# Patient Record
Sex: Female | Born: 1940 | Race: White | Hispanic: No | Smoking: Never smoker
Health system: Southern US, Community
[De-identification: ages and names within clinical notes are randomized; demographics above are authoritative.]

## PROBLEM LIST (undated history)

## (undated) DIAGNOSIS — I639 Cerebral infarction, unspecified: Secondary | ICD-10-CM

## (undated) DIAGNOSIS — I10 Essential (primary) hypertension: Secondary | ICD-10-CM

## (undated) DIAGNOSIS — F32A Depression, unspecified: Secondary | ICD-10-CM

## (undated) DIAGNOSIS — F329 Major depressive disorder, single episode, unspecified: Secondary | ICD-10-CM

## (undated) DIAGNOSIS — R011 Cardiac murmur, unspecified: Secondary | ICD-10-CM

## (undated) DIAGNOSIS — T7840XA Allergy, unspecified, initial encounter: Secondary | ICD-10-CM

## (undated) DIAGNOSIS — M1990 Unspecified osteoarthritis, unspecified site: Secondary | ICD-10-CM

## (undated) DIAGNOSIS — R51 Headache: Secondary | ICD-10-CM

## (undated) DIAGNOSIS — K589 Irritable bowel syndrome without diarrhea: Secondary | ICD-10-CM

## (undated) DIAGNOSIS — R55 Syncope and collapse: Secondary | ICD-10-CM

## (undated) DIAGNOSIS — Z923 Personal history of irradiation: Secondary | ICD-10-CM

## (undated) DIAGNOSIS — E119 Type 2 diabetes mellitus without complications: Secondary | ICD-10-CM

## (undated) DIAGNOSIS — Z86010 Personal history of colonic polyps: Secondary | ICD-10-CM

## (undated) DIAGNOSIS — K219 Gastro-esophageal reflux disease without esophagitis: Secondary | ICD-10-CM

## (undated) DIAGNOSIS — N390 Urinary tract infection, site not specified: Secondary | ICD-10-CM

## (undated) DIAGNOSIS — G4730 Sleep apnea, unspecified: Secondary | ICD-10-CM

## (undated) DIAGNOSIS — H269 Unspecified cataract: Secondary | ICD-10-CM

## (undated) DIAGNOSIS — C44320 Squamous cell carcinoma of skin of unspecified parts of face: Secondary | ICD-10-CM

## (undated) DIAGNOSIS — E669 Obesity, unspecified: Secondary | ICD-10-CM

## (undated) DIAGNOSIS — H33319 Horseshoe tear of retina without detachment, unspecified eye: Secondary | ICD-10-CM

## (undated) DIAGNOSIS — R42 Dizziness and giddiness: Secondary | ICD-10-CM

## (undated) DIAGNOSIS — F419 Anxiety disorder, unspecified: Secondary | ICD-10-CM

## (undated) DIAGNOSIS — T8859XA Other complications of anesthesia, initial encounter: Secondary | ICD-10-CM

## (undated) DIAGNOSIS — I499 Cardiac arrhythmia, unspecified: Secondary | ICD-10-CM

## (undated) DIAGNOSIS — L309 Dermatitis, unspecified: Secondary | ICD-10-CM

## (undated) DIAGNOSIS — Z8619 Personal history of other infectious and parasitic diseases: Secondary | ICD-10-CM

## (undated) DIAGNOSIS — E785 Hyperlipidemia, unspecified: Secondary | ICD-10-CM

## (undated) DIAGNOSIS — D649 Anemia, unspecified: Secondary | ICD-10-CM

## (undated) DIAGNOSIS — J45909 Unspecified asthma, uncomplicated: Secondary | ICD-10-CM

## (undated) DIAGNOSIS — K8020 Calculus of gallbladder without cholecystitis without obstruction: Secondary | ICD-10-CM

## (undated) DIAGNOSIS — H3530 Unspecified macular degeneration: Secondary | ICD-10-CM

## (undated) DIAGNOSIS — T4145XA Adverse effect of unspecified anesthetic, initial encounter: Secondary | ICD-10-CM

## (undated) HISTORY — PX: APPENDECTOMY: SHX54

## (undated) HISTORY — DX: Irritable bowel syndrome, unspecified: K58.9

## (undated) HISTORY — DX: Syncope and collapse: R55

## (undated) HISTORY — DX: Cardiac murmur, unspecified: R01.1

## (undated) HISTORY — DX: Allergy, unspecified, initial encounter: T78.40XA

## (undated) HISTORY — DX: Obesity, unspecified: E66.9

## (undated) HISTORY — DX: Unspecified macular degeneration: H35.30

## (undated) HISTORY — DX: Horseshoe tear of retina without detachment, unspecified eye: H33.319

## (undated) HISTORY — DX: Gastro-esophageal reflux disease without esophagitis: K21.9

## (undated) HISTORY — PX: MRI: SHX5353

## (undated) HISTORY — DX: Anxiety disorder, unspecified: F41.9

## (undated) HISTORY — DX: Hyperlipidemia, unspecified: E78.5

## (undated) HISTORY — DX: Type 2 diabetes mellitus without complications: E11.9

## (undated) HISTORY — DX: Unspecified osteoarthritis, unspecified site: M19.90

## (undated) HISTORY — DX: Major depressive disorder, single episode, unspecified: F32.9

## (undated) HISTORY — PX: CARPAL TUNNEL RELEASE: SHX101

## (undated) HISTORY — DX: Calculus of gallbladder without cholecystitis without obstruction: K80.20

## (undated) HISTORY — PX: CHOLECYSTECTOMY: SHX55

## (undated) HISTORY — DX: Cerebral infarction, unspecified: I63.9

## (undated) HISTORY — DX: Anemia, unspecified: D64.9

## (undated) HISTORY — DX: Depression, unspecified: F32.A

## (undated) HISTORY — DX: Squamous cell carcinoma of skin of unspecified parts of face: C44.320

## (undated) HISTORY — PX: LUMBAR DISC SURGERY: SHX700

## (undated) HISTORY — DX: Personal history of other infectious and parasitic diseases: Z86.19

## (undated) HISTORY — DX: Unspecified cataract: H26.9

## (undated) HISTORY — DX: Cardiac arrhythmia, unspecified: I49.9

## (undated) HISTORY — DX: Urinary tract infection, site not specified: N39.0

## (undated) HISTORY — PX: COLONOSCOPY: SHX174

## (undated) HISTORY — PX: UPPER GASTROINTESTINAL ENDOSCOPY: SHX188

## (undated) HISTORY — DX: Sleep apnea, unspecified: G47.30

## (undated) HISTORY — DX: Personal history of colonic polyps: Z86.010

## (undated) HISTORY — DX: Headache: R51

## (undated) HISTORY — PX: BREAST CYST ASPIRATION: SHX578

## (undated) HISTORY — DX: Unspecified asthma, uncomplicated: J45.909

## (undated) HISTORY — DX: Dermatitis, unspecified: L30.9

## (undated) HISTORY — PX: OTHER SURGICAL HISTORY: SHX169

## (undated) HISTORY — DX: Dizziness and giddiness: R42

## (undated) HISTORY — DX: Essential (primary) hypertension: I10

---

## 1976-05-18 HISTORY — PX: OTHER SURGICAL HISTORY: SHX169

## 1998-11-18 ENCOUNTER — Other Ambulatory Visit: Admission: RE | Admit: 1998-11-18 | Discharge: 1998-11-18 | Payer: Self-pay | Admitting: Family Medicine

## 1999-12-05 ENCOUNTER — Encounter: Payer: Self-pay | Admitting: Family Medicine

## 1999-12-05 ENCOUNTER — Encounter: Admission: RE | Admit: 1999-12-05 | Discharge: 1999-12-05 | Payer: Self-pay | Admitting: Family Medicine

## 1999-12-10 ENCOUNTER — Encounter: Admission: RE | Admit: 1999-12-10 | Discharge: 1999-12-10 | Payer: Self-pay | Admitting: Family Medicine

## 1999-12-10 ENCOUNTER — Encounter: Payer: Self-pay | Admitting: Family Medicine

## 1999-12-15 ENCOUNTER — Other Ambulatory Visit: Admission: RE | Admit: 1999-12-15 | Discharge: 1999-12-15 | Payer: Self-pay | Admitting: Family Medicine

## 2000-11-22 ENCOUNTER — Other Ambulatory Visit: Admission: RE | Admit: 2000-11-22 | Discharge: 2000-11-22 | Payer: Self-pay | Admitting: Family Medicine

## 2000-12-13 ENCOUNTER — Encounter: Payer: Self-pay | Admitting: Family Medicine

## 2000-12-13 ENCOUNTER — Encounter: Admission: RE | Admit: 2000-12-13 | Discharge: 2000-12-13 | Payer: Self-pay | Admitting: *Deleted

## 2001-03-09 HISTORY — PX: OTHER SURGICAL HISTORY: SHX169

## 2001-07-11 HISTORY — PX: OTHER SURGICAL HISTORY: SHX169

## 2001-11-30 ENCOUNTER — Other Ambulatory Visit: Admission: RE | Admit: 2001-11-30 | Discharge: 2001-11-30 | Payer: Self-pay | Admitting: Family Medicine

## 2001-12-14 ENCOUNTER — Encounter: Admission: RE | Admit: 2001-12-14 | Discharge: 2001-12-14 | Payer: Self-pay | Admitting: Family Medicine

## 2001-12-14 ENCOUNTER — Encounter: Payer: Self-pay | Admitting: Family Medicine

## 2001-12-19 ENCOUNTER — Encounter: Admission: RE | Admit: 2001-12-19 | Discharge: 2001-12-19 | Payer: Self-pay | Admitting: Family Medicine

## 2001-12-19 ENCOUNTER — Encounter: Payer: Self-pay | Admitting: Family Medicine

## 2002-07-25 ENCOUNTER — Encounter: Payer: Self-pay | Admitting: Neurosurgery

## 2002-07-28 ENCOUNTER — Encounter: Payer: Self-pay | Admitting: Neurosurgery

## 2002-07-28 ENCOUNTER — Inpatient Hospital Stay (HOSPITAL_COMMUNITY): Admission: RE | Admit: 2002-07-28 | Discharge: 2002-07-30 | Payer: Self-pay | Admitting: Neurosurgery

## 2002-09-05 ENCOUNTER — Encounter: Payer: Self-pay | Admitting: Neurosurgery

## 2002-09-05 ENCOUNTER — Encounter: Admission: RE | Admit: 2002-09-05 | Discharge: 2002-09-05 | Payer: Self-pay | Admitting: Neurosurgery

## 2002-11-07 ENCOUNTER — Encounter: Payer: Self-pay | Admitting: Neurosurgery

## 2002-11-07 ENCOUNTER — Encounter: Admission: RE | Admit: 2002-11-07 | Discharge: 2002-11-07 | Payer: Self-pay | Admitting: Neurosurgery

## 2003-01-17 ENCOUNTER — Encounter: Payer: Self-pay | Admitting: Family Medicine

## 2003-01-17 ENCOUNTER — Encounter: Admission: RE | Admit: 2003-01-17 | Discharge: 2003-01-17 | Payer: Self-pay | Admitting: Family Medicine

## 2003-01-29 ENCOUNTER — Other Ambulatory Visit: Admission: RE | Admit: 2003-01-29 | Discharge: 2003-01-29 | Payer: Self-pay | Admitting: Family Medicine

## 2003-02-21 ENCOUNTER — Encounter: Payer: Self-pay | Admitting: Internal Medicine

## 2003-03-19 HISTORY — PX: ESOPHAGOGASTRODUODENOSCOPY: SHX1529

## 2003-03-22 ENCOUNTER — Encounter: Payer: Self-pay | Admitting: Internal Medicine

## 2004-03-17 ENCOUNTER — Ambulatory Visit: Payer: Self-pay | Admitting: Family Medicine

## 2004-04-22 ENCOUNTER — Ambulatory Visit: Payer: Self-pay | Admitting: Family Medicine

## 2004-07-17 ENCOUNTER — Ambulatory Visit: Payer: Self-pay | Admitting: Internal Medicine

## 2004-08-01 ENCOUNTER — Ambulatory Visit: Payer: Self-pay | Admitting: Family Medicine

## 2004-11-03 ENCOUNTER — Ambulatory Visit: Payer: Self-pay | Admitting: Family Medicine

## 2004-11-19 ENCOUNTER — Ambulatory Visit: Payer: Self-pay | Admitting: Family Medicine

## 2005-01-29 ENCOUNTER — Ambulatory Visit: Payer: Self-pay | Admitting: Family Medicine

## 2005-03-05 ENCOUNTER — Ambulatory Visit: Payer: Self-pay | Admitting: Family Medicine

## 2005-03-24 ENCOUNTER — Ambulatory Visit: Payer: Self-pay | Admitting: Family Medicine

## 2005-05-18 DIAGNOSIS — G8929 Other chronic pain: Secondary | ICD-10-CM

## 2005-05-18 DIAGNOSIS — R519 Headache, unspecified: Secondary | ICD-10-CM

## 2005-05-18 DIAGNOSIS — R42 Dizziness and giddiness: Secondary | ICD-10-CM

## 2005-05-18 HISTORY — DX: Headache, unspecified: R51.9

## 2005-05-18 HISTORY — DX: Other chronic pain: G89.29

## 2005-05-18 HISTORY — DX: Dizziness and giddiness: R42

## 2005-05-26 ENCOUNTER — Ambulatory Visit: Payer: Self-pay | Admitting: Family Medicine

## 2005-09-15 HISTORY — PX: OTHER SURGICAL HISTORY: SHX169

## 2005-09-22 ENCOUNTER — Encounter: Payer: Self-pay | Admitting: Family Medicine

## 2005-09-22 ENCOUNTER — Ambulatory Visit: Payer: Self-pay | Admitting: Family Medicine

## 2005-09-22 ENCOUNTER — Other Ambulatory Visit: Admission: RE | Admit: 2005-09-22 | Discharge: 2005-09-22 | Payer: Self-pay | Admitting: Family Medicine

## 2005-09-22 LAB — CONVERTED CEMR LAB: Hgb A1c MFr Bld: 5.8 %

## 2005-10-02 ENCOUNTER — Encounter: Admission: RE | Admit: 2005-10-02 | Discharge: 2005-10-02 | Payer: Self-pay | Admitting: Family Medicine

## 2005-10-15 ENCOUNTER — Ambulatory Visit: Payer: Self-pay | Admitting: Family Medicine

## 2005-10-19 ENCOUNTER — Encounter: Admission: RE | Admit: 2005-10-19 | Discharge: 2005-10-19 | Payer: Self-pay | Admitting: Neurology

## 2006-02-17 ENCOUNTER — Ambulatory Visit (HOSPITAL_BASED_OUTPATIENT_CLINIC_OR_DEPARTMENT_OTHER): Admission: RE | Admit: 2006-02-17 | Discharge: 2006-02-17 | Payer: Self-pay | Admitting: *Deleted

## 2006-03-23 ENCOUNTER — Ambulatory Visit: Payer: Self-pay | Admitting: Family Medicine

## 2006-03-24 ENCOUNTER — Ambulatory Visit: Payer: Self-pay | Admitting: Family Medicine

## 2006-04-19 ENCOUNTER — Ambulatory Visit: Payer: Self-pay | Admitting: Family Medicine

## 2006-10-01 ENCOUNTER — Encounter: Payer: Self-pay | Admitting: Family Medicine

## 2006-10-01 DIAGNOSIS — M549 Dorsalgia, unspecified: Secondary | ICD-10-CM | POA: Insufficient documentation

## 2006-10-01 DIAGNOSIS — Z87898 Personal history of other specified conditions: Secondary | ICD-10-CM

## 2006-10-01 DIAGNOSIS — E785 Hyperlipidemia, unspecified: Secondary | ICD-10-CM | POA: Insufficient documentation

## 2006-10-01 DIAGNOSIS — M4800 Spinal stenosis, site unspecified: Secondary | ICD-10-CM | POA: Insufficient documentation

## 2006-10-01 DIAGNOSIS — I10 Essential (primary) hypertension: Secondary | ICD-10-CM | POA: Insufficient documentation

## 2006-10-01 DIAGNOSIS — K589 Irritable bowel syndrome without diarrhea: Secondary | ICD-10-CM

## 2006-10-01 DIAGNOSIS — M1990 Unspecified osteoarthritis, unspecified site: Secondary | ICD-10-CM | POA: Insufficient documentation

## 2006-10-01 DIAGNOSIS — D179 Benign lipomatous neoplasm, unspecified: Secondary | ICD-10-CM | POA: Insufficient documentation

## 2006-10-01 DIAGNOSIS — G5600 Carpal tunnel syndrome, unspecified upper limb: Secondary | ICD-10-CM | POA: Insufficient documentation

## 2006-10-01 DIAGNOSIS — N6019 Diffuse cystic mastopathy of unspecified breast: Secondary | ICD-10-CM

## 2006-10-14 ENCOUNTER — Ambulatory Visit: Payer: Self-pay | Admitting: Family Medicine

## 2006-10-15 LAB — CONVERTED CEMR LAB
ALT: 16 units/L (ref 0–40)
AST: 23 units/L (ref 0–37)
Basophils Absolute: 0 10*3/uL (ref 0.0–0.1)
Chloride: 104 meq/L (ref 96–112)
Cholesterol: 168 mg/dL (ref 0–200)
Creatinine, Ser: 0.8 mg/dL (ref 0.4–1.2)
Direct LDL: 84.3 mg/dL
Eosinophils Relative: 3.3 % (ref 0.0–5.0)
HCT: 36.2 % (ref 36.0–46.0)
HDL: 41.4 mg/dL (ref >39.0)
Hemoglobin: 12.8 g/dL (ref 12.0–15.0)
Lymphocytes Relative: 19.8 % (ref 12.0–46.0)
MCHC: 35.3 g/dL (ref 30.0–36.0)
MCV: 84.3 fL (ref 78.0–100.0)
Monocytes Relative: 7 % (ref 3.0–11.0)
Neutro Abs: 7.5 10*3/uL (ref 1.4–7.7)
RDW: 12.9 % (ref 11.5–14.6)
TSH: 1.92 microintl units/mL (ref 0.35–5.50)
Total Bilirubin: 0.6 mg/dL (ref 0.3–1.2)
Total CHOL/HDL Ratio: 4.1
Total Protein: 7.2 g/dL (ref 6.0–8.3)
VLDL: 50 mg/dL — ABNORMAL HIGH (ref 0–40)
WBC: 10.9 10*3/uL — ABNORMAL HIGH (ref 4.5–10.5)

## 2006-10-18 ENCOUNTER — Encounter: Admission: RE | Admit: 2006-10-18 | Discharge: 2006-10-18 | Payer: Self-pay | Admitting: Family Medicine

## 2006-10-27 ENCOUNTER — Encounter (INDEPENDENT_AMBULATORY_CARE_PROVIDER_SITE_OTHER): Payer: Self-pay | Admitting: *Deleted

## 2007-01-13 ENCOUNTER — Ambulatory Visit: Payer: Self-pay | Admitting: Family Medicine

## 2007-01-14 LAB — CONVERTED CEMR LAB
ALT: 18 units/L (ref 0–35)
Albumin: 3.7 g/dL (ref 3.5–5.2)
BUN: 16 mg/dL (ref 6–23)
Basophils Absolute: 0.1 10*3/uL (ref 0.0–0.1)
Basophils Relative: 0.7 % (ref 0.0–1.0)
CO2: 30 meq/L (ref 19–32)
Calcium: 9.6 mg/dL (ref 8.4–10.5)
Chloride: 102 meq/L (ref 96–112)
Creatinine, Ser: 1 mg/dL (ref 0.4–1.2)
Eosinophils Relative: 4.7 % (ref 0.0–5.0)
HCT: 34.9 % — ABNORMAL LOW (ref 36.0–46.0)
Hemoglobin: 12.1 g/dL (ref 12.0–15.0)
Hgb A1c MFr Bld: 6.3 % — ABNORMAL HIGH (ref 4.6–6.0)
MCHC: 34.8 g/dL (ref 30.0–36.0)
Monocytes Absolute: 0.6 10*3/uL (ref 0.2–0.7)
Neutro Abs: 5.6 10*3/uL (ref 1.4–7.7)
Platelets: 295 10*3/uL (ref 150–400)
Potassium: 4.1 meq/L (ref 3.5–5.1)
RDW: 12.9 % (ref 11.5–14.6)
Sodium: 140 meq/L (ref 135–145)
TSH: 1.98 microintl units/mL (ref 0.35–5.50)
WBC: 8.4 10*3/uL (ref 4.5–10.5)

## 2007-02-15 ENCOUNTER — Telehealth (INDEPENDENT_AMBULATORY_CARE_PROVIDER_SITE_OTHER): Payer: Self-pay | Admitting: *Deleted

## 2007-05-23 ENCOUNTER — Ambulatory Visit: Payer: Self-pay | Admitting: Family Medicine

## 2007-05-26 ENCOUNTER — Telehealth: Payer: Self-pay | Admitting: Family Medicine

## 2007-06-08 ENCOUNTER — Telehealth: Payer: Self-pay | Admitting: Family Medicine

## 2007-09-02 ENCOUNTER — Telehealth: Payer: Self-pay | Admitting: Family Medicine

## 2007-09-08 ENCOUNTER — Ambulatory Visit: Payer: Self-pay | Admitting: Family Medicine

## 2007-09-09 LAB — CONVERTED CEMR LAB
ALT: 20 units/L (ref 0–35)
Albumin: 4 g/dL (ref 3.5–5.2)
BUN: 18 mg/dL (ref 6–23)
Bilirubin, Direct: 0.1 mg/dL (ref 0.0–0.3)
Calcium: 9.8 mg/dL (ref 8.4–10.5)
Creatinine, Ser: 1 mg/dL (ref 0.4–1.2)
GFR calc Af Amer: 72 mL/min
GFR calc non Af Amer: 59 mL/min
Glucose, Bld: 100 mg/dL — ABNORMAL HIGH (ref 70–99)
Hgb A1c MFr Bld: 6.2 % — ABNORMAL HIGH (ref 4.6–6.0)
Phosphorus: 4 mg/dL (ref 2.3–4.6)
Potassium: 4 meq/L (ref 3.5–5.1)
Sodium: 141 meq/L (ref 135–145)
Total Protein: 7 g/dL (ref 6.0–8.3)

## 2007-10-26 ENCOUNTER — Ambulatory Visit: Payer: Self-pay | Admitting: Family Medicine

## 2007-10-26 DIAGNOSIS — R7303 Prediabetes: Secondary | ICD-10-CM

## 2007-11-14 ENCOUNTER — Encounter: Admission: RE | Admit: 2007-11-14 | Discharge: 2007-11-14 | Payer: Self-pay | Admitting: Family Medicine

## 2007-11-22 ENCOUNTER — Encounter: Admission: RE | Admit: 2007-11-22 | Discharge: 2007-11-22 | Payer: Self-pay | Admitting: Family Medicine

## 2007-11-23 ENCOUNTER — Encounter (INDEPENDENT_AMBULATORY_CARE_PROVIDER_SITE_OTHER): Payer: Self-pay | Admitting: *Deleted

## 2008-04-17 ENCOUNTER — Ambulatory Visit: Payer: Self-pay | Admitting: Family Medicine

## 2008-04-20 LAB — CONVERTED CEMR LAB
Albumin: 3.6 g/dL (ref 3.5–5.2)
BUN: 23 mg/dL (ref 6–23)
Calcium: 9.2 mg/dL (ref 8.4–10.5)
Creatinine, Ser: 1.1 mg/dL (ref 0.4–1.2)
Creatinine,U: 385.6 mg/dL
Eosinophils Absolute: 0.5 10*3/uL (ref 0.0–0.7)
Eosinophils Relative: 5.3 % — ABNORMAL HIGH (ref 0.0–5.0)
GFR calc Af Amer: 64 mL/min
GFR calc non Af Amer: 53 mL/min
Glucose, Bld: 104 mg/dL — ABNORMAL HIGH (ref 70–99)
HCT: 35.4 % — ABNORMAL LOW (ref 36.0–46.0)
HDL: 36.7 mg/dL — ABNORMAL LOW (ref >39.0)
Hgb A1c MFr Bld: 6.3 % — ABNORMAL HIGH (ref 4.6–6.0)
MCHC: 34.3 g/dL (ref 30.0–36.0)
Microalb Creat Ratio: 24.9 mg/g (ref 0.0–30.0)
Microalb, Ur: 9.6 mg/dL — ABNORMAL HIGH (ref 0.0–1.9)
Potassium: 3.7 meq/L (ref 3.5–5.1)
RBC: 4.19 M/uL (ref 3.87–5.11)
Total Bilirubin: 0.6 mg/dL (ref 0.3–1.2)
Total Protein: 6.9 g/dL (ref 6.0–8.3)
WBC: 10.2 10*3/uL (ref 4.5–10.5)

## 2008-04-23 ENCOUNTER — Ambulatory Visit: Payer: Self-pay | Admitting: Family Medicine

## 2008-04-25 ENCOUNTER — Encounter: Payer: Self-pay | Admitting: Family Medicine

## 2008-04-26 ENCOUNTER — Encounter: Payer: Self-pay | Admitting: Family Medicine

## 2008-08-15 ENCOUNTER — Telehealth: Payer: Self-pay | Admitting: Family Medicine

## 2008-10-24 ENCOUNTER — Ambulatory Visit: Payer: Self-pay | Admitting: Family Medicine

## 2008-10-26 ENCOUNTER — Telehealth (INDEPENDENT_AMBULATORY_CARE_PROVIDER_SITE_OTHER): Payer: Self-pay | Admitting: *Deleted

## 2008-10-26 LAB — CONVERTED CEMR LAB
AST: 21 units/L (ref 0–37)
Albumin: 3.5 g/dL (ref 3.5–5.2)
BUN: 20 mg/dL (ref 6–23)
Calcium: 9 mg/dL (ref 8.4–10.5)
Creatinine, Ser: 1 mg/dL (ref 0.4–1.2)
Glucose, Bld: 104 mg/dL — ABNORMAL HIGH (ref 70–99)
Hgb A1c MFr Bld: 6.4 % (ref 4.6–6.5)
Phosphorus: 3.6 mg/dL (ref 2.3–4.6)
Sodium: 141 meq/L (ref 135–145)

## 2008-11-30 ENCOUNTER — Telehealth: Payer: Self-pay | Admitting: Family Medicine

## 2009-04-24 ENCOUNTER — Ambulatory Visit: Payer: Self-pay | Admitting: Family Medicine

## 2009-04-25 LAB — CONVERTED CEMR LAB
ALT: 17 units/L (ref 0–35)
AST: 22 units/L (ref 0–37)
Albumin: 3.7 g/dL (ref 3.5–5.2)
BUN: 20 mg/dL (ref 6–23)
Basophils Absolute: 0.1 10*3/uL (ref 0.0–0.1)
Basophils Relative: 0.8 % (ref 0.0–3.0)
Glucose, Bld: 102 mg/dL — ABNORMAL HIGH (ref 70–99)
Hemoglobin: 11.5 g/dL — ABNORMAL LOW (ref 12.0–15.0)
MCHC: 33.7 g/dL (ref 30.0–36.0)
MCV: 82.4 fL (ref 78.0–100.0)
Microalb, Ur: 1.6 mg/dL (ref 0.0–1.9)
Neutro Abs: 7 10*3/uL (ref 1.4–7.7)
Phosphorus: 4.2 mg/dL (ref 2.3–4.6)
Potassium: 4 meq/L (ref 3.5–5.1)
RDW: 13.9 % (ref 11.5–14.6)
Sodium: 144 meq/L (ref 135–145)
TSH: 2.14 microintl units/mL (ref 0.35–5.50)
Triglycerides: 245 mg/dL — ABNORMAL HIGH (ref 0.0–149.0)

## 2009-04-30 ENCOUNTER — Ambulatory Visit: Payer: Self-pay | Admitting: Family Medicine

## 2009-04-30 ENCOUNTER — Other Ambulatory Visit: Admission: RE | Admit: 2009-04-30 | Discharge: 2009-04-30 | Payer: Self-pay | Admitting: Family Medicine

## 2009-04-30 DIAGNOSIS — R413 Other amnesia: Secondary | ICD-10-CM

## 2009-04-30 LAB — HM PAP SMEAR

## 2009-05-07 ENCOUNTER — Ambulatory Visit: Payer: Self-pay | Admitting: Family Medicine

## 2009-05-08 ENCOUNTER — Encounter (INDEPENDENT_AMBULATORY_CARE_PROVIDER_SITE_OTHER): Payer: Self-pay | Admitting: *Deleted

## 2009-05-08 ENCOUNTER — Telehealth: Payer: Self-pay | Admitting: Family Medicine

## 2009-05-14 ENCOUNTER — Encounter: Admission: RE | Admit: 2009-05-14 | Discharge: 2009-05-14 | Payer: Self-pay | Admitting: Family Medicine

## 2009-05-15 ENCOUNTER — Ambulatory Visit: Payer: Self-pay | Admitting: Family Medicine

## 2009-05-15 DIAGNOSIS — F419 Anxiety disorder, unspecified: Secondary | ICD-10-CM | POA: Insufficient documentation

## 2009-05-15 DIAGNOSIS — F329 Major depressive disorder, single episode, unspecified: Secondary | ICD-10-CM | POA: Insufficient documentation

## 2009-05-16 ENCOUNTER — Encounter: Payer: Self-pay | Admitting: Family Medicine

## 2009-05-20 ENCOUNTER — Encounter (INDEPENDENT_AMBULATORY_CARE_PROVIDER_SITE_OTHER): Payer: Self-pay | Admitting: *Deleted

## 2009-05-20 LAB — CONVERTED CEMR LAB
Folate: 12.5 ng/mL
Sed Rate: 51 mm/h — ABNORMAL HIGH
Vit D, 25-Hydroxy: 21 ng/mL — ABNORMAL LOW
Vitamin B-12: 193 pg/mL — ABNORMAL LOW

## 2009-05-20 LAB — HM MAMMOGRAPHY: HM Mammogram: NORMAL

## 2009-05-23 ENCOUNTER — Encounter: Payer: Self-pay | Admitting: Family Medicine

## 2009-05-24 ENCOUNTER — Ambulatory Visit: Payer: Self-pay | Admitting: Family Medicine

## 2009-05-24 DIAGNOSIS — D518 Other vitamin B12 deficiency anemias: Secondary | ICD-10-CM | POA: Insufficient documentation

## 2009-05-30 ENCOUNTER — Ambulatory Visit: Payer: Self-pay | Admitting: Family Medicine

## 2009-05-31 ENCOUNTER — Ambulatory Visit: Payer: Self-pay | Admitting: Pulmonary Disease

## 2009-05-31 DIAGNOSIS — G4733 Obstructive sleep apnea (adult) (pediatric): Secondary | ICD-10-CM | POA: Insufficient documentation

## 2009-06-05 ENCOUNTER — Ambulatory Visit: Payer: Self-pay | Admitting: Internal Medicine

## 2009-06-05 ENCOUNTER — Encounter (INDEPENDENT_AMBULATORY_CARE_PROVIDER_SITE_OTHER): Payer: Self-pay | Admitting: *Deleted

## 2009-06-05 LAB — CONVERTED CEMR LAB
Ferritin: 8.4 ng/mL — ABNORMAL LOW (ref 10.0–291.0)
Saturation Ratios: 6.2 % — ABNORMAL LOW (ref 20.0–50.0)

## 2009-06-06 ENCOUNTER — Ambulatory Visit: Payer: Self-pay | Admitting: Family Medicine

## 2009-06-07 ENCOUNTER — Ambulatory Visit: Payer: Self-pay | Admitting: Internal Medicine

## 2009-06-07 DIAGNOSIS — Z860101 Personal history of adenomatous and serrated colon polyps: Secondary | ICD-10-CM | POA: Insufficient documentation

## 2009-06-07 DIAGNOSIS — Z86010 Personal history of colonic polyps: Secondary | ICD-10-CM

## 2009-06-07 HISTORY — DX: Personal history of adenomatous and serrated colon polyps: Z86.0101

## 2009-06-07 HISTORY — DX: Personal history of colonic polyps: Z86.010

## 2009-06-10 ENCOUNTER — Ambulatory Visit (HOSPITAL_BASED_OUTPATIENT_CLINIC_OR_DEPARTMENT_OTHER): Admission: RE | Admit: 2009-06-10 | Discharge: 2009-06-10 | Payer: Self-pay | Admitting: Pulmonary Disease

## 2009-06-10 ENCOUNTER — Encounter: Payer: Self-pay | Admitting: Pulmonary Disease

## 2009-06-11 ENCOUNTER — Encounter (INDEPENDENT_AMBULATORY_CARE_PROVIDER_SITE_OTHER): Payer: Self-pay | Admitting: *Deleted

## 2009-06-12 ENCOUNTER — Telehealth: Payer: Self-pay | Admitting: Family Medicine

## 2009-06-12 ENCOUNTER — Ambulatory Visit: Payer: Self-pay | Admitting: Internal Medicine

## 2009-06-13 ENCOUNTER — Ambulatory Visit: Payer: Self-pay | Admitting: Family Medicine

## 2009-06-24 ENCOUNTER — Ambulatory Visit: Payer: Self-pay | Admitting: Internal Medicine

## 2009-06-25 ENCOUNTER — Encounter: Payer: Self-pay | Admitting: Internal Medicine

## 2009-06-26 ENCOUNTER — Ambulatory Visit: Payer: Self-pay | Admitting: Pulmonary Disease

## 2009-06-26 ENCOUNTER — Telehealth (INDEPENDENT_AMBULATORY_CARE_PROVIDER_SITE_OTHER): Payer: Self-pay | Admitting: *Deleted

## 2009-06-27 ENCOUNTER — Ambulatory Visit: Payer: Self-pay | Admitting: Family Medicine

## 2009-06-27 LAB — CONVERTED CEMR LAB: Vitamin B-12: 402 pg/mL (ref 211–911)

## 2009-07-03 ENCOUNTER — Telehealth: Payer: Self-pay | Admitting: Family Medicine

## 2009-07-03 ENCOUNTER — Ambulatory Visit: Payer: Self-pay | Admitting: Pulmonary Disease

## 2009-07-03 DIAGNOSIS — G2589 Other specified extrapyramidal and movement disorders: Secondary | ICD-10-CM | POA: Insufficient documentation

## 2009-07-09 ENCOUNTER — Ambulatory Visit: Payer: Self-pay | Admitting: Family Medicine

## 2009-07-09 DIAGNOSIS — D509 Iron deficiency anemia, unspecified: Secondary | ICD-10-CM

## 2009-07-16 HISTORY — PX: RETINAL TEAR REPAIR CRYOTHERAPY: SHX5304

## 2009-07-30 LAB — HM DIABETES EYE EXAM: HM Diabetic Eye Exam: NORMAL

## 2009-08-06 ENCOUNTER — Ambulatory Visit: Payer: Self-pay | Admitting: Family Medicine

## 2009-08-06 ENCOUNTER — Telehealth: Payer: Self-pay | Admitting: Family Medicine

## 2009-08-06 ENCOUNTER — Telehealth: Payer: Self-pay | Admitting: Internal Medicine

## 2009-08-07 ENCOUNTER — Ambulatory Visit: Payer: Self-pay | Admitting: Family Medicine

## 2009-08-11 LAB — CONVERTED CEMR LAB
Basophils Absolute: 0 10*3/uL (ref 0.0–0.1)
Basophils Relative: 0 % (ref 0.0–3.0)
Eosinophils Relative: 3.9 % (ref 0.0–5.0)
Ferritin: 14 ng/mL (ref 10.0–291.0)
Hemoglobin: 11.5 g/dL — ABNORMAL LOW (ref 12.0–15.0)
Lymphocytes Relative: 19.1 % (ref 12.0–46.0)
MCHC: 33 g/dL (ref 30.0–36.0)
Neutro Abs: 7.6 10*3/uL (ref 1.4–7.7)
Neutrophils Relative %: 72.7 % (ref 43.0–77.0)
Platelets: 306 10*3/uL (ref 150.0–400.0)
RBC: 4.12 M/uL (ref 3.87–5.11)
RDW: 16.4 % — ABNORMAL HIGH (ref 11.5–14.6)
Sed Rate: 43 mm/hr — ABNORMAL HIGH (ref 0–22)
WBC: 10.5 10*3/uL (ref 4.5–10.5)

## 2009-08-21 ENCOUNTER — Ambulatory Visit: Payer: Self-pay | Admitting: Family Medicine

## 2009-08-21 ENCOUNTER — Telehealth: Payer: Self-pay | Admitting: Family Medicine

## 2009-08-21 DIAGNOSIS — J309 Allergic rhinitis, unspecified: Secondary | ICD-10-CM

## 2009-08-26 LAB — CONVERTED CEMR LAB: Vitamin B-12: 439 pg/mL (ref 211–911)

## 2009-08-30 ENCOUNTER — Inpatient Hospital Stay (HOSPITAL_COMMUNITY): Admission: RE | Admit: 2009-08-30 | Discharge: 2009-08-31 | Payer: Self-pay | Admitting: Neurosurgery

## 2009-10-10 ENCOUNTER — Telehealth: Payer: Self-pay | Admitting: Family Medicine

## 2009-10-24 ENCOUNTER — Encounter (INDEPENDENT_AMBULATORY_CARE_PROVIDER_SITE_OTHER): Payer: Self-pay | Admitting: *Deleted

## 2009-10-30 ENCOUNTER — Encounter: Payer: Self-pay | Admitting: Family Medicine

## 2009-11-14 ENCOUNTER — Ambulatory Visit: Payer: Self-pay | Admitting: Family Medicine

## 2009-11-20 ENCOUNTER — Telehealth (INDEPENDENT_AMBULATORY_CARE_PROVIDER_SITE_OTHER): Payer: Self-pay

## 2009-11-27 ENCOUNTER — Encounter: Payer: Self-pay | Admitting: Family Medicine

## 2009-12-24 ENCOUNTER — Ambulatory Visit: Payer: Self-pay | Admitting: Family Medicine

## 2010-01-09 ENCOUNTER — Telehealth: Payer: Self-pay | Admitting: Family Medicine

## 2010-01-28 ENCOUNTER — Ambulatory Visit: Payer: Self-pay | Admitting: Family Medicine

## 2010-01-30 ENCOUNTER — Encounter: Payer: Self-pay | Admitting: Family Medicine

## 2010-03-11 ENCOUNTER — Ambulatory Visit: Payer: Self-pay | Admitting: Family Medicine

## 2010-03-13 ENCOUNTER — Telehealth: Payer: Self-pay | Admitting: Internal Medicine

## 2010-03-25 ENCOUNTER — Encounter (INDEPENDENT_AMBULATORY_CARE_PROVIDER_SITE_OTHER): Payer: Self-pay | Admitting: *Deleted

## 2010-03-28 ENCOUNTER — Encounter (INDEPENDENT_AMBULATORY_CARE_PROVIDER_SITE_OTHER): Payer: Self-pay | Admitting: *Deleted

## 2010-04-02 ENCOUNTER — Ambulatory Visit: Payer: Self-pay | Admitting: Internal Medicine

## 2010-04-03 ENCOUNTER — Telehealth: Payer: Self-pay | Admitting: Family Medicine

## 2010-04-15 ENCOUNTER — Ambulatory Visit: Payer: Self-pay | Admitting: Internal Medicine

## 2010-04-15 ENCOUNTER — Ambulatory Visit (HOSPITAL_COMMUNITY)
Admission: RE | Admit: 2010-04-15 | Discharge: 2010-04-15 | Payer: Self-pay | Source: Home / Self Care | Admitting: Internal Medicine

## 2010-04-15 LAB — HM COLONOSCOPY

## 2010-04-20 ENCOUNTER — Telehealth: Payer: Self-pay | Admitting: Internal Medicine

## 2010-05-23 ENCOUNTER — Ambulatory Visit
Admission: RE | Admit: 2010-05-23 | Discharge: 2010-05-23 | Payer: Self-pay | Source: Home / Self Care | Attending: Family Medicine | Admitting: Family Medicine

## 2010-06-17 ENCOUNTER — Ambulatory Visit: Admit: 2010-06-17 | Payer: Self-pay | Admitting: Family Medicine

## 2010-06-17 NOTE — Progress Notes (Signed)
Summary: ? about Vit B12 level  Phone Note Call from Patient Call back at [phone]   Caller: Patient Call For: Dalayla Part MD Summary of Call: Pt requested result on Vit B12 level and how to f/u. Vit B 12 level was 402 on 06/27/09.Please advise.  Initial call taken by: Lewanda Rife LPN,  July 03, 2009 9:56 AM  Follow-up for Phone Call        B12 level is improved I want her to take a generic B complex vit otc daily also to continue B12 shots once monthly - please schedule  Follow-up by: Pretty Part MD,  July 03, 2009 1:47 PM  Additional Follow-up for Phone Call Additional follow up Details #1::        Patient Advised. Appointment scheduled  07/09/2009 to follow up with MAT at patient's request.  Will get B-12 shot at that time. Additional Follow-up by: Delilah Shan CMA (AAMA),  July 03, 2009 4:47 PM    New/Updated Medications: * VITAMIN B12 INJECTION take as directed once monthly * GENERIC OTC B COMPLEX VITAMIN 1 by mouth once daily
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
## 2010-06-17 NOTE — Assessment & Plan Note (Signed)
Summary: B-12 INJ/TOWER/CLE   Nurse Visit   Allergies: 1)  ! * Omega 3 Oils 2)  ! * Calcium 3)  ! * Vit D 4)  ! Neurontin 5)  ! Requip (Ropinirole Hcl) 6)  ! * Nasonex 7)  Ampicillin 8)  Codeine 9)  * Zyrtec 10)  * Phenylpropcinalone  Medication Administration  Injection # 1:    Medication: Vit B12 1000 mcg    Diagnosis: ANEMIA, B12 DEFICIENCY (ICD-281.1)    Route: IM    Site: L deltoid    Exp Date: 10/17/2011    Lot #: [id number]    Mfr: American Regent    Patient tolerated injection without complications    Given by: Sydell Axon LPN (March 11, 2010 11:51 AM)  Orders Added: 1)  Vit B12 1000 mcg [J3420] 2)  Admin of Therapeutic Inj  intramuscular or subcutaneous [[id number]]   Medication Administration  Injection # 1:    Medication: Vit B12 1000 mcg    Diagnosis: ANEMIA, B12 DEFICIENCY (ICD-281.1)    Route: IM    Site: L deltoid    Exp Date: 10/17/2011    Lot #: [id number]    Mfr: American Regent    Patient tolerated injection without complications    Given by: Sydell Axon LPN (March 11, 2010 11:51 AM)  Orders Added: 1)  Vit B12 1000 mcg [J3420] 2)  Admin of Therapeutic Inj  intramuscular or subcutaneous [[id number]]

## 2010-06-17 NOTE — Procedures (Signed)
Summary: Preparation for Colonoscopy / [facility]  Preparation for Colonoscopy / [facility]   Imported By: Lennie Odor 04/08/2010 09:09:51  _____________________________________________________________________  External Attachment:    Type:   Image     Comment:   External Document

## 2010-06-17 NOTE — Assessment & Plan Note (Signed)
Summary: B-12 INJ/TOWER/CLE   Nurse Visit   Allergies: 1)  ! * Omega 3 Oils 2)  ! * Calcium 3)  ! * Vit D 4)  ! Neurontin 5)  ! Requip (Ropinirole Hcl) 6)  ! * Nasonex 7)  Ampicillin 8)  Codeine 9)  * Zyrtec 10)  * Phenylpropcinalone  Medication Administration  Injection # 1:    Medication: Vit B12 1000 mcg    Diagnosis: ANEMIA, B12 DEFICIENCY (ICD-281.1)    Route: IM    Site: L deltoid    Exp Date: 04/18/2011    Lot #: [id number]    Mfr: American Regent    Patient tolerated injection without complications    Given by: Benny Lennert CMA (AAMA) (December 24, 2009 10:52 AM)  Orders Added: 1)  Admin of Therapeutic Inj  intramuscular or subcutaneous [[id number]]

## 2010-06-17 NOTE — Progress Notes (Signed)
Summary: refill request for lotrisone  Phone Note Refill Request Message from:  Fax from Pharmacy  Refills Requested: Medication #1:  LOTRISONE 1-0.05 % CREA apply to aff area once daily as needed Faxed request from [place].  Initial call taken by: Lowella Petties CMA,  January 09, 2010 10:11 AM  Follow-up for Phone Call        px written on EMR for call in  Follow-up by: Al Part MD,  January 09, 2010 10:46 AM  Additional Follow-up for Phone Call Additional follow up Details #1::        Rx sent to pharmacy electronically. Additional Follow-up by: Linde Gillis CMA Duncan Dull),  January 09, 2010 12:50 PM    Prescriptions: LOTRISONE 1-0.05 % CREA (CLOTRIMAZOLE-BETAMETHASONE) apply to aff area once daily as needed  #1 medium x 0   Entered by:   Linde Gillis CMA (AAMA)   Authorized by:   Shellye Part MD   Signed by:   Linde Gillis CMA (AAMA) on 01/09/2010   Method used:   Electronically to        [facility]* (retail)       [address]       [place], [place]  [address]       Ph: [phone]       Fax: [phone]   RxID:   [id number]
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
## 2010-06-17 NOTE — Progress Notes (Signed)
Summary: refill request for flexeril  Phone Note Refill Request Message from:  Fax from Pharmacy  Refills Requested: Medication #1:  FLEXERIL 10 MG TABS take one half to one by mouth three times a day prn   Last Refilled: 03/14/2009 Faxed request from [facility].  Initial call taken by: Lowella Petties CMA,  June 12, 2009 4:47 PM  Follow-up for Phone Call        px written on EMR for call in  Follow-up by: Tenae Part MD,  June 12, 2009 7:59 PM    Prescriptions: FLEXERIL 10 MG TABS (CYCLOBENZAPRINE HCL) take one half to one by mouth three times a day prn  #30 x 2   Entered by:   Lowella Petties CMA   Authorized by:   Taja Part MD   Signed by:   Lowella Petties CMA on 06/13/2009   Method used:   Electronically to        [facility]* (retail)       [address]       [place], [place]  [address]       Ph: [phone]       Fax: [phone]   RxID:   [id number] FLEXERIL 10 MG TABS (CYCLOBENZAPRINE HCL) take one half to one by mouth three times a day prn  #30 x 2   Entered and Authorized by:   Deonne Part MD   Signed by:   Marlisha Part MD on 06/12/2009   Method used:   Telephoned to ...       [facility]* (retail)       [address]       [place], [place]  [address]       Ph: [phone]       Fax: [phone]   RxID:   [id number]

## 2010-06-17 NOTE — Assessment & Plan Note (Signed)
Summary: B-12 SHOT   Nurse Visit   Allergies: 1)  ! * Omega 3 Oils 2)  ! * Calcium 3)  ! * Vit D 4)  Ampicillin 5)  Codeine 6)  * Zyrtec 7)  * Phenylpropcinalone  Medication Administration  Injection # 1:    Medication: Vit B12 1000 mcg    Diagnosis: ANEMIA, B12 DEFICIENCY (ICD-281.1)    Route: IM    Site: R deltoid    Exp Date: 09/2010    Lot #: [id number]    Mfr: American Regent    Patient tolerated injection without complications    Given by: Lowella Petties CMA (May 30, 2009 12:11 PM)  Orders Added: 1)  Vit B12 1000 mcg [J3420] 2)  Admin of Therapeutic Inj  intramuscular or subcutaneous [[id number]]   Medication Administration  Injection # 1:    Medication: Vit B12 1000 mcg    Diagnosis: ANEMIA, B12 DEFICIENCY (ICD-281.1)    Route: IM    Site: R deltoid    Exp Date: 09/2010    Lot #: [id number]    Mfr: American Regent    Patient tolerated injection without complications    Given by: Lowella Petties CMA (May 30, 2009 12:11 PM)  Orders Added: 1)  Vit B12 1000 mcg [J3420] 2)  Admin of Therapeutic Inj  intramuscular or subcutaneous [[id number]]

## 2010-06-17 NOTE — Miscellaneous (Signed)
Summary: one touch test strips  Medications Added ONETOUCH ULTRA TEST  STRP (GLUCOSE BLOOD) check blood sugar once daily as directed       Clinical Lists Changes  Medications: Added new medication of ONETOUCH ULTRA TEST  STRP (GLUCOSE BLOOD) check blood sugar once daily as directed - Signed Rx of ONETOUCH ULTRA TEST  STRP (GLUCOSE BLOOD) check blood sugar once daily as directed;  #100 x prn;  Signed;  Entered by: Lowella Petties CMA;  Authorized by: Nyelah Part MD;  Method used: Electronically to [facility]*, [address], [place], [place]  [address], Ph: [phone], Fax: [phone]    Prescriptions: [place] TEST  STRP (GLUCOSE BLOOD) check blood sugar once daily as directed  #100 x prn   Entered by:   Lowella Petties CMA   Authorized by:   Teeghan Part MD   Signed by:   Lowella Petties CMA on 05/23/2009   Method used:   Electronically to        [facility]* (retail)       [address]       [place], [place]  [address]       Ph: [phone]       Fax: [phone]   RxID:   [id number]   Prior Medications: MAXZIDE-25 37.5-25 MG TABS (TRIAMTERENE-HCTZ) Take 2 by mouth once daily in am ATENOLOL 25 MG TABS (ATENOLOL) Take 1/2 by mouth daily LOTREL 10-20 MG CAPS (AMLODIPINE BESY-BENAZEPRIL HCL) Take one by mouth daily CRESTOR 10 MG TABS (ROSUVASTATIN CALCIUM) Take 1/2 by mouth once daily RHINOCORT AQUA  SUSP (BUDESONIDE (NASAL) SUSP) 1 spray in each nostril once daily as needed CO Q10 100 MG TABS (COENZYME Q10) Take one by mouth tid ASPIRIN 81 MG TBEC (ASPIRIN) Take one by mouth daily MECLIZINE HCL 25 MG TABS (MECLIZINE HCL) take one by mouth three times a day prn FLEXERIL 10 MG TABS (CYCLOBENZAPRINE HCL) take one half to one by mouth three times a day prn ALBUTEROL  AERS (ALBUTEROL AERS) use as directed as needed 2 puffs q 4 hours IBUPROFEN  CAPS (IBUPROFEN CAPS) take 2 daily prn LOTRISONE 1-0.05 % CREA (CLOTRIMAZOLE-BETAMETHASONE) apply to aff area once  daily prn ALPRAZOLAM 0.5 MG  TABS (ALPRAZOLAM) 1/2 to 1 by mouth two times a day as needed severe anxiety VICODIN 5-500 MG  TABS (HYDROCODONE-ACETAMINOPHEN) 1 q6h as needed pain HYOSCYAMINE SULFATE CR 0.375 MG  CP12 (HYOSCYAMINE SULFATE) 1 by mouth two times a day as needed GLUCOSE TEST STRIPS AND LANCETS () to check glucose once daily and as needed for diabetes 250.0 CELEXA 20 MG TABS (CITALOPRAM HYDROBROMIDE) 1 by mouth at bedtime ONETOUCH ULTRA TEST  STRP (GLUCOSE BLOOD) check blood sugar once daily as directed Current Allergies: ! * OMEGA 3 OILS ! * CALCIUM ! * VIT D AMPICILLIN CODEINE * ZYRTEC * PHENYLPROPCINALONE

## 2010-06-17 NOTE — Letter (Signed)
Summary: [facility]   Imported By: Lanelle Bal 12/09/2009 09:50:49  _____________________________________________________________________  External Attachment:    Type:   Image     Comment:   External Document

## 2010-06-17 NOTE — Letter (Signed)
Summary: [facility] Instructions  [facility]  [address] [place], [place] [address]   Phone: [phone]  Fax: [phone]       Ann Cummings    March 07, 1941    MRN: [id number]        Procedure Day /Date: Friday January 21st, 2011     Arrival Time: 10:00am     Procedure Time: 11:00am     Location of Procedure:                    _x _  [facility] (4th Floor)                        PREPARATION FOR COLONOSCOPY WITH MOVIPREP   Starting 5 days prior to your procedure today do not eat nuts, seeds, popcorn, corn, beans, peas,  salads, or any raw vegetables.  Do not take any fiber supplements (e.g. Metamucil, Citrucel, and Benefiber).  THE DAY BEFORE YOUR PROCEDURE         DATE:  68/20/11   DAY:  Thursday   1.  Drink clear liquids the entire day-NO SOLID FOOD  2.  Do not drink anything colored red or purple.  Avoid juices with pulp.  No orange juice.  3.  Drink at least 64 oz. (8 glasses) of fluid/clear liquids during the day to prevent dehydration and help the prep work efficiently.  CLEAR LIQUIDS INCLUDE: Water Jello Ice Popsicles Tea (sugar ok, no milk/cream) Powdered fruit flavored drinks Coffee (sugar ok, no milk/cream) Gatorade Juice: apple, white grape, white cranberry  Lemonade Clear bullion, consomm, broth Carbonated beverages (any kind) Strained chicken noodle soup Hard Candy                             4.  In the morning, mix first dose of MoviPrep solution:    Empty 1 Pouch A and 1 Pouch B into the disposable container    Add lukewarm drinking water to the top line of the container. Mix to dissolve    Refrigerate (mixed solution should be used within 24 hrs)  5.  Begin drinking the prep at 5:00 p.m. The MoviPrep container is divided by 4 marks.   Every 15 minutes drink the solution down to the next mark (approximately 8 oz) until the full liter is complete.   6.  Follow completed prep with 16 oz of clear liquid of your  choice (Nothing red or purple).  Continue to drink clear liquids until bedtime.  7.  Before going to bed, mix second dose of MoviPrep solution:    Empty 1 Pouch A and 1 Pouch B into the disposable container    Add lukewarm drinking water to the top line of the container. Mix to dissolve    Refrigerate  THE DAY OF YOUR PROCEDURE      DATE: 06/07/09  DAY:  Friday  Beginning at  6:00 a.m. (5 hours before procedure):         1. Every 15 minutes, drink the solution down to the next mark (approx 8 oz) until the full liter is complete.  2. Follow completed prep with 16 oz. of clear liquid of your choice.    3. You may drink clear liquids until  9:00am  (2 HOURS BEFORE PROCEDURE).   MEDICATION INSTRUCTIONS  Unless otherwise instructed, you should take regular prescription medications with a small sip of water  as early as possible the morning of your procedure.          OTHER INSTRUCTIONS  You will need a responsible adult at least 68 years of age to accompany you and drive you home.   This person must remain in the waiting room during your procedure.  Wear loose fitting clothing that is easily removed.  Leave jewelry and other valuables at home.  However, you may wish to bring a book to read or  an iPod/MP3 player to listen to music as you wait for your procedure to start.  Remove all body piercing jewelry and leave at home.  Total time from sign-in until discharge is approximately 2-3 hours.  You should go home directly after your procedure and rest.  You can resume normal activities the  day after your procedure.  The day of your procedure you should not:   Drive   Make legal decisions   Operate machinery   Drink alcohol   Return to work  You will receive specific instructions about eating, activities and medications before you leave.    The above instructions have been reviewed and explained to me by   Marchelle Folks.    I fully understand and can verbalize  these instructions _____________________________ Date _________

## 2010-06-17 NOTE — Miscellaneous (Signed)
Summary: [facility] Previsit/prep  Clinical Lists Changes  Medications: Added new medication of MOVIPREP 100 GM  SOLR (PEG-KCL-NACL-NASULF-NA ASC-C) As per prep instructions. - Signed Rx of MOVIPREP 100 GM  SOLR (PEG-KCL-NACL-NASULF-NA ASC-C) As per prep instructions.;  #1 x 0;  Signed;  Entered by: Wyona Almas RN;  Authorized by: Iva Boop MD, [facility];  Method used: Electronically to [facility], [address], [place], [place]  [address], Ph: [phone], Fax: [phone] Observations: Added new observation of ALLERGY REV: Done (04/02/2010 13:51)    Prescriptions: MOVIPREP 100 GM  SOLR (PEG-KCL-NACL-NASULF-NA ASC-C) As per prep instructions.  #1 x 0   Entered by:   Wyona Almas RN   Authorized by:   Iva Boop MD, [facility]   Signed by:   Wyona Almas RN on 04/02/2010   Method used:   Electronically to        [facility]* (retail)       [address]       [place], [place]  [address]       Ph: [phone]       Fax: [phone]   RxID:   [id number]

## 2010-06-17 NOTE — Assessment & Plan Note (Signed)
Summary: B-12 INJ/Mily Malecki/CLE   Nurse Visit   Allergies: 1)  ! * Omega 3 Oils 2)  ! * Calcium 3)  ! * Vit D 4)  ! Neurontin 5)  ! Requip (Ropinirole Hcl) 6)  ! * Nasonex 7)  Ampicillin 8)  Codeine 9)  * Zyrtec 10)  * Phenylpropcinalone  Medication Administration  Injection # 1:    Medication: Vit B12 1000 mcg    Diagnosis: ANEMIA, B12 DEFICIENCY (ICD-281.1)    Route: IM    Site: R deltoid    Exp Date: 05/18/2011    Lot #: [id number]    Mfr: American Regent    Patient tolerated injection without complications    Given by: Delilah Shan CMA (AAMA) (November 14, 2009 11:05 AM)  Orders Added: 1)  Admin of Therapeutic Inj  intramuscular or subcutaneous [96372] 2)  Vit B12 1000 mcg [J3420]

## 2010-06-17 NOTE — Progress Notes (Signed)
Summary: Cyclobenzaprine 10mg   Phone Note Refill Request Call back at [phone] Message from:  [facility] Order on April 03, 2010 11:02 AM  Refills Requested: Medication #1:  FLEXERIL 10 MG TABS take one half to one by mouth three times a day prn [facility] Mail order electronically request refill on Cyclobenzaprine 10mg  tab. Last refill date not sent. Dr Milinda Antis is out of office today sick. Please advise.    Method Requested: Electronic Initial call taken by: Lewanda Rife LPN,  April 03, 2010 11:03 AM  Follow-up for Phone Call        may refill. Follow-up by: Eustaquio Boyden  MD,  April 03, 2010 12:19 PM    Prescriptions: FLEXERIL 10 MG TABS (CYCLOBENZAPRINE HCL) take one half to one by mouth three times a day prn  #45 x 1   Entered and Authorized by:   Eustaquio Boyden  MD   Signed by:   Eustaquio Boyden  MD on 04/03/2010   Method used:   Electronically to        [facility]* (retail)             ,          Ph: [phone]       Fax: [phone]   RxID:   [id number]

## 2010-06-17 NOTE — Procedures (Signed)
Summary: Upper Endoscopy w/DIL  Patient: [name] Note: All result statuses are Final unless otherwise noted.  Tests: (1) Upper Endoscopy w/DIL (UED)  UED Upper Endoscopy w/DIL                             DONE     [facility]     [address]     [place], [place]  [address]           ENDOSCOPY PROCEDURE REPORT           PATIENT:  Ann Cummings, Ann Cummings  MR#:  [id number]     BIRTHDATE:  November 29, 1940, 69 yrs. old  GENDER:  female           ENDOSCOPIST:  Iva Boop, MD, [facility]           PROCEDURE DATE:  06/24/2009     PROCEDURE:  EGD with biopsy, Elease Hashimoto Dilation of the Esophagus     ASA CLASS:  Class III     INDICATIONS:  1) iron deficiency anemia  2) dysphagia Dysphagia is     a new complaint (pills and solid food - beef) discussed in     pre-procedure H           MEDICATIONS:   Fentanyl 50 mcg IV, Versed 12.5 mg IV, Benadryl     12.5 mg IV     TOPICAL ANESTHETIC:  Exactacain Spray           DESCRIPTION OF PROCEDURE:   After the risks benefits and     alternatives of the procedure were thoroughly explained, informed     consent was obtained.  The LB [id number] [id number] endoscope was     introduced through the mouth and advanced to the second portion of     the duodenum, without limitations.  The instrument was slowly     withdrawn as the mucosa was carefully examined.     <<PROCEDUREIMAGES>>           The esophagus and gastroesophageal junction mucosae were     completely normal in appearance. Z-line at 36 cm.  Moderate     gastritis was found in the total stomach. Atrophic mucosa and     folds with erythema and slight friability. Multiple biopsies were     obtained and sent to pathology.  A sessile polyp was found in the     body of the stomach. It was 3 - 4 mm in size. Possibly removed     with biopsies. Multiple biopsies were obtained and sent to     pathology.  The duodenal bulb was normal in appearance, as was the     postbulbar duodenum. Multiple biopsies were  obtained and sent to     pathology. To look for occult celiac disease.    Dilation was then     performed at the total esophagus           1) Dilator:  Elease Hashimoto  Size(s):  54 french     Resistance:  minimal  Heme:  none           COMPLICATIONS:  None           ENDOSCOPIC IMPRESSION:     1) Normal esophagus - dilated 54 Fr due to dysphagia     2) Moderate gastritis in the total  stomach, looks atrophic-     biopsied     3) 3 -  4 mm sessile polyp in the body of the stomach - biopsied           4) Normal duodenum - biopsiesd ? celiac sprue     RECOMMENDATIONS:     1) await pathology results     Clear liquids until 1PM today then soft diet. resume normal diet     tomorrow.           REPEAT EXAM:  await biopises to determine need           Iva Boop, MD, [facility]           CC:  Marne A. Milinda Antis, M.D.     The Patient           n.     eSIGNED:   Iva Boop at 06/24/2009 11:41 AM           [name], [id number]  Note: An exclamation mark (!) indicates a result that was not dispersed into the flowsheet. Document Creation Date: 06/24/2009 11:42 AM _______________________________________________________________________  (1) Order result status: Final Collection or observation date-time: 06/24/2009 11:29 Requested date-time:  Receipt date-time:  Reported date-time:  Referring Physician:   Ordering Physician: Stan Head [phone]) Specimen Source:  Source: Launa Grill Order Number: [id number] Lab site:

## 2010-06-17 NOTE — Procedures (Signed)
Summary: Colonoscopy  Patient: Ann Cummings Note: All result statuses are Final unless otherwise noted.  Tests: (1) Colonoscopy (COL)   COL Colonoscopy           DONE     [facility]     [address] [place], [place]  [address]           COLONOSCOPY PROCEDURE REPORT           PATIENT:  Ann Cummings, Ann Cummings  MR#:  [id number]     BIRTHDATE:  07/28/1940, 69 yrs. old  GENDER:  female     ENDOSCOPIST:  Iva Boop, MD, [facility]     REF. BY:     PROCEDURE DATE:  04/15/2010     PROCEDURE:  Colonoscopy with biopsy     ASA CLASS:  Class II     INDICATIONS:  Prior polyp (2 cm TV adenoma) removed 6 months ago,     here for follow-up to ensure complete removal.     MEDICATIONS:   Fentanyl 100 mcg, Versed 10 mg           DESCRIPTION OF PROCEDURE:   After the risks benefits and     alternatives of the procedure were thoroughly explained, informed     consent was obtained.  Digital rectal exam was performed and     revealed no abnormalities.   The Pentax Colonoscope [id number]     endoscope was introduced through the anus and advanced to the     cecum, which was identified by both the appendix and ileocecal     valve, without limitations.  The quality of the prep was good,     using MoviPrep.  The instrument was then slowly withdrawn as the     colon was fully examined. Insertion: 3 minutes Withdrawal: 7     minutes     <<PROCEDUREIMAGES>>           FINDINGS:  A normal appearing cecum, ileocecal valve, and     appendiceal orifice were identified. The ascending, hepatic     flexure, transverse, splenic flexure, descending, sigmoid colon,     and rectum appeared unremarkable. I could see the tattoos at prior     polypectomy site in the ascending colon.  Multiple biopsies were     obtained from site and sent to pathology.   Retroflexed views in     the rectum revealed no abnormalities.    The scope was then     withdrawn from the patient and the procedure completed.        COMPLICATIONS:  None     ENDOSCOPIC IMPRESSION:     1) Normal colonoscopy - prior polypectomy site biopsied     RECOMMENDATIONS:     1) Await biopsy results     CBC and ferritin today to follow-up on iron-deficiency anemia.     REPEAT EXAM:  In for Colonoscopy, pending biopsy results.           Iva Boop, MD, [facility]           CC:  Judy Pimple, MD     The Patient           n.     eSIGNED:   Iva Boop at 04/15/2010 10:17 AM           [name], [id number]  Note: An exclamation mark (!) indicates a result that was not dispersed into the flowsheet. Document Creation  Date: 04/15/2010 10:17 AM _______________________________________________________________________  (1) Order result status: Final Collection or observation date-time: 04/15/2010 10:09 Requested date-time:  Receipt date-time:  Reported date-time:  Referring Physician:   Ordering Physician: Stan Head [phone]) Specimen Source:  Source: Launa Grill Order Number: [id number] Lab site:   Appended Document: Colonoscopy   Colonoscopy  Procedure date:  04/15/2010  Findings:      ENDOSCOPIC IMPRESSION:     1) Normal colonoscopy - prior polypectomy site biopsied  FINAL DIAGNOSIS   1. Colon, biopsy, ascending :  - BENIGN COLONIC MUCOSA, NO EVIDENCE OF DYSPLASIA, ADENOMATOUS CHANGES OR MALIGNANCY IDENTIFIED.  Procedures Next Due Date:    Colonoscopy: 04/2012

## 2010-06-17 NOTE — Miscellaneous (Signed)
Summary: mammogram results   Clinical Lists Changes  Observations: Added new observation of MAMMO DUE: 05/2010 (05/20/2009 8:36) Added new observation of MAMMOGRAM: normal (05/20/2009 8:36)      Preventive Care Screening  Mammogram:    Date:  05/20/2009    Next Due:  05/2010    Results:  normal

## 2010-06-17 NOTE — Assessment & Plan Note (Signed)
Summary: B-12 SHOT/Cheila Wickstrom/CLE   Nurse Visit   Allergies: 1)  ! * Omega 3 Oils 2)  ! * Calcium 3)  ! * Vit D 4)  ! Neurontin 5)  Ampicillin 6)  Codeine 7)  * Zyrtec 8)  * Phenylpropcinalone  Medication Administration  Injection # 1:    Medication: Vit B12 1000 mcg    Diagnosis: ANEMIA, B12 DEFICIENCY (ICD-281.1)    Route: IM    Site: L deltoid    Exp Date: 03/19/2011    Lot #: [id number]    Mfr: American Regent    Patient tolerated injection without complications    Given by: Lewanda Rife LPN (August 06, 2009 10:37 AM)  Orders Added: 1)  Vit B12 1000 mcg [J3420] 2)  Admin of Therapeutic Inj  intramuscular or subcutaneous [60454]

## 2010-06-17 NOTE — Letter (Signed)
Summary: Moviprep Instructions  [facility]  [address]   [place], [place] [address]   Phone: [phone]  Fax: [phone]       Ann Cummings    May 31, 1940    MRN: [id number]        Procedure Day /Date: Tuesday, 04-15-10     Arrival Time: 8:00 a.m.      Procedure Time: 9:00 a.m.     Location of Procedure:                    x   [facility] ( Outpatient Registration)   PREPARATION FOR COLONOSCOPY WITH MOVIPREP   Starting 5 days prior to your procedure 04-10-10 do not eat nuts, seeds, popcorn, corn, beans, peas,  salads, or any raw vegetables.  Do not take any fiber supplements (e.g. Metamucil, Citrucel, and Benefiber).  THE DAY BEFORE YOUR PROCEDURE         DATE:  04-14-10  DAY: Monday  1.  Drink clear liquids the entire day-NO SOLID FOOD  2.  Do not drink anything colored red or purple.  Avoid juices with pulp.  No orange juice.  3.  Drink at least 64 oz. (8 glasses) of fluid/clear liquids during the day to prevent dehydration and help the prep work efficiently.  CLEAR LIQUIDS INCLUDE: Water Jello Ice Popsicles Tea (sugar ok, no milk/cream) Powdered fruit flavored drinks Coffee (sugar ok, no milk/cream) Gatorade Juice: apple, white grape, white cranberry  Lemonade Clear bullion, consomm, broth Carbonated beverages (any kind) Strained chicken noodle soup Hard Candy                             4.  In the morning, mix first dose of MoviPrep solution:    Empty 1 Pouch A and 1 Pouch B into the disposable container    Add lukewarm drinking water to the top line of the container. Mix to dissolve    Refrigerate (mixed solution should be used within 24 hrs)  5.  Begin drinking the prep at 5:00 p.m. The MoviPrep container is divided by 4 marks.   Every 15 minutes drink the solution down to the next mark (approximately 8 oz) until the full liter is complete.   6.  Follow completed prep with 16 oz of clear liquid of your choice (Nothing red  or purple).  Continue to drink clear liquids until bedtime.  7.  Before going to bed, mix second dose of MoviPrep solution:    Empty 1 Pouch A and 1 Pouch B into the disposable container    Add lukewarm drinking water to the top line of the container. Mix to dissolve    Refrigerate  THE DAY OF YOUR PROCEDURE      DATE: 04-15-10  DAY: Tuesday  Beginning at 4:00 a.m. (5 hours before procedure):         1. Every 15 minutes, drink the solution down to the next mark (approx 8 oz) until the full liter is complete.  2. Follow completed prep with 16 oz. of clear liquid of your choice.    3. You may drink clear liquids until 5:00 a.m. (4 HOURS BEFORE PROCEDURE).   MEDICATION INSTRUCTIONS  Unless otherwise instructed, you should take regular prescription medications with a small sip of water   as early as possible the morning of your procedure.   Additional medication instructions: Hold Triamterene/HCTZ the morning of procedure.  OTHER INSTRUCTIONS  You will need a responsible adult at least 68 years of age to accompany you and drive you home.   This person must remain in the waiting room during your procedure.  Wear loose fitting clothing that is easily removed.  Leave jewelry and other valuables at home.  However, you may wish to bring a book to read or  an iPod/MP3 player to listen to music as you wait for your procedure to start.  Remove all body piercing jewelry and leave at home.  Total time from sign-in until discharge is approximately 2-3 hours.  You should go home directly after your procedure and rest.  You can resume normal activities the  day after your procedure.  The day of your procedure you should not:   Drive   Make legal decisions   Operate machinery   Drink alcohol   Return to work  You will receive specific instructions about eating, activities and medications before you leave.    The above instructions have been reviewed and  explained to me by   Wyona Almas RN  April 02, 2010 2:52 PM     I fully understand and can verbalize these instructions _____________________________ Date _________

## 2010-06-17 NOTE — Miscellaneous (Signed)
Summary: med list update- lancets  Medications Added ONETOUCH FINEPOINT LANCETS  MISC (LANCETS) use as directed       Clinical Lists Changes  Medications: Added new medication of ONETOUCH FINEPOINT LANCETS  MISC (LANCETS) use as directed     Prior Medications: MAXZIDE-25 37.5-25 MG TABS (TRIAMTERENE-HCTZ) Take 2 by mouth once daily in am ATENOLOL 25 MG TABS (ATENOLOL) Take 1/2 by mouth daily LOTREL 10-20 MG CAPS (AMLODIPINE BESY-BENAZEPRIL HCL) Take one by mouth daily CRESTOR 10 MG TABS (ROSUVASTATIN CALCIUM) Take 1/2 by mouth once daily CO Q10 100 MG TABS (COENZYME Q10) Take 1 tablet by mouth two times a day ASPIRIN 81 MG TBEC (ASPIRIN) Take one by mouth daily as needed MECLIZINE HCL 25 MG TABS (MECLIZINE HCL) take one by mouth three times a day as needed FLEXERIL 10 MG TABS (CYCLOBENZAPRINE HCL) take one half to one by mouth three times a day prn ALBUTEROL  AERS (ALBUTEROL AERS) use as directed as needed 2 puffs q 4 hours IBUPROFEN  CAPS (IBUPROFEN CAPS) take 2 daily prn LOTRISONE 1-0.05 % CREA (CLOTRIMAZOLE-BETAMETHASONE) apply to aff area once daily as needed ALPRAZOLAM 0.5 MG  TABS (ALPRAZOLAM) 1/2 to 1 by mouth two times a day as needed severe anxiety DICYCLOMINE HCL 20 MG TABS (DICYCLOMINE HCL) take 1 tablet by mouth two times a day before breakfast and dinner CELEXA 20 MG TABS (CITALOPRAM HYDROBROMIDE) 1 by mouth at bedtime ONETOUCH ULTRA TEST  STRP (GLUCOSE BLOOD) check blood sugar once daily as directed VITAMIN B12 INJECTION () take as directed once monthly OMEPRAZOLE 20 MG CPDR (OMEPRAZOLE) one tablet by mouth once daily FERROUS SULFATE 325 (65 FE) MG  TABS (FERROUS SULFATE) 1 by mouth twice a day GENERIC OTC B COMPLEX VITAMIN () 1/2  by mouth once daily BENADRYL 25 MG CAPS (DIPHENHYDRAMINE HCL) 1 by mouth at bedtime RHINOCORT AQUA 32 MCG/ACT SUSP (BUDESONIDE) 2 sprays in each nostril once daily in allergy season FLONASE 50 MCG/ACT SUSP (FLUTICASONE PROPIONATE) Two  sprays each nostril once daily. ONETOUCH FINEPOINT LANCETS  MISC (LANCETS) use as directed Current Allergies: ! * OMEGA 3 OILS ! * CALCIUM ! * VIT D ! NEURONTIN ! REQUIP (ROPINIROLE HCL) ! * NASONEX AMPICILLIN CODEINE * ZYRTEC * PHENYLPROPCINALONE

## 2010-06-17 NOTE — Letter (Signed)
Summary: Pre Visit Letter Revised  [facility]  [address] [place], [place] [address]   Phone: [phone]  Fax: [phone]        03/25/2010 MRN: [id number] Ann Cummings [address] [place], [place]  [address]   Procedure Date:  April 15, 2010  Welcome to the Gastroenterology Division at [facility]    You are scheduled to see a nurse for your pre-procedure visit on April 02, 2010 at 2:00 pm on the 3rd floor at [facility], [address]  We ask that you try to arrive at our office 15 minutes prior to your appointment time to allow for check-in.  Please take a minute to review the attached form.  If you answer "Yes" to one or more of the questions on the first page, we ask that you call the person listed at your earliest opportunity.  If you answer "No" to all of the questions, please complete the rest of the form and bring it to your appointment.    Your nurse visit will consist of discussing your medical and surgical history, your immediate family medical history, and your medications.   If you are unable to list all of your medications on the form, please bring the medication bottles to your appointment and we will list them.  We will need to be aware of both prescribed and over the counter drugs.  We will need to know exact dosage information as well.    Please be prepared to read and sign documents such as consent forms, a financial agreement, and acknowledgement forms.  If necessary, and with your consent, a friend or relative is welcome to sit-in on the nurse visit with you.  Please bring your insurance card so that we may make a copy of it.  If your insurance requires a referral to see a specialist, please bring your referral form from your primary care physician.  No co-pay is required for this nurse visit.     If you cannot keep your appointment, please call [phone] to cancel or reschedule prior to your appointment date.  This allows  [place] the opportunity to schedule an appointment for another patient in need of care.    Thank you for choosing [facility] for your medical needs.  We appreciate the opportunity to care for you.  Please visit [place] at our website  to learn more about our practice.  Sincerely, The Gastroenterology Division

## 2010-06-17 NOTE — Assessment & Plan Note (Signed)
Summary: b-12 shot   Nurse Visit   Allergies: 1)  ! * Omega 3 Oils 2)  ! * Calcium 3)  ! * Vit D 4)  ! Neurontin 5)  Ampicillin 6)  Codeine 7)  * Zyrtec 8)  * Phenylpropcinalone  Medication Administration  Injection # 1:    Medication: Vit B12 1000 mcg    Diagnosis: ANEMIA, B12 DEFICIENCY (ICD-281.1)    Route: IM    Site: R deltoid    Exp Date: 102012    Lot #: [id number]    Mfr: American Regent    Patient tolerated injection without complications    Given by: Lowella Petties CMA (June 13, 2009 11:30 AM)  Orders Added: 1)  Vit B12 1000 mcg [J3420] 2)  Admin of Therapeutic Inj  intramuscular or subcutaneous [96372]  Prior Medications: MAXZIDE-25 37.5-25 MG TABS (TRIAMTERENE-HCTZ) Take 2 by mouth once daily in am ATENOLOL 25 MG TABS (ATENOLOL) Take 1/2 by mouth daily LOTREL 10-20 MG CAPS (AMLODIPINE BESY-BENAZEPRIL HCL) Take one by mouth daily CRESTOR 10 MG TABS (ROSUVASTATIN CALCIUM) Take 1/2 by mouth once daily RHINOCORT AQUA  SUSP (BUDESONIDE (NASAL) SUSP) 1 spray in each nostril once daily as needed CO Q10 100 MG TABS (COENZYME Q10) Take one by mouth tid ASPIRIN 81 MG TBEC (ASPIRIN) Take one by mouth daily MECLIZINE HCL 25 MG TABS (MECLIZINE HCL) take one by mouth three times a day prn FLEXERIL 10 MG TABS (CYCLOBENZAPRINE HCL) take one half to one by mouth three times a day prn ALBUTEROL  AERS (ALBUTEROL AERS) use as directed as needed 2 puffs q 4 hours IBUPROFEN  CAPS (IBUPROFEN CAPS) take 2 daily prn LOTRISONE 1-0.05 % CREA (CLOTRIMAZOLE-BETAMETHASONE) apply to aff area once daily prn ALPRAZOLAM 0.5 MG  TABS (ALPRAZOLAM) 1/2 to 1 by mouth two times a day as needed severe anxiety VICODIN 5-500 MG  TABS (HYDROCODONE-ACETAMINOPHEN) 1 q6h as needed pain HYOSCYAMINE SULFATE CR 0.375 MG  CP12 (HYOSCYAMINE SULFATE) 1 by mouth two times a day as needed CELEXA 20 MG TABS (CITALOPRAM HYDROBROMIDE) 1 by mouth at bedtime ONETOUCH ULTRA TEST  STRP (GLUCOSE BLOOD) check blood  sugar once daily as directed VITAMIN B12 INJECTION () take as directed OMEPRAZOLE 20 MG CPDR (OMEPRAZOLE) one tablet by mouth once daily FERROUS SULFATE 325 (65 FE) MG  TABS (FERROUS SULFATE) 1 by mouth bid Current Allergies: ! * OMEGA 3 OILS ! * CALCIUM ! * VIT D ! NEURONTIN AMPICILLIN CODEINE * ZYRTEC * PHENYLPROPCINALONE   Medication Administration  Injection # 1:    Medication: Vit B12 1000 mcg    Diagnosis: ANEMIA, B12 DEFICIENCY (ICD-281.1)    Route: IM    Site: R deltoid    Exp Date: 102012    Lot #: [id number]    Mfr: American Regent    Patient tolerated injection without complications    Given by: Lowella Petties CMA (June 13, 2009 11:30 AM)  Orders Added: 1)  Vit B12 1000 mcg [J3420] 2)  Admin of Therapeutic Inj  intramuscular or subcutaneous [[id number]]

## 2010-06-17 NOTE — Letter (Signed)
Summary: Patient [facility] Biopsy Results  [facility]  [address] [place], [place] [address]   Phone: [phone]  Fax: [phone]        June 25, 2009 MRN: [id number]    MISHELLE HASSAN [address] [place], [place]  [address]    Dear Ms. Orosz,  I am pleased to inform you that the biopsies taken during your recent endoscopic examination did not show any evidence of  cancer or pre-cancerous changes upon pathologic examination.  Continue with the treatment plan as outlined on the day of your      exam.  You will need to follow-up with Dr. Milinda Antis re: anemia with iron and vitamin B12 deficiencies and see me as needed. Will pln to follow-up on the colon polyp as previosuly explained.  Please call us if you are having persistent problems or have questions about your condition that have not been fully answered at this time.  Best wishes for successful back surgery!  Sincerely,  Iva Boop MD, [facility]  This letter has been electronically signed by your physician.  Appended Document: Patient Notice-Endo Biopsy Results letter mailed 2.11.11

## 2010-06-17 NOTE — Letter (Signed)
Summary: Appt. Confirmation/[facility]  Appt. Confirmation/[facility]   Imported By: Lanelle Bal 06/05/2009 09:01:44  _____________________________________________________________________  External Attachment:    Type:   Image     Comment:   External Document

## 2010-06-17 NOTE — Progress Notes (Signed)
Summary: schedule colonoscopy  Phone Note Outgoing Call   Summary of Call: Please call her to see about scheduling colon at [facility] (with possible APC) Iva Boop MD, [facility]  March 13, 2010 7:31 PM   Follow-up for Phone Call        I spoke to pt and she will speak with her driver and call me back to schedule on 11/29 or 11/30. Ann Cummings CMA Ann Cummings)  March 20, 2010 4:17 PM   RC from pt.  Her colonoscopy is scheduled for 11/29 at [facility].  Previsit letter mailed.  Pt to call with any other questions. Follow-up by: Ann Cummings CMA Ann Cummings),  March 24, 2010 4:25 PM

## 2010-06-17 NOTE — Procedures (Signed)
Summary: Pt's Medication List/[facility]  Pt's Medication List/[facility]   Imported By: Sherian Rein 04/24/2010 10:44:16  _____________________________________________________________________  External Attachment:    Type:   Image     Comment:   External Document

## 2010-06-17 NOTE — Progress Notes (Signed)
Summary: ok to give flonase?  Phone Note From Pharmacy   Caller: [facility]* Summary of Call: Pt was given rhinocort script today but insurance wont cover this.  Pharmacy is asking if they can give flonase instead.  Please advise. Initial call taken by: Lowella Petties CMA,  August 21, 2009 12:15 PM  Follow-up for Phone Call        need to ask the patient -- in past nasonex has not worked so flonase may not either  talk to her about whether she wants to try flonase or if she wants to pay out of pocket for rhinocort  is up to her  Follow-up by: Rishika Part MD,  August 21, 2009 12:17 PM  Additional Follow-up for Phone Call Additional follow up Details #1::        Left message for patient to call back. Lewanda Rife LPN  August 21, 1608 2:48 PM   Advised pt, she wants to try flonase.        Lowella Petties CMA  August 21, 2009 3:17 PM  ok - change to flonase 2 sprays in each nostril once daily #1 mdi and 11 ref   Additional Follow-up by: Ailish Part MD,  August 21, 2009 3:33 PM    Additional Follow-up for Phone Call Additional follow up Details #2::    Medication phoned to [facility] pharmacy as instructed. Lewanda Rife LPN  August 22, 9602 3:57 PM    Appended Document: ok to give flonase? I tried to do update for med list to add Flonase but could not due to another note needing to be signed off by Dr. I will hold on my desk top until I can do update.  Appended Document: ok to give flonase? Meds list updated.

## 2010-06-17 NOTE — Letter (Signed)
Summary: [facility]  [facility]   Imported By: Maryln Gottron 11/08/2009 15:43:12  _____________________________________________________________________  External Attachment:    Type:   Image     Comment:   External Document

## 2010-06-17 NOTE — Letter (Signed)
Summary: EGD Instructions  [facility]  [address] [place], [place] [address]   Phone: [phone]  Fax: [phone]       LAKESHA LEVINSON    1940/09/29    MRN: [id number]       Procedure Day Dorna Bloom:  Duanne Limerick  06/24/09     Arrival Time:  9:00AM     Procedure Time:  10:00AM     Location of Procedure:                    Juliann Pares  _ [facility] (4th Floor)   PREPARATION FOR ENDOSCOPY   On 06/24/09 THE DAY OF THE PROCEDURE:  1.   No solid foods, milk or milk products are allowed after midnight the night before your procedure.  2.   Do not drink anything colored red or purple.  Avoid juices with pulp.  No orange juice.  3.  You may drink clear liquids until 8:00AM, which is 2 hours before your procedure.                                                                                                CLEAR LIQUIDS INCLUDE: Water Jello Ice Popsicles Tea (sugar ok, no milk/cream) Powdered fruit flavored drinks Coffee (sugar ok, no milk/cream) Gatorade Juice: apple, white grape, white cranberry  Lemonade Clear bullion, consomm, broth Carbonated beverages (any kind) Strained chicken noodle soup Hard Candy   MEDICATION INSTRUCTIONS  Unless otherwise instructed, you should take regular prescription medications with a small sip of water as early as possible the morning of your procedure. Hold Iron 2-3 days prior to rocedure.   Additional medication instructions: Hold Maxide the morning of procedure.  Take Atenolol, Lotrel & Hyocyamine.            OTHER INSTRUCTIONS  You will need a responsible adult at least 68 years of age to accompany you and drive you home.   This person must remain in the waiting room during your procedure.  Wear loose fitting clothing that is easily removed.  Leave jewelry and other valuables at home.  However, you may wish to bring a book to read or an iPod/MP3 player to listen to music as you wait for your procedure to  start.  Remove all body piercing jewelry and leave at home.  Total time from sign-in until discharge is approximately 2-3 hours.  You should go home directly after your procedure and rest.  You can resume normal activities the day after your procedure.  The day of your procedure you should not:   Drive   Make legal decisions   Operate machinery   Drink alcohol   Return to work  You will receive specific instructions about eating, activities and medications before you leave.    The above instructions have been reviewed and explained to me by  Wyona Almas RN  June 12, 2009 11:11 AM    I fully understand and can verbalize these instructions _____________________________ Date _________

## 2010-06-17 NOTE — Progress Notes (Signed)
----   Converted from flag ---- ---- 06/26/2009 10:41 AM, Arman Filter LPN wrote: pt needs ov with kc to discuss sleep study results. ------------------------------  pt scheduled to see kc 07-03-2009 at 11:15am

## 2010-06-17 NOTE — Procedures (Signed)
Summary: Colonoscopy: Hemorrhoids   Colonoscopy  Procedure date:  02/21/2003  Findings:      Results: Hemorrhoids.     Location:  [facility].  Negative Pathology  Patient Name: Ann Cummings, Ann Cummings. MRN:  Procedure Procedures: Colonoscopy CPT: [phone].    with biopsy. CPT: Q5068410.  Personnel: Endoscopist: Iva Boop, MD, [facility].  Referred By: Roxy Manns, MD.  Exam Location: Exam performed in Outpatient Clinic. Outpatient  Patient Consent: Procedure, Alternatives, Risks and Benefits discussed, consent obtained, from patient. Consent was obtained by the RN.  Indications Symptoms: Diarrhea Rectal Bleeding.  History  Pre-Exam Physical: Performed Feb 21, 2003. Cardio-pulmonary exam, Rectal exam, HEENT exam , Abdominal exam, Mental status exam WNL.  Exam Exam: Extent of exam reached: Terminal Ileum, extent intended: Terminal Ileum.  The cecum was identified by appendiceal orifice and IC valve. Patient position: on left side. ASA Classification: II. Tolerance: excellent.  Monitoring: Pulse and BP monitoring, Oximetry used. Supplemental O2 given.  Colon Prep Used MiraLax for colon prep. Prep results: excellent.  Sedation Meds: Patient assessed and found to be appropriate for moderate (conscious) sedation. Sedation was managed by the Endoscopist. Fentanyl 100 mcg. given IV. Versed 10 mg. given IV.  Findings - NORMAL EXAM: Terminal Ileum to Sigmoid Colon. Biopsy/Normal Exam taken. Comments: Random colon biopsies. Some loss of vascular pattern.  HEMORRHOIDS: Internal and External. Size: Grade I. Not bleeding. Not thrombosed. ICD9: Hemorrhoids, Internal and  External: 455.6.   Assessment Abnormal examination, see findings above.  Diagnoses: 455.6: Hemorrhoids, Internal and  External.   Comments: Mild hemorrhoids. No polyps or colon cancer seen. Events  Unplanned Interventions: No intervention was required.  Plans Medication Plan: Await  pathology.  Disposition: After procedure patient sent to recovery. After recovery patient sent home.  Scheduling/Referral: Clinic Visit, to Iva Boop, MD, [facility], 1-2 weeks to review path ,   Comments: Samples of Chester Holstein given.   CC:   Roxy Manns, MD  This report was created from the original endoscopy report, which was reviewed and signed by the above listed endoscopist.

## 2010-06-17 NOTE — Progress Notes (Signed)
Summary: refill request for flexeril  Phone Note Refill Request Message from:  Fax from Pharmacy  Refills Requested: Medication #1:  FLEXERIL 10 MG TABS take one half to one by mouth three times a day prn Faxed request from [facility], I will send electronically if approved.  Initial call taken by: Lowella Petties CMA,  Oct 10, 2009 12:03 PM  Follow-up for Phone Call        px written on EMR for call in  Follow-up by: Bettie Part MD,  Oct 10, 2009 12:31 PM  Additional Follow-up for Phone Call Additional follow up Details #1::        Request is from [facility], ok to send 90 day supply?  Lowella Petties CMA  Oct 10, 2009 3:16 PM  that is ok-- ask her how many average she uses per month   ok px written on EMR for call in  Additional Follow-up by: Jamelyn Part MD,  Oct 10, 2009 3:55 PM    Additional Follow-up for Phone Call Additional follow up Details #2::    Pt says she uses as few as possible and thinks that 45 in a 90 day period should be enough.          Lowella Petties CMA  Oct 10, 2009 4:01 PM   New/Updated Medications: FLEXERIL 10 MG TABS (CYCLOBENZAPRINE HCL) take one half to one by mouth three times a day prn Prescriptions: FLEXERIL 10 MG TABS (CYCLOBENZAPRINE HCL) take one half to one by mouth three times a day prn  #45 x 1   Entered by:   Lowella Petties CMA   Authorized by:   Briann Part MD   Signed by:   Lowella Petties CMA on 10/10/2009   Method used:   Electronically to        [facility]* (mail-order)             ,          Ph: [phone]       Fax: [phone]   RxID:   [id number] FLEXERIL 10 MG TABS (CYCLOBENZAPRINE HCL) take one half to one by mouth three times a day prn  #30 x 2   Entered and Authorized by:   Arma Part MD   Signed by:   Delilah Shan CMA (AAMA) on 10/10/2009   Method used:   Telephoned to ...       [facility]* (retail)       [address]       [place], [place]  [address]       Ph: [phone]       Fax:  [phone]   RxID:   [id number]

## 2010-06-17 NOTE — Procedures (Signed)
Summary: EGD: Normal   EGD  Procedure date:  03/22/2003  Findings:      Findings: Normal  Location: [facility]   Patient Name: Ann Cummings, Ann Cummings. MRN:  Procedure Procedures: Panendoscopy (EGD) CPT: 43235.  Personnel: Endoscopist: Iva Boop, MD, [facility].  Referred By: Roxy Manns, MD.  Exam Location: Exam performed in Outpatient Clinic. Outpatient  Patient Consent: Procedure, Alternatives, Risks and Benefits discussed, consent obtained, from patient. Consent was obtained by the RN.  Indications  Evaluation of: Anemia,   History  Current Medications: Patient is not currently taking Coumadin.  Pre-Exam Physical: Performed Mar 22, 2003  Cardio-pulmonary exam, HEENT exam, Abdominal exam, Mental status exam WNL.  Exam Exam Info: Maximum depth of insertion Duodenum, intended Duodenum. Patient position: on left side. Vocal cords not visualized. Gastric retroflexion performed. Images taken. ASA Classification: II. Tolerance: excellent.  Sedation Meds: Patient assessed and found to be appropriate for moderate (conscious) sedation. Sedation was managed by the Endoscopist. Fentanyl 100 mcg. given IV. Versed 8.5 mg. given IV. Cetacaine Spray 2 sprays given aerosolized.  Monitoring: BP and pulse monitoring done. Oximetry used. Supplemental O2 given  Findings - Normal: Proximal Esophagus to Duodenal 2nd Portion.   Assessment Normal examination.  Events  Unplanned Intervention: No unplanned interventions were required.  Plans Medication(s): Continue current medications.  Disposition: After procedure patient sent to recovery. After recovery patient sent home.  Scheduling: Primary Care Provider, to Roxy Manns, MD, as planned  Clinic Visit, to Iva Boop, MD, [facility], as needed   Comments: I will notify patient about outstanding labs.   CC:   Roxy Manns, MD  This report was created from the original endoscopy report, which was reviewed and  signed by the above listed endoscopist.
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
## 2010-06-17 NOTE — Progress Notes (Signed)
Summary: Schedule recall colon with APC   Phone Note Outgoing Call Call back at [facility] Phone [phone]   Call placed by: Darcey Nora RN, CGRN,  November 20, 2009 9:53 AM Call placed to: Patient Summary of Call: Patient  was contacted to schedule an colon with [facility] at [facility].  patient wants to discuss with her daughter and call me back.  Available date 12/23/09, 12/24/09, 12/25/09 Initial call taken by: Darcey Nora RN, CGRN,  November 20, 2009 10:02 AM  Follow-up for Phone Call        Patient  returned my call, she is still recovering from back surgery and wants to wait till later in the year.  She will call back when she is ready to schedule later this year. Follow-up by: Darcey Nora RN, CGRN,  November 20, 2009 10:34 AM  Additional Follow-up for Phone Call Additional follow up Details #1::        place another recall for October 2011 Additional Follow-up by: Iva Boop MD, Clementeen Graham,  November 20, 2009 12:41 PM    Additional Follow-up for Phone Call Additional follow up Details #2::    new recall placed in [facility] Follow-up by: Darcey Nora RN, CGRN,  November 20, 2009 1:32 PM

## 2010-06-17 NOTE — Progress Notes (Signed)
Summary: no residual polyp, Hgb 11.3, ferritin 24  Phone Note Outgoing Call   Summary of Call: let her know no residual polyp 1) repeat colonoscopy 2 years routine 2) still mildly anemic and ferritin only 24 - this suggests iron she takes (confirm she is taking please) is not getting absorbed. If that is case we can arrange an iron infusion to restore iron vs increasing iron to 3 times a day . Since her Hgb is almost normal so we can just have her stay on the oral iron and follow-up with Dr, Milinda Antis on this - that is my recommendation and after three times a day iron then recheck things in 3-6 mos with Dr. Milinda Antis, if still low then could do iron infusion in short stay  cc Dr Milinda Antis when complete Iva Boop MD, [facility]  April 20, 2010 1:39 PM   Follow-up for Phone Call        I spoke to pt  and notified her of polyp results.  We discussed her lab results and she states she has not been taking iron due to bowel issues.  I reviewed with her the type of iron she is taking and how many milligrams each tablet contains.  Pt will purchase a 65 mg ferrous sulfate and begin taking twice daily.  She will follow up with Dr. Milinda Antis in 3-6 months for a recheck on this.  I advised pt she could start with two times a day dosing as she has not been taking any iron and would double check with Dr.Gessner to make sure he is agreeable with this.  Pt understands I will call her back if Dr.Gessner would prefer she take three times a day. Dr. Leone Payor, please advise. Follow-up by: Francee Piccolo CMA Duncan Dull),  April 21, 2010 1:58 PM  Additional Follow-up for Phone Call Additional follow up Details #1::        This plan is fine she may need fiber, stool softener, etc but she can discuss with Dr. Milinda Antis.  Additional Follow-up by: Iva Boop MD, Clementeen Graham,  April 21, 2010 3:21 PM      Appended Document: no residual polyp, Hgb 11.3, ferritin 24 please let pt know that GI wants her to f/u with me to re check  anemia in 3-6 mo  she can make the appt now or later  Appended Document: no residual polyp, Hgb 11.3, ferritin 24 Patient notified as instructed by telephone. Pt said she would call back to schedule appt.

## 2010-06-17 NOTE — Miscellaneous (Signed)
Summary: [facility] Previsit/prep  Clinical Lists Changes  Observations: Added new observation of ALLERGY REV: Done (06/12/2009 10:21)

## 2010-06-17 NOTE — Progress Notes (Signed)
Summary: med switch   Phone Note Call from Patient Call back at [facility] Phone [phone]   Caller: Patient Call For: Dr. Leone Payor Reason for Call: Talk to Nurse Summary of Call: pt cannot afford Hyoscyamine Sulfate and would like to knowif there is something comparable Initial call taken by: Vallarie Mare,  August 06, 2009 9:10 AM  Follow-up for Phone Call        Pt states generic hyoscyamine is $81/month.  I advised pt she may be able to switch to dicyclomine, but she would need to get this at [facility].  She is OK with this.  Is this appropriate for this pt?  What dose does she need?  Please advise. Follow-up by: Francee Piccolo CMA Duncan Dull),  August 07, 2009 9:24 AM  Additional Follow-up for Phone Call Additional follow up Details #1::        Dicyclomine 20 mg before breakfast and supper #60 5 refills Stop hyoscyamine Iva Boop MD, [facility]  August 07, 2009 10:52 AM     Additional Follow-up for Phone Call Additional follow up Details #2::    Pt notified and rx sent to [facility]. Follow-up by: Francee Piccolo CMA Duncan Dull),  August 07, 2009 10:56 AM  New/Updated Medications: DICYCLOMINE HCL 20 MG TABS (DICYCLOMINE HCL) take 1 tablet by mouth two times a day before breakfast and dinner Prescriptions: DICYCLOMINE HCL 20 MG TABS (DICYCLOMINE HCL) take 1 tablet by mouth two times a day before breakfast and dinner  #60 x 5   Entered by:   Francee Piccolo CMA (AAMA)   Authorized by:   Iva Boop MD, [facility]   Signed by:   Francee Piccolo CMA (AAMA) on 08/07/2009   Method used:   Electronically to        [facility] (retail)       [address], [address]       [place], [place]  [address]       Ph: [phone]       Fax: [phone]   RxID:   [id number]

## 2010-06-17 NOTE — Assessment & Plan Note (Signed)
Summary: ov to discuss sleep study results/mg   Copy to:  [facility] Primary Provider/Referring Provider:  Colon Flattery Tower MD  CC:  Pt is here for a f/u appt to discuss sleep study results.  .  History of Present Illness: The pt comes in today to discuss her recent sleep study results.  She was found to have only 2 hypopneas the entire night, and no apneas.  However, she only had a total sleep time of .  She had desat as low as 87%, but no significant time less than 88%. Finally, she had 20 PLMS with 2 per hour resulting in arousal or awakening.   I have reviewed her study with her in detail, and answered all of her questions.  Current Medications (verified): 1)  Maxzide-25 37.5-25 Mg Tabs (Triamterene-Hctz) .... Take 2 By Mouth Once Daily in Am 2)  Atenolol 25 Mg Tabs (Atenolol) .... Take 1/2 By Mouth Daily 3)  Lotrel 10-20 Mg Caps (Amlodipine Besy-Benazepril Hcl) .... Take One By Mouth Daily 4)  Crestor 10 Mg Tabs (Rosuvastatin Calcium) .... Take 1/2 By Mouth Once Daily 5)  Rhinocort Aqua  Susp (Budesonide (Nasal) Susp) .Marland Kitchen.. 1 Spray in Each Nostril Once Daily As Needed 6)  Co Q10 100 Mg Tabs (Coenzyme Q10) .... Take 1 Tablet By Mouth Two Times A Day 7)  Aspirin 81 Mg Tbec (Aspirin) .... Take One By Mouth Daily As Needed 8)  Meclizine Hcl 25 Mg Tabs (Meclizine Hcl) .... Take One By Mouth Three Times A Day Prn 9)  Flexeril 10 Mg Tabs (Cyclobenzaprine Hcl) .... Take One Half To One By Mouth Three Times A Day Prn 10)  Albuterol  Aers (Albuterol Aers) .... Use As Directed As Needed 2 Puffs Q 4 Hours 11)  Ibuprofen  Caps (Ibuprofen Caps) .... Take 2 Daily Prn 12)  Lotrisone 1-0.05 % Crea (Clotrimazole-Betamethasone) .... Apply To Aff Area Once Daily Prn 13)  Alprazolam 0.5 Mg  Tabs (Alprazolam) .... 1/2 To 1 By Mouth Two Times A Day As Needed Severe Anxiety 14)  Hyoscyamine Sulfate Cr 0.375 Mg  Cp12 (Hyoscyamine Sulfate) .Marland Kitchen.. 1 By Mouth Two Times A Day As Needed 15)  Celexa 20 Mg Tabs  (Citalopram Hydrobromide) .Marland Kitchen.. 1 By Mouth At Bedtime 16)  Onetouch Ultra Test  Strp (Glucose Blood) .... Check Blood Sugar Once Daily As Directed 17)  Vitamin B12 Injection .... Take As Directed 18)  Omeprazole 20 Mg Cpdr (Omeprazole) .... One Tablet By Mouth Once Daily 19)  Ferrous Sulfate 325 (65 Fe) Mg  Tabs (Ferrous Sulfate) .Marland Kitchen.. 1 By Mouth Bid  Allergies (verified): 1)  ! * Omega 3 Oils 2)  ! * Calcium 3)  ! * Vit D 4)  ! Neurontin 5)  Ampicillin 6)  Codeine 7)  * Zyrtec 8)  * Phenylpropcinalone  Vital Signs:  Patient profile:   70 year old female Height:      63.5 inches Weight:      215.50 pounds BMI:     37.71 O2 Sat:      96 % on Room air Temp:     98.3 degrees F oral Pulse rate:   68 / minute BP sitting:   148 / 70  (left arm) Cuff size:   regular  Vitals Entered By: Arman Filter LPN (July 03, 2009 11:27 AM)  O2 Flow:  Room air CC: Pt is here for a f/u appt to discuss sleep study results.   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  The pt really is not c/o sleepiness, but rather awakenings at night, and also memory/concentration problems during day.  All of this may be related to her chronic back pain ( leg movements, sleep disruption, etc), but I do think it is reasonable to give her a therapeutic trial of a dopamine agonist for RLS to see if she sees a difference.  If she does not, there is no obvious sleep disorder causing her sleep and daytime issues.  Time spent with pt today reviewing study and discussing treatment options was .  Medications Added to Medication List This Visit: 1)  Co Q10 100 Mg Tabs (Coenzyme q10) .... Take 1  tablet by mouth two times a day 2)  Aspirin 81 Mg Tbec (Aspirin) .... Take one by mouth daily as needed 3)  Requip 0.5 Mg Tabs (Ropinirole hcl) .... One after dinner each night for a week, then can increase to 2 after dinner if still having symptoms.  Other Orders: Est. Patient Level III ([address])  Patient Instructions: 1)  work on weight loss 2)  stay off back while sleeping 3)  will try requip 0.5mg  one- two after dinner as a trial for next 3 weeks.  See directions on prescription. 4)  please call me in 3 weeks with response.   Prescriptions: REQUIP 0.5 MG  TABS (ROPINIROLE HCL) one after dinner each night for a week, then can increase to 2 after dinner if still having symptoms.  #60 x 6   Entered and Authorized by:   Barbaraann Share MD   Signed by:   Barbaraann Share MD on 07/03/2009   Method used:   Print then Give to Patient   RxID:   [id number]

## 2010-06-17 NOTE — Assessment & Plan Note (Signed)
Summary: b-12 shot   Nurse Visit   Allergies: 1)  ! * Omega 3 Oils 2)  ! * Calcium 3)  ! * Vit D 4)  ! Neurontin 5)  Ampicillin 6)  Codeine 7)  * Zyrtec 8)  * Phenylpropcinalone  Medication Administration  Injection # 1:    Medication: Vit B12 1000 mcg    Diagnosis: ANEMIA, B12 DEFICIENCY (ICD-281.1)    Route: IM    Site: L deltoid    Exp Date: 09/2010    Lot #: [id number]    Mfr: [employer] Regent    Patient tolerated injection without complications    Given by: Lowella Petties CMA (June 06, 2009 11:36 AM)  Orders Added: 1)  Vit B12 1000 mcg [J3420] 2)  Admin of Therapeutic Inj  intramuscular or subcutaneous [96372]   Medication Administration  Injection # 1:    Medication: Vit B12 1000 mcg    Diagnosis: ANEMIA, B12 DEFICIENCY (ICD-281.1)    Route: IM    Site: L deltoid    Exp Date: 09/2010    Lot #: [id number]    Mfr: [employer] Regent    Patient tolerated injection without complications    Given by: Lowella Petties CMA (June 06, 2009 11:36 AM)  Orders Added: 1)  Vit B12 1000 mcg [J3420] 2)  Admin of Therapeutic Inj  intramuscular or subcutaneous [[id number]]

## 2010-06-17 NOTE — Progress Notes (Signed)
Summary: Did not want to resch referral to Neurologist Andrena Mews)  Phone Note Outgoing Call   Summary of Call: Pt not ready to rescheduled Neurologist appt for Migranes. Will discuss as  next office visit w/ Dr. Milinda Antis.Daine Gip  August 06, 2009 11:09 AM Initial call taken by: Daine Gip,  August 06, 2009 11:09 AM  Follow-up for Phone Call        I am worried about sed rate being high ( possibly can mean inflammation in temporal artery) if she is not going to go to neurology-I need to re check this number and please ask her how headaches are please sched lab for ESR for headache - thanks Follow-up by: Mayleen Part MD,  August 06, 2009 11:31 AM  Additional Follow-up for Phone Call Additional follow up Details #1::        She has an appt. on April 6th.  Do you want the ESR now or just prior to that appt.?  Delilah Shan CMA Duncan Dull)  August 06, 2009 3:32 PM   I want ESR within the week please- thanks Additional Follow-up by: Anyah Part MD,  August 06, 2009 5:03 PM    Additional Follow-up for Phone Call Additional follow up Details #2::    Lab appointment scheduled:   today 08/07/2009 at 3:00 p.m.   She is asking if she could get iron studies done as well?  She says she has been eating better.  Lugene Fuquay CMA Duncan Dull)  August 07, 2009 9:59 AM   ok - add on cbc with diff/ ferritin 285.9- thanks  Follow-up by: Lawrence Part MD,  August 07, 2009 10:03 AM  Additional Follow-up for Phone Call Additional follow up Details #3:: Details for Additional Follow-up Action Taken: Cbc with diff/ferritin added on to labs to be done 08/07/09 at 3pm as instructed. Patient notified as instructed by telephone. Lewanda Rife LPN  August 07, 2009 10:09 AM

## 2010-06-17 NOTE — Procedures (Signed)
Summary: Colonoscopy  Patient: Ann Cummings Note: All result statuses are Final unless otherwise noted.  Tests: (1) Colonoscopy (COL)   COL Colonoscopy           DONE     [facility]     [address]     [place], [place]  [address]           COLONOSCOPY PROCEDURE REPORT           PATIENT:  Ann Cummings, Ann Cummings  MR#:  [id number]     BIRTHDATE:  12-08-1940, 69 yrs. old  GENDER:  female           ENDOSCOPIST:  Iva Boop, MD, [facility]           PROCEDURE DATE:  06/07/2009     PROCEDURE:  Colonoscopy with polypectomy and submucosal injection     ASA CLASS:  Class III     INDICATIONS:  FOBT positive stool, iron deficiency anemia (iFOBT)                 MEDICATIONS:   Fentanyl 75 mcg IV, Versed 10 mg IV           DESCRIPTION OF PROCEDURE:   After the risks benefits and     alternatives of the procedure were thoroughly explained, informed     consent was obtained.  Digital rectal exam was performed and     revealed no abnormalities.   The LB [id number] endoscope     was introduced through the anus and advanced to the terminal ileum     which was intubated for a short distance, without limitations.     The quality of the prep was excellent, using MoviPrep.  The     instrument was then slowly withdrawn as the colon was fully     examined.     Insertion: 4:44 minutes Withdrawal: 29:30 minutes     <<PROCEDUREIMAGES>>     FINDINGS:  The terminal ileum appeared normal.  A sessile polyp     was found in the ascending colon. It was 18 - 20 mm in size. Near     the hepatic flexure. Crossed a fold and very difficult to see and     position the scope. she was moved to her back for part of snaring     and then prone to complete. Site marked with SPOT submucosal     injection proximal and distal to site (2 injections). Polyp was     snared, then cauterized with monopolar cautery. Retrieval was     successful. snare polyp submucosal injection Piecemeal  Thi     s was otherwise a  normal examination of the colon.   Retroflexed     views in the rectum revealed internal hemorrhoids.    The scope     was then withdrawn from the patient and the procedure completed.           COMPLICATIONS:  None           ENDOSCOPIC IMPRESSION:     1) Normal terminal ileum     2) 18 - 20 mm sessile polyp in the ascending colon - removed and     site marked with submucosal SPOT     3) Internal hemorrhoids     4) Otherwise normal examination     RECOMMENDATIONS:     1) No aspirin or NSAID's for 2 weeks (HOLD Ibuprofen and     aspirin)  Start ferrous sulfate 325 mg bid (Ferritin was 8)     Need to consider EGD to assess iron def-anemia, will discuss     after biopsies in.           REPEAT EXAM:  In for Colonoscopy, pending biopsy results. 3-6     months likely to ensure complete removal of this polyp.           Iva Boop, MD, [facility]           CC:  Judy Pimple, MD     The Patient           n.     eSIGNED:   Iva Boop at 06/07/2009 01:00 PM           Larenda, Reedy, [id number]  Note: An exclamation mark (!) indicates a result that was not dispersed into the flowsheet. Document Creation Date: 06/07/2009 1:00 PM _______________________________________________________________________  (1) Order result status: Final Collection or observation date-time: 06/07/2009 12:47 Requested date-time:  Receipt date-time:  Reported date-time:  Referring Physician:   Ordering Physician: Stan Head [id number]) Specimen Source:  Source: Launa Grill Order Number: [id number] Lab site:   Appended Document: Colonoscopy     Procedures Next Due Date:    Colonoscopy: 12/2009

## 2010-06-17 NOTE — Assessment & Plan Note (Signed)
Summary: tower b/12/[facility]   Nurse Visit   Allergies: 1)  ! * Omega 3 Oils 2)  ! * Calcium 3)  ! * Vit D 4)  ! Neurontin 5)  ! Requip (Ropinirole Hcl) 6)  ! * Nasonex 7)  Ampicillin 8)  Codeine 9)  * Zyrtec 10)  * Phenylpropcinalone  Medication Administration  Injection # 1:    Medication: Vit B12 1000 mcg    Diagnosis: ANEMIA, B12 DEFICIENCY (ICD-281.1)    Route: IM    Site: R deltoid    Exp Date: 04/18/2011    Lot #: [id number]    Mfr: [employer] Regent    Patient tolerated injection without complications    Given by: Sydell Axon LPN (January 28, 2010 10:20 AM)  Orders Added: 1)  Vit B12 1000 mcg [J3420] 2)  Admin of Therapeutic Inj  intramuscular or subcutaneous [96372]   Medication Administration  Injection # 1:    Medication: Vit B12 1000 mcg    Diagnosis: ANEMIA, B12 DEFICIENCY (ICD-281.1)    Route: IM    Site: R deltoid    Exp Date: 04/18/2011    Lot #: [id number]    Mfr: [employer] Regent    Patient tolerated injection without complications    Given by: Sydell Axon LPN (January 28, 2010 10:20 AM)  Orders Added: 1)  Vit B12 1000 mcg [J3420] 2)  Admin of Therapeutic Inj  intramuscular or subcutaneous [[id number]]

## 2010-06-19 NOTE — Assessment & Plan Note (Signed)
Summary: TOWER B/12/[facility]   Nurse Visit   Allergies: 1)  ! * Omega 3 Oils 2)  ! * Calcium 3)  ! * Vit D 4)  ! Neurontin 5)  ! Requip (Ropinirole Hcl) 6)  ! * Nasonex 7)  Ampicillin 8)  Codeine 9)  * Zyrtec 10)  * Phenylpropcinalone  Medication Administration  Injection # 1:    Medication: Vit B12 1000 mcg    Diagnosis: ANEMIA, B12 DEFICIENCY (ICD-281.1)    Route: IM    Site: R deltoid    Exp Date: 02/16/2012    Lot #: [id number]    Mfr: [employer] Regent    Patient tolerated injection without complications    Given by: Linde Gillis CMA (AAMA) (May 23, 2010 11:00 AM)  Orders Added: 1)  Vit B12 1000 mcg [J3420] 2)  Admin of Therapeutic Inj  intramuscular or subcutaneous [[id number]]

## 2010-06-23 ENCOUNTER — Ambulatory Visit (INDEPENDENT_AMBULATORY_CARE_PROVIDER_SITE_OTHER): Payer: Medicare Other | Admitting: Family Medicine

## 2010-06-23 ENCOUNTER — Other Ambulatory Visit: Payer: Self-pay | Admitting: Family Medicine

## 2010-06-23 ENCOUNTER — Encounter: Payer: Self-pay | Admitting: Family Medicine

## 2010-06-23 DIAGNOSIS — D509 Iron deficiency anemia, unspecified: Secondary | ICD-10-CM

## 2010-06-23 DIAGNOSIS — E7800 Pure hypercholesterolemia, unspecified: Secondary | ICD-10-CM

## 2010-06-23 DIAGNOSIS — D518 Other vitamin B12 deficiency anemias: Secondary | ICD-10-CM

## 2010-06-23 DIAGNOSIS — E785 Hyperlipidemia, unspecified: Secondary | ICD-10-CM

## 2010-06-23 DIAGNOSIS — I10 Essential (primary) hypertension: Secondary | ICD-10-CM

## 2010-06-23 DIAGNOSIS — R197 Diarrhea, unspecified: Secondary | ICD-10-CM

## 2010-06-23 DIAGNOSIS — F329 Major depressive disorder, single episode, unspecified: Secondary | ICD-10-CM

## 2010-06-23 DIAGNOSIS — R7309 Other abnormal glucose: Secondary | ICD-10-CM

## 2010-06-23 DIAGNOSIS — K589 Irritable bowel syndrome without diarrhea: Secondary | ICD-10-CM

## 2010-06-23 DIAGNOSIS — G2589 Other specified extrapyramidal and movement disorders: Secondary | ICD-10-CM

## 2010-06-23 LAB — HEPATIC FUNCTION PANEL
Alkaline Phosphatase: 50 U/L (ref 39–117)
Bilirubin, Direct: 0.1 mg/dL (ref 0.0–0.3)
Total Bilirubin: 0.2 mg/dL — ABNORMAL LOW (ref 0.3–1.2)
Total Protein: 6.8 g/dL (ref 6.0–8.3)

## 2010-06-23 LAB — CBC WITH DIFFERENTIAL/PLATELET
Basophils Relative: 0.4 % (ref 0.0–3.0)
Eosinophils Absolute: 0.4 10*3/uL (ref 0.0–0.7)
Eosinophils Relative: 3.5 % (ref 0.0–5.0)
Lymphocytes Relative: 14.6 % (ref 12.0–46.0)
MCHC: 33.9 g/dL (ref 30.0–36.0)
Monocytes Absolute: 0.6 10*3/uL (ref 0.1–1.0)
Neutrophils Relative %: 75.5 % (ref 43.0–77.0)
Platelets: 285 10*3/uL (ref 150.0–400.0)
RBC: 3.99 Mil/uL (ref 3.87–5.11)
WBC: 10.2 10*3/uL (ref 4.5–10.5)

## 2010-06-23 LAB — RENAL FUNCTION PANEL
Creatinine, Ser: 1.4 mg/dL — ABNORMAL HIGH (ref 0.4–1.2)
Glucose, Bld: 88 mg/dL (ref 70–99)
Phosphorus: 3.8 mg/dL (ref 2.3–4.6)
Potassium: 4.4 mEq/L (ref 3.5–5.1)
Sodium: 141 mEq/L (ref 135–145)

## 2010-06-23 LAB — LIPID PANEL
HDL: 43.5 mg/dL (ref >39.00)
Total CHOL/HDL Ratio: 4
Triglycerides: 329 mg/dL — ABNORMAL HIGH (ref 0.0–149.0)
VLDL: 65.8 mg/dL — ABNORMAL HIGH (ref 0.0–40.0)

## 2010-06-23 LAB — VITAMIN B12: Vitamin B-12: >1500 pg/mL — ABNORMAL HIGH (ref 211–911)

## 2010-06-23 LAB — HEMOGLOBIN A1C: Hgb A1c MFr Bld: 6.3 % (ref 4.6–6.5)
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
## 2010-07-08 ENCOUNTER — Ambulatory Visit (INDEPENDENT_AMBULATORY_CARE_PROVIDER_SITE_OTHER): Payer: Medicare Other | Admitting: Family Medicine

## 2010-07-08 ENCOUNTER — Other Ambulatory Visit: Payer: Self-pay | Admitting: Family Medicine

## 2010-07-08 ENCOUNTER — Ambulatory Visit: Payer: Medicare Other | Admitting: Family Medicine

## 2010-07-08 ENCOUNTER — Encounter: Payer: Self-pay | Admitting: Family Medicine

## 2010-07-08 DIAGNOSIS — E7800 Pure hypercholesterolemia, unspecified: Secondary | ICD-10-CM

## 2010-07-08 DIAGNOSIS — I10 Essential (primary) hypertension: Secondary | ICD-10-CM

## 2010-07-08 DIAGNOSIS — D518 Other vitamin B12 deficiency anemias: Secondary | ICD-10-CM

## 2010-07-08 DIAGNOSIS — R7309 Other abnormal glucose: Secondary | ICD-10-CM

## 2010-07-08 DIAGNOSIS — D509 Iron deficiency anemia, unspecified: Secondary | ICD-10-CM

## 2010-07-08 DIAGNOSIS — N259 Disorder resulting from impaired renal tubular function, unspecified: Secondary | ICD-10-CM

## 2010-07-08 DIAGNOSIS — N390 Urinary tract infection, site not specified: Secondary | ICD-10-CM

## 2010-07-08 DIAGNOSIS — N289 Disorder of kidney and ureter, unspecified: Secondary | ICD-10-CM | POA: Insufficient documentation

## 2010-07-08 LAB — CONVERTED CEMR LAB
Glucose, Urine, Semiquant: NEGATIVE
Ketones, urine, test strip: NEGATIVE
Specific Gravity, Urine: 1.02
Yeast, UA: 0

## 2010-07-09 LAB — RENAL FUNCTION PANEL
CO2: 27 mEq/L (ref 19–32)
Chloride: 106 mEq/L (ref 96–112)
Creatinine, Ser: 1.3 mg/dL — ABNORMAL HIGH (ref 0.4–1.2)
GFR: 43.67 mL/min — ABNORMAL LOW (ref >60.00)
Potassium: 4.3 mEq/L (ref 3.5–5.1)
Sodium: 141 mEq/L (ref 135–145)

## 2010-07-09 NOTE — Miscellaneous (Signed)
Summary: Controlled Substance Agreement  Controlled Substance Agreement   Imported By: Lanelle Bal 07/03/2010 08:58:20  _____________________________________________________________________  External Attachment:    Type:   Image     Comment:   External Document
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
## 2010-07-24 ENCOUNTER — Other Ambulatory Visit: Payer: Self-pay | Admitting: Family Medicine

## 2010-07-24 ENCOUNTER — Other Ambulatory Visit (INDEPENDENT_AMBULATORY_CARE_PROVIDER_SITE_OTHER): Payer: Medicare Other

## 2010-07-24 ENCOUNTER — Encounter (INDEPENDENT_AMBULATORY_CARE_PROVIDER_SITE_OTHER): Payer: Self-pay | Admitting: *Deleted

## 2010-07-24 DIAGNOSIS — N259 Disorder resulting from impaired renal tubular function, unspecified: Secondary | ICD-10-CM

## 2010-07-24 LAB — RENAL FUNCTION PANEL
Albumin: 4.3 g/dL (ref 3.5–5.2)
BUN: 26 mg/dL — ABNORMAL HIGH (ref 6–23)
CO2: 29 mEq/L (ref 19–32)
Calcium: 9.5 mg/dL (ref 8.4–10.5)
Chloride: 99 mEq/L (ref 96–112)
Creatinine, Ser: 1.2 mg/dL (ref 0.4–1.2)
GFR: 47.46 mL/min — ABNORMAL LOW (ref >60.00)
Glucose, Bld: 103 mg/dL — ABNORMAL HIGH (ref 70–99)
Phosphorus: 3.9 mg/dL (ref 2.3–4.6)
Potassium: 4.2 mEq/L (ref 3.5–5.1)
Sodium: 137 mEq/L (ref 135–145)

## 2010-07-29 LAB — DIFFERENTIAL
Lymphocytes Relative: 15 % (ref 12–46)
Lymphs Abs: 1.3 10*3/uL (ref 0.7–4.0)
Monocytes Relative: 8 % (ref 3–12)
Neutro Abs: 6.4 10*3/uL (ref 1.7–7.7)
Neutrophils Relative %: 75 % (ref 43–77)

## 2010-07-29 LAB — CBC
Hemoglobin: 11.3 g/dL — ABNORMAL LOW (ref 12.0–15.0)
MCH: 29 pg (ref 26.0–34.0)
RBC: 3.9 MIL/uL (ref 3.87–5.11)
WBC: 8.6 10*3/uL (ref 4.0–10.5)

## 2010-07-29 LAB — GLUCOSE, CAPILLARY: Glucose-Capillary: 128 mg/dL — ABNORMAL HIGH (ref 70–99)

## 2010-07-29 LAB — FERRITIN: Ferritin: 24 ng/mL (ref 10–291)

## 2010-08-05 LAB — GLUCOSE, CAPILLARY
Glucose-Capillary: 105 mg/dL — ABNORMAL HIGH (ref 70–99)
Glucose-Capillary: 136 mg/dL — ABNORMAL HIGH (ref 70–99)

## 2010-08-06 LAB — DIFFERENTIAL
Basophils Relative: 0 % (ref 0–1)
Eosinophils Absolute: 0.4 10*3/uL (ref 0.0–0.7)
Eosinophils Relative: 4 % (ref 0–5)
Lymphs Abs: 2.1 10*3/uL (ref 0.7–4.0)

## 2010-08-06 LAB — CBC
HCT: 36.6 % (ref 36.0–46.0)
MCHC: 33.6 g/dL (ref 30.0–36.0)
MCV: 83.9 fL (ref 78.0–100.0)
Platelets: 312 10*3/uL (ref 150–400)
WBC: 10.9 10*3/uL — ABNORMAL HIGH (ref 4.0–10.5)

## 2010-08-06 LAB — BASIC METABOLIC PANEL
BUN: 15 mg/dL (ref 6–23)
CO2: 30 mEq/L (ref 19–32)
Chloride: 101 mEq/L (ref 96–112)
Creatinine, Ser: 1.02 mg/dL (ref 0.4–1.2)
Glucose, Bld: 94 mg/dL (ref 70–99)
Potassium: 4 mEq/L (ref 3.5–5.1)

## 2010-08-06 LAB — ABO/RH: ABO/RH(D): A POS

## 2010-08-06 LAB — SURGICAL PCR SCREEN: Staphylococcus aureus: POSITIVE — AB

## 2010-08-06 LAB — TYPE AND SCREEN

## 2010-08-12 ENCOUNTER — Ambulatory Visit (INDEPENDENT_AMBULATORY_CARE_PROVIDER_SITE_OTHER): Payer: Medicare Other | Admitting: Family Medicine

## 2010-08-12 DIAGNOSIS — D519 Vitamin B12 deficiency anemia, unspecified: Secondary | ICD-10-CM

## 2010-08-12 DIAGNOSIS — D518 Other vitamin B12 deficiency anemias: Secondary | ICD-10-CM

## 2010-08-12 MED ORDER — CYANOCOBALAMIN 1000 MCG/ML IJ SOLN
1000.0000 ug | INTRAMUSCULAR | Status: DC
  Administered 2010-08-12 – 2011-10-06 (×3): 1000 ug via INTRAMUSCULAR

## 2010-08-13 NOTE — Progress Notes (Signed)
  Subjective:    Patient ID: Ann Cummings, female    DOB: 1940/09/07, 70 y.o.   MRN: [id number]  HPI    Review of Systems     Objective:   Physical Exam        Assessment & Plan:

## 2010-09-18 ENCOUNTER — Ambulatory Visit (INDEPENDENT_AMBULATORY_CARE_PROVIDER_SITE_OTHER): Payer: Medicare Other | Admitting: Family Medicine

## 2010-09-18 DIAGNOSIS — D518 Other vitamin B12 deficiency anemias: Secondary | ICD-10-CM

## 2010-09-18 NOTE — Progress Notes (Signed)
  Subjective:    Patient ID: Ann Cummings, female    DOB: 1941-04-08, 70 y.o.   MRN: [id number]  HPI  Here for inj  Review of Systems     Objective:   Physical Exam        Assessment & Plan:

## 2010-09-21 ENCOUNTER — Other Ambulatory Visit: Payer: Self-pay | Admitting: Family Medicine

## 2010-09-23 ENCOUNTER — Encounter: Payer: Self-pay | Admitting: Family Medicine

## 2010-09-29 ENCOUNTER — Other Ambulatory Visit: Payer: Self-pay | Admitting: Family Medicine

## 2010-10-03 NOTE — Op Note (Signed)
NAME:  Ann Cummings, Ann Cummings                          ACCOUNT NO.:  [id number]   MEDICAL RECORD NO.:  [id number]                   PATIENT TYPE:  INP   LOCATION:  2899                                 FACILITY:  [facility]   PHYSICIAN:  Kathaleen Maser. Pool, M.D.                 DATE OF BIRTH:  [birth year]   DATE OF PROCEDURE:  07/28/2002  DATE OF DISCHARGE:                                 OPERATIVE REPORT   SERVICE:  Neurosurgery.   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   PROCEDURE:  L4-5 decompressive laminectomy with foraminotomies.  L4-5  posterior lumbar interbody fusion utilizing tangent wedges and local  autograft.  L4-5 posterolateral fusion utilizing pedicle screw fixation and  local allograft.   SURGEON:  Kathaleen Maser. Pool, M.D.   ASSISTANT:  Reinaldo Meeker, M.D.   ANESTHESIA:  General endotracheal.   INDICATIONS:  [name] is a 70 year old female with history of back and  bilateral lower extremity pain, left greater than right.  She has evidence  of chronic L4 and L5 radiculopathy, failing all conservative management.  She has transitional anatomy with the second disk space from the bottom  being labeled L4-5, demonstrating moderately severe degenerative disk  disease with significant facet arthropathy and foraminal stenosis.  There  are present synovial cysts bilaterally at L4-5.  We discussed the options of  open management, including the possibility of undergoing an aggressive  decompression and fusion with instrumentation for improvement of symptoms.  The patient is aware of the risks and benefits of the procedure, and she  wishes to proceed.   OPERATIVE NOTE:  The patient brought to the operating room and placed on the  table in the supine position.  After an adequate level of anesthesia was  achieved, the patient was placed prone  onto the Wilson frame and properly  padded.  The patient's lumbar region was prepped and draped sterilely.  A 10  blade was used to make a linear skin incision overlying the L4-5 interspace.  This was entered sharply in the midline.  Subperiosteal dissection was then  performed, exposing the lamina of the facet joints of L4 and L5, as well as  the transverse processes of L4 and L5.  Deep self-retaining retractor was  placed.  Intraoperative fluoroscopy was used and the level was confirmed.  A  decompressive laminectomy at L4-5 was then performed using Leksell rongeurs,  Kerrison rongeurs, and the high-speed drill to remove the entire lamina of  L4, the entire anterior facets of L4 bilaterally, and the majority of the  superior facets of L5 bilaterally.  All bone was cleaned and used in later  autograft.  The ligamentum flavum was then elevated and dissected in the  usual fashion using Kerrison rongeurs.  The underlying thecal sac and  exiting L4 and L5 nerve roots were identified bilaterally.  There was  evidence of bilateral synovial cysts, which were completely resected as  well.  Epidural venous plexus coagulated and cut.  Bilateral diskectomies  were then performed.  The disk space was then distracted up to 10 mm, and a  10 mm distractor was left in the patient's right side.  With the thecal sac  and nerve roots protected, the disk spaces were then reamed.  They were then  cut with a 10 mm chisel on the patient's left side.  The soft tissues were  removed from the interspace.  A 10 x 26 mm tangent wedge was then impacted  into place and recessed approximately 10 mm from the posterior cortical  margin.  The distractor was removed from the patient's right side.  The  procedure was then repeated on the right side, again reaming and cutting the  disk space with a 10 mm chisel.  The soft tissue was removed.  The disk  space was further curettaged.  Morselized autograft was then packed in  the  interspace.  A second 10 x 26 mm tangent wedge was then impacted into place  and recessed approximately 2 mm from the posterior cortical margin.  The  pedicles of L4 and L5 were then isolated using surface landmarks and  intraoperative fluoroscopy.  Each pedicle was then probed using a pedicle  awl.  Each pedicle awl track was found to be solid in bone.  Each pedicle  awl track was then tapped with five 0.25 mm screw tap.  Each screw tap hole  was found to be solid in bone.  5.75 x 40 mm spiral 90 screws were then used  bilaterally at L4 and L5.  All four screws were found to be solid in bone  with very good purchase.  Transverse processes of L4 and L5 were then  decorticated using a high-speed drill.  Morselized autograft was packed  posterolaterally.  A short segment titanium rod was then contoured and  placed with the superior edge at L4 and L5.  The locking caps were then  placed under the screw heads.  The locking caps were then engaged in a  sequential fashion with the construct under compression.  Final images  revealed good position of bone grafts and hardware, proper operative level  and normal alignment of spine.  A blunt probe was passed easily along the  course of the exiting nerve roots.  There was no evidence of injury to the  thecal sac or nerve roots.  The wound was then irrigated one final time.  Gelfoam was placed topically for hemostasis and found to be good.  A medium  Hemovac drain was left in the epidural space.  The wound was then closed in  layers with Vicryl sutures.  Steri-Strips and a sterile dressing were  applied.  There were no operative complications. The patient tolerated this  well and she returned to the recovery room postop.                                               Henry A. Pool, M.D.    HAP/MEDQ  D:  07/28/2002  T:  07/29/2002  Job:  [id number]

## 2010-10-03 NOTE — Op Note (Signed)
Ann Cummings, Ann Cummings                ACCOUNT NO.:  [id number]   MEDICAL RECORD NO.:  [id number]          PATIENT TYPE:  AMB   LOCATION:  DSC                          FACILITY:  [facility]   PHYSICIAN:  Lowell Bouton, M.D.DATE OF BIRTH:  Jun 19, 1940   DATE OF PROCEDURE:  02/17/2006  DATE OF DISCHARGE:                                 OPERATIVE REPORT   PREOPERATIVE DIAGNOSIS:  1. Left carpal tunnel syndrome.  2. Right small trigger finger.   POSTOPERATIVE DIAGNOSIS:  1. Left carpal tunnel syndrome.  2. Right small trigger finger.   PROCEDURE:  1. Decompression median nerve left carpal tunnel.  2. Release of A1 pulley right small finger.   SURGEON:  Lowell Bouton, M.D.   ANESTHESIA:  0.5% Marcaine local with sedation.   OPERATIVE FINDINGS:  The patient had a transverse muscle deep to the  transverse carpal ligament on the left.  The motor branch of the nerve was  intact.  The right small finger had some yellowish fluid in the tendon  sheath.   PROCEDURE:  Under 0.5% Marcaine local anesthesia, a tourniquet was placed on  the right forearm and the right hand procedure was performed first.  After  elevating the limb, the tourniquet was inflated to 275 mmHg.  A transverse  incision was made in line with the small finger in the distal palmar crease.  It was carried through the subcutaneous tissues.  Blunt dissection was  carried down to the flexor tendon sheath and the A1 pulley was incised  sharply.  It was completely released with the scissors.  There was some  yellowish fluid in the tendon sheath.  With flexion and extension, there was  no further triggering.  The wound was irrigated with saline and the skin was  closed with 4-0 nylon sutures.  Sterile dressings were applied and the  tourniquet was released on the right forearm.   The left hand was then prepped and draped in the usual fashion. After  exsanguinating the limb, the tourniquet was inflated to 275  mmHg on the left  arm.  A 3 cm longitudinal incision was made in the palm just ulnar to the  thenar crease and carried through the subcutaneous tissues.  Blunt  dissection was carried through the superficial palmar fascia and there was  noted to be a transverse muscle, both superficial and deep to the transverse  carpal ligament.  It was incised longitudinally.  A hemostat was then placed  in the carpal canal against the hook of the hamate and the transverse carpal  ligament was divided on the ulnar border of the median nerve.  The proximal  end of the ligament was divided with the scissors after dissecting the nerve  away from the under surface of the ligament.  The deep muscle, also, was  transected.  The motor branch was then identified and was found to be  intact.  The wound was  irrigated with saline.  The skin was closed with 4-0 nylon sutures.  Sterile  dressings were applied followed by a volar wrist splint.  The patient  tolerated the procedure well and went to the recovery room awake in stable  good condition.      Lowell Bouton, M.D.  Electronically Signed     EMM/MEDQ  D:  02/17/2006  T:  02/18/2006  Job:  [id number]   cc:   Marne A. Milinda Antis, MD

## 2010-10-21 ENCOUNTER — Other Ambulatory Visit: Payer: Self-pay | Admitting: *Deleted

## 2010-10-21 MED ORDER — CYCLOBENZAPRINE HCL 10 MG PO TABS: ORAL_TABLET | ORAL | Status: DC

## 2010-10-21 NOTE — Telephone Encounter (Signed)
Med rx was escribed to [facility] as instructed.

## 2010-10-21 NOTE — Telephone Encounter (Signed)
Px written for call in   

## 2010-10-22 ENCOUNTER — Other Ambulatory Visit: Payer: Self-pay | Admitting: Family Medicine

## 2010-10-23 NOTE — Telephone Encounter (Signed)
Will route to PCP 

## 2010-10-23 NOTE — Telephone Encounter (Signed)
Was this not just done on 6/5?

## 2010-10-29 ENCOUNTER — Ambulatory Visit (INDEPENDENT_AMBULATORY_CARE_PROVIDER_SITE_OTHER): Payer: Medicare Other | Admitting: Family Medicine

## 2010-10-29 DIAGNOSIS — E538 Deficiency of other specified B group vitamins: Secondary | ICD-10-CM

## 2010-10-29 MED ORDER — CYANOCOBALAMIN 1000 MCG/ML IJ SOLN
1000.0000 ug | Freq: Once | INTRAMUSCULAR | Status: AC
  Administered 2010-10-29: 1000 ug via INTRAMUSCULAR

## 2010-11-16 ENCOUNTER — Other Ambulatory Visit: Payer: Self-pay | Admitting: Family Medicine

## 2010-11-17 NOTE — Telephone Encounter (Signed)
[facility] electronically sent refill for Lotrel 10-20. Taking 1 capsule daily. Oked #90. Pt already has appt scheduled with Dr Milinda Antis on 12/22/10.

## 2010-12-01 ENCOUNTER — Other Ambulatory Visit: Payer: Self-pay | Admitting: Family Medicine

## 2010-12-01 NOTE — Telephone Encounter (Signed)
[facility] sent electronically sent refill note for Maxzide 37.5-25mg  with instructions to take 2 tabs once daily in AM. The last time pt was seen 07/08/10 the pt instructions said to continue 1/2 Maxzide but med list read as the rx sent from [facility]. I called pt and pt said takes Maxzide 37.5-25 mg one tablet by mouth daily. Please advise.

## 2010-12-01 NOTE — Telephone Encounter (Signed)
I DID intend for her to take 1/2 pill daily due to kidney function If she has been taking 1 pill daily - that is what I will refil -- and when she is here in early august for appt will see how bp and renal fxn are If necessary will decrease this  Will send px electronically

## 2010-12-18 ENCOUNTER — Encounter: Payer: Self-pay | Admitting: Family Medicine

## 2010-12-22 ENCOUNTER — Ambulatory Visit (INDEPENDENT_AMBULATORY_CARE_PROVIDER_SITE_OTHER): Payer: Medicare Other | Admitting: Family Medicine

## 2010-12-22 ENCOUNTER — Encounter: Payer: Self-pay | Admitting: Family Medicine

## 2010-12-22 DIAGNOSIS — E7800 Pure hypercholesterolemia, unspecified: Secondary | ICD-10-CM

## 2010-12-22 DIAGNOSIS — I10 Essential (primary) hypertension: Secondary | ICD-10-CM

## 2010-12-22 DIAGNOSIS — R7309 Other abnormal glucose: Secondary | ICD-10-CM

## 2010-12-22 DIAGNOSIS — D518 Other vitamin B12 deficiency anemias: Secondary | ICD-10-CM

## 2010-12-22 DIAGNOSIS — N259 Disorder resulting from impaired renal tubular function, unspecified: Secondary | ICD-10-CM

## 2010-12-22 DIAGNOSIS — W57XXXA Bitten or stung by nonvenomous insect and other nonvenomous arthropods, initial encounter: Secondary | ICD-10-CM

## 2010-12-22 LAB — RENAL FUNCTION PANEL
Albumin: 4.4 g/dL (ref 3.5–5.2)
CO2: 25 mEq/L (ref 19–32)
Calcium: 9 mg/dL (ref 8.4–10.5)
Creatinine, Ser: 1.2 mg/dL (ref 0.4–1.2)
Sodium: 133 mEq/L — ABNORMAL LOW (ref 135–145)

## 2010-12-22 LAB — VITAMIN B12: Vitamin B-12: 892 pg/mL (ref 211–911)

## 2010-12-22 MED ORDER — CYANOCOBALAMIN 1000 MCG/ML IJ SOLN
1000.0000 ug | Freq: Once | INTRAMUSCULAR | Status: AC
  Administered 2010-12-22: 1000 ug via INTRAMUSCULAR

## 2010-12-22 NOTE — Assessment & Plan Note (Signed)
Good control with crestor and fair diet  Rev last labs

## 2010-12-22 NOTE — Assessment & Plan Note (Signed)
a1c today Expect high with recent prednisone and wt gain Disc low glycemic diet

## 2010-12-22 NOTE — Patient Instructions (Addendum)
Try otc cortisone cream on tick bite/ keep it clean - alert me if fever or other symptoms  Try antifungal cream for foot Get citrucel fiber supplement and drink mixed in fluid once per day  Labs today  Work on regular walking and weight loss B12 shot today

## 2010-12-22 NOTE — Assessment & Plan Note (Signed)
Pt took suppl and shots  Will check level Then shot today Likely just needs the shots

## 2010-12-22 NOTE — Assessment & Plan Note (Signed)
Lab today Disc imp of good water intake

## 2010-12-22 NOTE — Assessment & Plan Note (Signed)
Controlled with no change current meds Lab today Then decide f/u
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
## 2010-12-23 LAB — HEMOGLOBIN A1C: Hgb A1c MFr Bld: 6.4 % (ref 4.6–6.5)

## 2010-12-24 ENCOUNTER — Telehealth: Payer: Self-pay

## 2010-12-24 ENCOUNTER — Other Ambulatory Visit: Payer: Self-pay | Admitting: Family Medicine

## 2010-12-24 DIAGNOSIS — E871 Hypo-osmolality and hyponatremia: Secondary | ICD-10-CM

## 2010-12-24 NOTE — Telephone Encounter (Signed)
Message copied by Patience Musca on Wed Dec 24, 2010  9:30 AM ------      Message from: Roxy Manns A      Created: Wed Dec 24, 2010  8:33 AM       Sodium level is a little low- this could be from her diuretic / fluid pill - re check sodium for hyponatremia in 1 month please       Other labs stable      No change in kidney function      B12 is fine with shots       Sugar control just went up a tiny bit - that is reassuring (expected worse after prednisone )       F/u with me in about 6 mo unless needed earlier

## 2010-12-24 NOTE — Telephone Encounter (Signed)
Patient notified as instructed by telephone. Lab appt scheduled as instructed on 01/26/11 at 11am. Pt will call back for f/u appt 6 months or sooner if pt needs to be seen. Pt also wanted Dr Milinda Antis to know that the neurologist wants pt to take the Flexeril twice a day to help prevent the headaches. Med list updated.

## 2010-12-25 DIAGNOSIS — W57XXXA Bitten or stung by nonvenomous insect and other nonvenomous arthropods, initial encounter: Secondary | ICD-10-CM | POA: Insufficient documentation

## 2011-01-01 ENCOUNTER — Other Ambulatory Visit: Payer: Self-pay | Admitting: Family Medicine

## 2011-01-01 NOTE — Telephone Encounter (Signed)
Will refill electronically  

## 2011-01-26 ENCOUNTER — Other Ambulatory Visit (INDEPENDENT_AMBULATORY_CARE_PROVIDER_SITE_OTHER): Payer: Medicare Other

## 2011-01-26 DIAGNOSIS — E871 Hypo-osmolality and hyponatremia: Secondary | ICD-10-CM

## 2011-01-28 ENCOUNTER — Other Ambulatory Visit: Payer: Self-pay | Admitting: Family Medicine

## 2011-01-28 ENCOUNTER — Other Ambulatory Visit: Payer: Self-pay | Admitting: Internal Medicine

## 2011-01-29 NOTE — Telephone Encounter (Signed)
Will refill electronically  

## 2011-01-29 NOTE — Telephone Encounter (Signed)
[facility] electronically request refill Celexa 20 mg. Last refill 09/30/10.Please advise.

## 2011-02-07 ENCOUNTER — Other Ambulatory Visit: Payer: Self-pay | Admitting: Family Medicine

## 2011-02-09 NOTE — Telephone Encounter (Signed)
[facility] electronically request refill Dyazide 37.5-25 mg # 90 X 2.

## 2011-02-12 ENCOUNTER — Telehealth: Payer: Self-pay | Admitting: *Deleted

## 2011-02-12 NOTE — Telephone Encounter (Signed)
Forms for diabetic supplies, back brace and foot orthosis are on your shelf.  I spoke to the patient and she says she does want these supplies.  The company told her that medicare will pay for them, and what medicare doesn't pay is absorbed by the company.

## 2011-02-13 NOTE — Telephone Encounter (Signed)
Pt has changed her mind, doesn't want orthosis or back brace, only wants diabetic supplies.  That form faxed back in.

## 2011-02-13 NOTE — Telephone Encounter (Signed)
Forms are done and in IN box

## 2011-02-14 ENCOUNTER — Other Ambulatory Visit: Payer: Self-pay | Admitting: Family Medicine

## 2011-02-25 ENCOUNTER — Ambulatory Visit (INDEPENDENT_AMBULATORY_CARE_PROVIDER_SITE_OTHER): Payer: Medicare Other | Admitting: *Deleted

## 2011-02-25 DIAGNOSIS — E538 Deficiency of other specified B group vitamins: Secondary | ICD-10-CM

## 2011-02-25 MED ORDER — CYANOCOBALAMIN 1000 MCG/ML IJ SOLN
1000.0000 ug | Freq: Once | INTRAMUSCULAR | Status: AC
  Administered 2011-02-25: 1000 ug via INTRAMUSCULAR

## 2011-04-03 ENCOUNTER — Ambulatory Visit (INDEPENDENT_AMBULATORY_CARE_PROVIDER_SITE_OTHER): Payer: Medicare Other | Admitting: *Deleted

## 2011-04-03 DIAGNOSIS — E538 Deficiency of other specified B group vitamins: Secondary | ICD-10-CM

## 2011-04-03 MED ORDER — CYANOCOBALAMIN 1000 MCG/ML IJ SOLN
1000.0000 ug | Freq: Once | INTRAMUSCULAR | Status: AC
  Administered 2011-04-03: 1000 ug via INTRAMUSCULAR

## 2011-04-03 NOTE — Progress Notes (Signed)
B12 injection given during nurse visit today. 

## 2011-05-08 ENCOUNTER — Ambulatory Visit (INDEPENDENT_AMBULATORY_CARE_PROVIDER_SITE_OTHER): Payer: Medicare Other | Admitting: *Deleted

## 2011-05-08 DIAGNOSIS — E538 Deficiency of other specified B group vitamins: Secondary | ICD-10-CM

## 2011-05-08 MED ORDER — CYANOCOBALAMIN 1000 MCG/ML IJ SOLN
1000.0000 ug | Freq: Once | INTRAMUSCULAR | Status: AC
  Administered 2011-05-08: 1000 ug via INTRAMUSCULAR

## 2011-05-20 DIAGNOSIS — M316 Other giant cell arteritis: Secondary | ICD-10-CM | POA: Diagnosis not present

## 2011-05-20 DIAGNOSIS — G43909 Migraine, unspecified, not intractable, without status migrainosus: Secondary | ICD-10-CM | POA: Diagnosis not present

## 2011-05-20 DIAGNOSIS — M48061 Spinal stenosis, lumbar region without neurogenic claudication: Secondary | ICD-10-CM | POA: Diagnosis not present

## 2011-05-26 ENCOUNTER — Other Ambulatory Visit: Payer: Self-pay

## 2011-05-26 MED ORDER — CYCLOBENZAPRINE HCL 10 MG PO TABS: 10.0000 mg | ORAL_TABLET | Freq: Three times a day (TID) | ORAL | Status: DC | PRN

## 2011-05-26 NOTE — Telephone Encounter (Signed)
Form in IN box  Max number for 3 mo is 270 (max is tid)

## 2011-05-26 NOTE — Telephone Encounter (Signed)
[facility] faxed refill request cyclobenzaprine 10 mg. Form is on your shelf in the in box for completion.

## 2011-05-26 NOTE — Telephone Encounter (Signed)
completed from faxed to [phone] as instructed.

## 2011-05-27 ENCOUNTER — Other Ambulatory Visit: Payer: Self-pay | Admitting: Internal Medicine

## 2011-05-27 DIAGNOSIS — D1800 Hemangioma unspecified site: Secondary | ICD-10-CM | POA: Diagnosis not present

## 2011-05-27 DIAGNOSIS — L408 Other psoriasis: Secondary | ICD-10-CM | POA: Diagnosis not present

## 2011-05-27 MED ORDER — GLUCOSE BLOOD VI STRP: ORAL_STRIP | Status: DC

## 2011-05-27 NOTE — Telephone Encounter (Signed)
Faxed glucose strips to [facility].

## 2011-05-29 ENCOUNTER — Other Ambulatory Visit: Payer: Self-pay | Admitting: Internal Medicine

## 2011-05-29 MED ORDER — ONETOUCH FINEPOINT LANCETS MISC: 1.0000 "application " | Freq: Two times a day (BID) | Status: DC

## 2011-05-29 NOTE — Telephone Encounter (Signed)
Rx sent to pharmacy   

## 2011-06-09 ENCOUNTER — Other Ambulatory Visit: Payer: Self-pay | Admitting: *Deleted

## 2011-06-09 MED ORDER — AMLODIPINE BESY-BENAZEPRIL HCL 10-20 MG PO CAPS: 1.0000 | ORAL_CAPSULE | Freq: Every day | ORAL | Status: DC

## 2011-06-09 MED ORDER — TRIAMTERENE-HCTZ 37.5-25 MG PO CAPS: 1.0000 | ORAL_CAPSULE | Freq: Every day | ORAL | Status: DC

## 2011-06-30 ENCOUNTER — Other Ambulatory Visit: Payer: Self-pay | Admitting: *Deleted

## 2011-06-30 MED ORDER — DICYCLOMINE HCL 20 MG PO TABS: ORAL_TABLET | ORAL | Status: DC

## 2011-06-30 MED ORDER — CYCLOBENZAPRINE HCL 10 MG PO TABS: 10.0000 mg | ORAL_TABLET | Freq: Three times a day (TID) | ORAL | Status: DC | PRN

## 2011-06-30 MED ORDER — OMEPRAZOLE 20 MG PO CPDR: 20.0000 mg | DELAYED_RELEASE_CAPSULE | Freq: Every day | ORAL | Status: DC

## 2011-06-30 NOTE — Telephone Encounter (Signed)
I filled out form to fax today also Will refill electronically

## 2011-07-06 ENCOUNTER — Other Ambulatory Visit: Payer: Self-pay

## 2011-07-06 MED ORDER — ROSUVASTATIN CALCIUM 10 MG PO TABS: 5.0000 mg | ORAL_TABLET | Freq: Every day | ORAL | Status: DC

## 2011-07-06 NOTE — Telephone Encounter (Signed)
[facility] faxed request Crestor 10 mg #45 x 3.

## 2011-07-13 ENCOUNTER — Other Ambulatory Visit: Payer: Self-pay | Admitting: *Deleted

## 2011-07-13 MED ORDER — ATENOLOL 25 MG PO TABS: ORAL_TABLET | ORAL | Status: DC

## 2011-07-15 ENCOUNTER — Ambulatory Visit (INDEPENDENT_AMBULATORY_CARE_PROVIDER_SITE_OTHER): Payer: Medicare Other | Admitting: *Deleted

## 2011-07-15 DIAGNOSIS — D518 Other vitamin B12 deficiency anemias: Secondary | ICD-10-CM | POA: Diagnosis not present

## 2011-07-15 MED ORDER — CYANOCOBALAMIN 1000 MCG/ML IJ SOLN
1000.0000 ug | Freq: Once | INTRAMUSCULAR | Status: AC
  Administered 2011-07-15: 1000 ug via INTRAMUSCULAR

## 2011-08-06 ENCOUNTER — Other Ambulatory Visit: Payer: Self-pay | Admitting: *Deleted

## 2011-08-06 MED ORDER — CITALOPRAM HYDROBROMIDE 20 MG PO TABS: 20.0000 mg | ORAL_TABLET | Freq: Every day | ORAL | Status: DC

## 2011-08-06 NOTE — Telephone Encounter (Signed)
Will refill electronically  

## 2011-08-26 DIAGNOSIS — L408 Other psoriasis: Secondary | ICD-10-CM | POA: Diagnosis not present

## 2011-08-26 DIAGNOSIS — Z85828 Personal history of other malignant neoplasm of skin: Secondary | ICD-10-CM | POA: Diagnosis not present

## 2011-08-26 DIAGNOSIS — L821 Other seborrheic keratosis: Secondary | ICD-10-CM | POA: Diagnosis not present

## 2011-09-03 ENCOUNTER — Ambulatory Visit (INDEPENDENT_AMBULATORY_CARE_PROVIDER_SITE_OTHER): Payer: Medicare Other

## 2011-09-03 DIAGNOSIS — E538 Deficiency of other specified B group vitamins: Secondary | ICD-10-CM | POA: Diagnosis not present

## 2011-09-03 MED ORDER — CYANOCOBALAMIN 1000 MCG/ML IJ SOLN
1000.0000 ug | Freq: Once | INTRAMUSCULAR | Status: AC
  Administered 2011-09-03: 1000 ug via INTRAMUSCULAR

## 2011-10-06 ENCOUNTER — Ambulatory Visit (INDEPENDENT_AMBULATORY_CARE_PROVIDER_SITE_OTHER): Payer: Medicare Other | Admitting: *Deleted

## 2011-10-06 DIAGNOSIS — E538 Deficiency of other specified B group vitamins: Secondary | ICD-10-CM

## 2011-10-06 DIAGNOSIS — D518 Other vitamin B12 deficiency anemias: Secondary | ICD-10-CM

## 2011-10-06 MED ORDER — CYANOCOBALAMIN 1000 MCG/ML IJ SOLN: 1000.0000 ug | Freq: Once | INTRAMUSCULAR | Status: DC

## 2011-10-28 ENCOUNTER — Telehealth: Payer: Self-pay

## 2011-10-28 NOTE — Telephone Encounter (Signed)
Patient notified by telephone that letter is up front and ready for pickup. 

## 2011-10-28 NOTE — Telephone Encounter (Signed)
Letter in IN box 

## 2011-10-28 NOTE — Telephone Encounter (Signed)
Pt request letter excusing pt  for jury duty. Needs to include back and leg pain with neck pain causing h/a. Pt states also after h/a sometimes has memory issues. Call when ready for pickup.

## 2011-11-17 ENCOUNTER — Ambulatory Visit (INDEPENDENT_AMBULATORY_CARE_PROVIDER_SITE_OTHER): Payer: Medicare Other | Admitting: *Deleted

## 2011-11-17 DIAGNOSIS — E538 Deficiency of other specified B group vitamins: Secondary | ICD-10-CM

## 2011-11-17 MED ORDER — CYANOCOBALAMIN 1000 MCG/ML IJ SOLN
1000.0000 ug | Freq: Once | INTRAMUSCULAR | Status: AC
  Administered 2011-11-17: 1000 ug via INTRAMUSCULAR

## 2011-12-23 ENCOUNTER — Ambulatory Visit (INDEPENDENT_AMBULATORY_CARE_PROVIDER_SITE_OTHER): Payer: Medicare Other | Admitting: *Deleted

## 2011-12-23 DIAGNOSIS — E538 Deficiency of other specified B group vitamins: Secondary | ICD-10-CM | POA: Diagnosis not present

## 2011-12-23 MED ORDER — CYANOCOBALAMIN 1000 MCG/ML IJ SOLN
1000.0000 ug | Freq: Once | INTRAMUSCULAR | Status: AC
  Administered 2011-12-23: 1000 ug via INTRAMUSCULAR

## 2011-12-24 DIAGNOSIS — D3130 Benign neoplasm of unspecified choroid: Secondary | ICD-10-CM | POA: Diagnosis not present

## 2011-12-24 DIAGNOSIS — H524 Presbyopia: Secondary | ICD-10-CM | POA: Diagnosis not present

## 2011-12-24 DIAGNOSIS — B301 Conjunctivitis due to adenovirus: Secondary | ICD-10-CM | POA: Diagnosis not present

## 2011-12-24 DIAGNOSIS — E119 Type 2 diabetes mellitus without complications: Secondary | ICD-10-CM | POA: Diagnosis not present

## 2012-01-20 ENCOUNTER — Other Ambulatory Visit: Payer: Self-pay | Admitting: Family Medicine

## 2012-01-20 NOTE — Telephone Encounter (Signed)
Ok to refill? last OV over a year ago

## 2012-01-20 NOTE — Telephone Encounter (Signed)
Please schedule f/u appt  Then refil med until that visit, thanks

## 2012-01-21 ENCOUNTER — Other Ambulatory Visit: Payer: Self-pay

## 2012-01-21 MED ORDER — TRIAMTERENE-HCTZ 37.5-25 MG PO CAPS: 1.0000 | ORAL_CAPSULE | Freq: Every day | ORAL | Status: DC

## 2012-01-21 NOTE — Telephone Encounter (Signed)
Called patient scheduled appointment for 01/25/12 for medication refill. Sent in Rx for Triamterene -Hydrochlorothiazide 37.5-25 mg  0 R to [facility].

## 2012-01-22 ENCOUNTER — Encounter: Payer: Self-pay | Admitting: Family Medicine

## 2012-01-25 ENCOUNTER — Encounter: Payer: Self-pay | Admitting: Family Medicine

## 2012-01-25 ENCOUNTER — Ambulatory Visit (INDEPENDENT_AMBULATORY_CARE_PROVIDER_SITE_OTHER): Payer: Medicare Other | Admitting: Family Medicine

## 2012-01-25 VITALS — BP 120/62 | HR 69 | Temp 99.0°F | Ht 64.75 in | Wt 227.8 lb

## 2012-01-25 DIAGNOSIS — D518 Other vitamin B12 deficiency anemias: Secondary | ICD-10-CM | POA: Diagnosis not present

## 2012-01-25 DIAGNOSIS — F329 Major depressive disorder, single episode, unspecified: Secondary | ICD-10-CM

## 2012-01-25 DIAGNOSIS — E7800 Pure hypercholesterolemia, unspecified: Secondary | ICD-10-CM | POA: Diagnosis not present

## 2012-01-25 DIAGNOSIS — N259 Disorder resulting from impaired renal tubular function, unspecified: Secondary | ICD-10-CM

## 2012-01-25 DIAGNOSIS — I10 Essential (primary) hypertension: Secondary | ICD-10-CM | POA: Diagnosis not present

## 2012-01-25 DIAGNOSIS — R7309 Other abnormal glucose: Secondary | ICD-10-CM | POA: Diagnosis not present

## 2012-01-25 LAB — COMPREHENSIVE METABOLIC PANEL
AST: 18 U/L (ref 0–37)
BUN: 18 mg/dL (ref 6–23)
Calcium: 9.7 mg/dL (ref 8.4–10.5)
Chloride: 103 mEq/L (ref 96–112)
Creatinine, Ser: 1 mg/dL (ref 0.4–1.2)
GFR: 58.32 mL/min — ABNORMAL LOW (ref >60.00)
Glucose, Bld: 101 mg/dL — ABNORMAL HIGH (ref 70–99)

## 2012-01-25 LAB — TSH: TSH: 1.72 u[IU]/mL (ref 0.35–5.50)

## 2012-01-25 LAB — CBC WITH DIFFERENTIAL/PLATELET
Basophils Relative: 0.5 % (ref 0.0–3.0)
Eosinophils Absolute: 0.3 10*3/uL (ref 0.0–0.7)
Eosinophils Relative: 3.4 % (ref 0.0–5.0)
Hemoglobin: 11.3 g/dL — ABNORMAL LOW (ref 12.0–15.0)
Lymphocytes Relative: 16.4 % (ref 12.0–46.0)
MCHC: 32.5 g/dL (ref 30.0–36.0)
MCV: 83.4 fl (ref 78.0–100.0)
Neutro Abs: 7.6 10*3/uL (ref 1.4–7.7)
RBC: 4.17 Mil/uL (ref 3.87–5.11)

## 2012-01-25 LAB — HEMOGLOBIN A1C: Hgb A1c MFr Bld: 6.6 % — ABNORMAL HIGH (ref 4.6–6.5)

## 2012-01-25 LAB — LIPID PANEL
Cholesterol: 167 mg/dL (ref 0–200)
VLDL: 55.8 mg/dL — ABNORMAL HIGH (ref 0.0–40.0)

## 2012-01-25 MED ORDER — CITALOPRAM HYDROBROMIDE 40 MG PO TABS: 40.0000 mg | ORAL_TABLET | Freq: Every day | ORAL | Status: DC

## 2012-01-25 MED ORDER — AMLODIPINE BESY-BENAZEPRIL HCL 10-20 MG PO CAPS: 1.0000 | ORAL_CAPSULE | Freq: Every day | ORAL | Status: DC

## 2012-01-25 MED ORDER — CYANOCOBALAMIN 1000 MCG/ML IJ SOLN
1000.0000 ug | Freq: Once | INTRAMUSCULAR | Status: AC
  Administered 2012-01-25: 1000 ug via INTRAMUSCULAR

## 2012-01-25 MED ORDER — CLOTRIMAZOLE-BETAMETHASONE 1-0.05 % EX CREA: 1.0000 "application " | TOPICAL_CREAM | Freq: Every day | CUTANEOUS | Status: DC | PRN

## 2012-01-25 NOTE — Progress Notes (Signed)
  Subjective:    Patient ID: Ann Cummings, female    DOB: 1941/01/30, 71 y.o.   MRN: [id number]  HPI Here for f/u of chronic med problems  Nothing new going on     bp is stable today  No cp or palpitations or headaches or edema  No side effects to medicines  BP Readings from Last 3 Encounters:  01/25/12 120/62  12/22/10 130/60  07/08/10 128/60    She admits to not drinking enough fluids   Hyperglycemia  Lab Results  Component Value Date   HGBA1C 6.4 12/22/2010     Cholesterol- crestor Lab Results  Component Value Date   CHOL 171 06/23/2010   HDL 43.50 06/23/2010   LDLDIRECT 86.9 06/23/2010   TRIG 329.0* 06/23/2010   CHOLHDL 4 06/23/2010     B12 def - takes oral replacement  Also supposed to get shots every mo-is bad about that   Gained 9 lb with bmi 38  Habits have not been good Not as active as she should be   A lot of loss of late -- including her brother (recent) and cousins  Is doing as well as can be expected  Family , however suspects her depression and anx is worse More reserved, unsocial, often tearful, lack of motivation and self care Interested in inc her celexa     Review of Systems Review of Systems  Constitutional: Negative for fever, appetite change, fatigue and unexpected weight change.  Eyes: Negative for pain and visual disturbance.  Respiratory: Negative for cough and shortness of breath.   Cardiovascular: Negative for cp or palpitations    Gastrointestinal: Negative for nausea, diarrhea and constipation.  Genitourinary: Negative for urgency and frequency.  Skin: Negative for pallor or rash   Neurological: Negative for weakness, light-headedness, numbness and headaches.  Hematological: Negative for adenopathy. Does not bruise/bleed easily.  Psychiatric/Behavioral: pos for depression and anxiety       Objective:   Physical Exam  Constitutional: She appears well-developed and well-nourished. No distress.       obese and well appearing   HENT:    Head: Normocephalic and atraumatic.  Mouth/Throat: Oropharynx is clear and moist.  Eyes: Conjunctivae and EOM are normal. Pupils are equal, round, and reactive to light. Right eye exhibits no discharge. Left eye exhibits no discharge. No scleral icterus.  Neck: Normal range of motion. Neck supple. No JVD present. Carotid bruit is not present. No thyromegaly present.  Cardiovascular: Normal rate, regular rhythm, normal heart sounds and intact distal pulses.  Exam reveals no gallop.   Pulmonary/Chest: Effort normal and breath sounds normal. No respiratory distress. She has no wheezes.  Abdominal: Soft. Bowel sounds are normal. She exhibits no distension, no abdominal bruit and no mass. There is no tenderness.  Musculoskeletal: She exhibits no edema and no tenderness.  Lymphadenopathy:    She has no cervical adenopathy.  Neurological: She is alert. No cranial nerve deficit. She exhibits normal muscle tone. Coordination normal.  Skin: Skin is warm and dry. No rash noted. No erythema. No pallor.  Psychiatric: Her speech is normal and behavior is normal. Thought content normal. Her mood appears anxious. Cognition and memory are normal. She exhibits a depressed mood. She expresses no homicidal and no suicidal ideation.       Almost tearful at times Answers questions with one word answers          Assessment & Plan:

## 2012-01-25 NOTE — Assessment & Plan Note (Signed)
Less motivation lately More sad Lot of loss Inc celexa to 40  I enc counseling F/u 3 mo

## 2012-01-25 NOTE — Assessment & Plan Note (Signed)
Labs today Counseled on drinking enough fluids - stressed importance

## 2012-01-25 NOTE — Assessment & Plan Note (Signed)
Shot today- is not very compliant Also oral supp Level today as well

## 2012-01-25 NOTE — Assessment & Plan Note (Signed)
a1c today Poor diet/exercise Wt gain Exp worse F/u 3 mo  Counseled in lifestyle change

## 2012-01-25 NOTE — Assessment & Plan Note (Signed)
bp is stable today  No cp or palpitations or headaches or edema  No side effects to medicines  BP Readings from Last 3 Encounters:  01/25/12 120/62  12/22/10 130/60  07/08/10 128/60     bp in fair control at this time  No changes needed  Disc lifstyle change with low sodium diet and exercise

## 2012-01-25 NOTE — Patient Instructions (Addendum)
Increase celexa to 40 mg daily for depression  Make a good effort to exercise daily  Also avoid sugar B12 shot today Lab today  Follow up in 3 months

## 2012-01-25 NOTE — Assessment & Plan Note (Signed)
Lipid check today  Rev low sat fat diet On crestor

## 2012-03-09 ENCOUNTER — Other Ambulatory Visit: Payer: Self-pay | Admitting: Family Medicine

## 2012-03-15 ENCOUNTER — Ambulatory Visit (INDEPENDENT_AMBULATORY_CARE_PROVIDER_SITE_OTHER): Payer: Medicare Other | Admitting: *Deleted

## 2012-03-15 ENCOUNTER — Ambulatory Visit: Payer: Medicare Other

## 2012-03-15 DIAGNOSIS — E538 Deficiency of other specified B group vitamins: Secondary | ICD-10-CM

## 2012-03-15 MED ORDER — CYANOCOBALAMIN 1000 MCG/ML IJ SOLN
1000.0000 ug | Freq: Once | INTRAMUSCULAR | Status: AC
  Administered 2012-03-15: 1000 ug via INTRAMUSCULAR

## 2012-04-20 ENCOUNTER — Ambulatory Visit (INDEPENDENT_AMBULATORY_CARE_PROVIDER_SITE_OTHER): Payer: Medicare Other | Admitting: *Deleted

## 2012-04-20 DIAGNOSIS — E538 Deficiency of other specified B group vitamins: Secondary | ICD-10-CM

## 2012-04-20 MED ORDER — CYANOCOBALAMIN 1000 MCG/ML IJ SOLN
1000.0000 ug | Freq: Once | INTRAMUSCULAR | Status: AC
  Administered 2012-04-20: 1000 ug via INTRAMUSCULAR

## 2012-05-27 DIAGNOSIS — L408 Other psoriasis: Secondary | ICD-10-CM | POA: Diagnosis not present

## 2012-05-27 DIAGNOSIS — L259 Unspecified contact dermatitis, unspecified cause: Secondary | ICD-10-CM | POA: Diagnosis not present

## 2012-07-07 DIAGNOSIS — L2089 Other atopic dermatitis: Secondary | ICD-10-CM | POA: Diagnosis not present

## 2012-07-07 DIAGNOSIS — L408 Other psoriasis: Secondary | ICD-10-CM | POA: Diagnosis not present

## 2012-07-07 DIAGNOSIS — L538 Other specified erythematous conditions: Secondary | ICD-10-CM | POA: Diagnosis not present

## 2012-07-07 DIAGNOSIS — D485 Neoplasm of uncertain behavior of skin: Secondary | ICD-10-CM | POA: Diagnosis not present

## 2012-07-13 ENCOUNTER — Other Ambulatory Visit: Payer: Self-pay | Admitting: Family Medicine

## 2012-07-13 NOTE — Telephone Encounter (Signed)
Ok to refill 

## 2012-07-13 NOTE — Telephone Encounter (Signed)
Please refil for 6 mo , thanks  

## 2012-07-13 NOTE — Telephone Encounter (Signed)
done

## 2012-07-14 NOTE — Telephone Encounter (Signed)
Pt called for status of cyclobenzaprine refill; spoke with April at [facility] and when tried to fill for 3 month rx a prior auth came up but when refilled for one month went thru with copay of $4.98.  Pt advised and will pick up one month rx.

## 2012-07-25 ENCOUNTER — Other Ambulatory Visit: Payer: Self-pay | Admitting: Family Medicine

## 2012-08-17 ENCOUNTER — Other Ambulatory Visit: Payer: Self-pay | Admitting: Family Medicine

## 2012-08-23 ENCOUNTER — Telehealth: Payer: Self-pay

## 2012-08-23 NOTE — Telephone Encounter (Signed)
Pt called to schedule Vit B 12 injection; pt last B 12 injection was 04/2012. Pt has been taking oral Vit B 12 1000 mcg daily since started the B 12 injections. Please advise.

## 2012-08-23 NOTE — Telephone Encounter (Signed)
Pt notified of Dr. Royden Purl comments and verbalized understanding, lab appt scheduled for 11/02/12, pt wanted to know if we could check her iron at that lab appt too, please advise

## 2012-08-23 NOTE — Telephone Encounter (Signed)
done

## 2012-08-23 NOTE — Telephone Encounter (Signed)
We can add ferritin and cbc for anemia -thanks

## 2012-08-23 NOTE — Telephone Encounter (Signed)
We are on the verge of running out in a shortage and her last level was great - so continue the oral B12 and stop the shots for now  Please schedule lab for B 12 level in 2-3 mo and we will see how it is thanks

## 2012-08-24 DIAGNOSIS — D1739 Benign lipomatous neoplasm of skin and subcutaneous tissue of other sites: Secondary | ICD-10-CM | POA: Diagnosis not present

## 2012-08-24 DIAGNOSIS — L259 Unspecified contact dermatitis, unspecified cause: Secondary | ICD-10-CM | POA: Diagnosis not present

## 2012-09-06 DIAGNOSIS — D3130 Benign neoplasm of unspecified choroid: Secondary | ICD-10-CM | POA: Diagnosis not present

## 2012-09-20 ENCOUNTER — Other Ambulatory Visit: Payer: Self-pay | Admitting: Family Medicine

## 2012-09-21 ENCOUNTER — Telehealth: Payer: Self-pay

## 2012-09-21 MED ORDER — CYANOCOBALAMIN 1000 MCG/ML IJ SOLN: 1000.0000 ug | Freq: Once | INTRAMUSCULAR | Status: DC

## 2012-09-21 NOTE — Telephone Encounter (Signed)
Px sent to [facility] electronically

## 2012-09-21 NOTE — Telephone Encounter (Signed)
Pt left note requesting refill diabetic test strips  And Vit B 12 injection sent to [facility]. Left v/m for pt to call back to find out type of diabetic test strips pt uses, how often test and pt needs to schedule 3 month f/u appt from 01-25-12 visit.

## 2012-09-21 NOTE — Telephone Encounter (Signed)
Pt called back and scheduled appt 09/27/12 with Dr Milinda Antis (pt has enough test strips until seen). Pt also said would be agreeable to get Vit B 12 injection at [facility] since they do have  B 12 injectables available.  Pt request call back when B 12 order sent to [facility] and pt will call [facility] to get injection.

## 2012-09-22 NOTE — Telephone Encounter (Signed)
Pt notified Rx sent to [facility]

## 2012-09-27 ENCOUNTER — Encounter: Payer: Self-pay | Admitting: Family Medicine

## 2012-09-27 ENCOUNTER — Ambulatory Visit (INDEPENDENT_AMBULATORY_CARE_PROVIDER_SITE_OTHER): Payer: Medicare Other | Admitting: Family Medicine

## 2012-09-27 VITALS — BP 144/82 | HR 73 | Temp 98.9°F | Ht 64.75 in | Wt 222.8 lb

## 2012-09-27 DIAGNOSIS — D509 Iron deficiency anemia, unspecified: Secondary | ICD-10-CM | POA: Diagnosis not present

## 2012-09-27 DIAGNOSIS — F3289 Other specified depressive episodes: Secondary | ICD-10-CM

## 2012-09-27 DIAGNOSIS — I10 Essential (primary) hypertension: Secondary | ICD-10-CM | POA: Diagnosis not present

## 2012-09-27 DIAGNOSIS — D72829 Elevated white blood cell count, unspecified: Secondary | ICD-10-CM

## 2012-09-27 DIAGNOSIS — N259 Disorder resulting from impaired renal tubular function, unspecified: Secondary | ICD-10-CM | POA: Diagnosis not present

## 2012-09-27 DIAGNOSIS — F329 Major depressive disorder, single episode, unspecified: Secondary | ICD-10-CM | POA: Diagnosis not present

## 2012-09-27 DIAGNOSIS — R300 Dysuria: Secondary | ICD-10-CM

## 2012-09-27 DIAGNOSIS — R7309 Other abnormal glucose: Secondary | ICD-10-CM | POA: Diagnosis not present

## 2012-09-27 DIAGNOSIS — D518 Other vitamin B12 deficiency anemias: Secondary | ICD-10-CM

## 2012-09-27 LAB — CBC WITH DIFFERENTIAL/PLATELET
Basophils Absolute: 0.1 10*3/uL (ref 0.0–0.1)
Basophils Relative: 0.6 % (ref 0.0–3.0)
Eosinophils Absolute: 0.4 10*3/uL (ref 0.0–0.7)
Lymphocytes Relative: 19.5 % (ref 12.0–46.0)
MCHC: 34 g/dL (ref 30.0–36.0)
MCV: 81.2 fl (ref 78.0–100.0)
Monocytes Absolute: 0.8 10*3/uL (ref 0.1–1.0)
Neutrophils Relative %: 70.5 % (ref 43.0–77.0)
Platelets: 339 10*3/uL (ref 150.0–400.0)
RBC: 4.21 Mil/uL (ref 3.87–5.11)
RDW: 15.5 % — ABNORMAL HIGH (ref 11.5–14.6)

## 2012-09-27 LAB — HEMOGLOBIN A1C: Hgb A1c MFr Bld: 7 % — ABNORMAL HIGH (ref 4.6–6.5)

## 2012-09-27 LAB — VITAMIN B12: Vitamin B-12: 773 pg/mL (ref 211–911)

## 2012-09-27 LAB — COMPREHENSIVE METABOLIC PANEL
AST: 24 U/L (ref 0–37)
Albumin: 3.8 g/dL (ref 3.5–5.2)
Alkaline Phosphatase: 49 U/L (ref 39–117)
BUN: 23 mg/dL (ref 6–23)
Calcium: 9.5 mg/dL (ref 8.4–10.5)
Chloride: 102 mEq/L (ref 96–112)
Potassium: 3.8 mEq/L (ref 3.5–5.1)
Sodium: 138 mEq/L (ref 135–145)
Total Protein: 7 g/dL (ref 6.0–8.3)

## 2012-09-27 NOTE — Assessment & Plan Note (Signed)
Level today Injection no longer avail Will taylor oral suppl dose accordingly Pt is feeling tired

## 2012-09-27 NOTE — Assessment & Plan Note (Signed)
Lab today  Avoids nsaids and makes effort to drink more water

## 2012-09-27 NOTE — Patient Instructions (Addendum)
No change in medicines  Make appt with Dr Patsy Lager when you check out for shoulder pain  Will update you with B12 level re: what to do next  Think about eating better and adding some exercise  Labs today

## 2012-09-27 NOTE — Assessment & Plan Note (Signed)
Improved with inc in celexa  Has good support  Declines counseling  Enc exercise and social interaction

## 2012-09-27 NOTE — Assessment & Plan Note (Signed)
A1c today Disc need for wt loss and low glycemic diet

## 2012-09-27 NOTE — Assessment & Plan Note (Signed)
bp in fair control at this time  No changes needed  Disc lifstyle change with low sodium diet and exercise   Labs today 

## 2012-09-27 NOTE — Assessment & Plan Note (Signed)
Cbc today Pt takes iron otc - ferrous sulfate
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
## 2012-09-28 ENCOUNTER — Ambulatory Visit (INDEPENDENT_AMBULATORY_CARE_PROVIDER_SITE_OTHER)
Admission: RE | Admit: 2012-09-28 | Discharge: 2012-09-28 | Disposition: A | Discharge status: Home / Self Care | Payer: Medicare Other | Source: Ambulatory Visit | Attending: Family Medicine | Admitting: Family Medicine

## 2012-09-28 ENCOUNTER — Ambulatory Visit (INDEPENDENT_AMBULATORY_CARE_PROVIDER_SITE_OTHER): Payer: Medicare Other | Admitting: Family Medicine

## 2012-09-28 ENCOUNTER — Encounter: Payer: Self-pay | Admitting: Family Medicine

## 2012-09-28 VITALS — BP 138/68 | HR 68 | Temp 98.9°F | Wt 225.8 lb

## 2012-09-28 DIAGNOSIS — M7502 Adhesive capsulitis of left shoulder: Secondary | ICD-10-CM

## 2012-09-28 DIAGNOSIS — M549 Dorsalgia, unspecified: Secondary | ICD-10-CM

## 2012-09-28 DIAGNOSIS — R300 Dysuria: Secondary | ICD-10-CM

## 2012-09-28 DIAGNOSIS — M47817 Spondylosis without myelopathy or radiculopathy, lumbosacral region: Secondary | ICD-10-CM | POA: Diagnosis not present

## 2012-09-28 DIAGNOSIS — M7500 Adhesive capsulitis of unspecified shoulder: Secondary | ICD-10-CM

## 2012-09-28 DIAGNOSIS — M19019 Primary osteoarthritis, unspecified shoulder: Secondary | ICD-10-CM | POA: Diagnosis not present

## 2012-09-28 DIAGNOSIS — D72829 Elevated white blood cell count, unspecified: Secondary | ICD-10-CM | POA: Diagnosis not present

## 2012-09-28 NOTE — Patient Instructions (Addendum)
REFERRAL: GO THE THE FRONT ROOM AT THE ENTRANCE OF OUR CLINIC, NEAR CHECK IN. ASK FOR MARION. SHE WILL HELP YOU SET UP YOUR REFERRAL. DATE: TIME:  
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
## 2012-09-28 NOTE — Addendum Note (Signed)
Addended by: Alvina Chou on: 09/28/2012 09:49 AM   Modules accepted: Orders

## 2012-09-30 LAB — URINE CULTURE: Organism ID, Bacteria: NO GROWTH

## 2012-10-05 ENCOUNTER — Ambulatory Visit (INDEPENDENT_AMBULATORY_CARE_PROVIDER_SITE_OTHER): Payer: Medicare Other | Admitting: Family Medicine

## 2012-10-05 ENCOUNTER — Encounter: Payer: Self-pay | Admitting: Family Medicine

## 2012-10-05 ENCOUNTER — Other Ambulatory Visit: Payer: Self-pay | Admitting: Family Medicine

## 2012-10-05 VITALS — BP 136/76 | HR 70 | Temp 99.2°F | Ht 64.75 in | Wt 224.5 lb

## 2012-10-05 DIAGNOSIS — I10 Essential (primary) hypertension: Secondary | ICD-10-CM

## 2012-10-05 DIAGNOSIS — R7309 Other abnormal glucose: Secondary | ICD-10-CM

## 2012-10-05 DIAGNOSIS — N259 Disorder resulting from impaired renal tubular function, unspecified: Secondary | ICD-10-CM

## 2012-10-05 DIAGNOSIS — D72829 Elevated white blood cell count, unspecified: Secondary | ICD-10-CM

## 2012-10-05 DIAGNOSIS — D518 Other vitamin B12 deficiency anemias: Secondary | ICD-10-CM | POA: Diagnosis not present

## 2012-10-05 MED ORDER — GLUCOSE BLOOD VI STRP: ORAL_STRIP | Status: DC

## 2012-10-05 MED ORDER — AMOXICILLIN-POT CLAVULANATE 875-125 MG PO TABS: 1.0000 | ORAL_TABLET | Freq: Two times a day (BID) | ORAL | Status: DC

## 2012-10-05 NOTE — Assessment & Plan Note (Signed)
Suspect due to sinusitis tx with augmentin  Update  Re check 3 mo

## 2012-10-05 NOTE — Patient Instructions (Addendum)
Stop dyazide- it may be too hard on your kidneys  Drink at least 8 glasses of water per day  Eat much less sugar/ start exericse 5 days per week and work hard on weight loss  Take augmentin for sinus infection Go to [employer] and talk to [place] about their diabetic education program  Cancel next lab appt  Re schedule it for 3 months and then follow up a week later
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
## 2012-10-05 NOTE — Assessment & Plan Note (Signed)
bp in fair control at this time  No changes needed  Disc lifstyle change with low sodium diet and exercise   Will have to stop dyazide due to inc cr She will inc water intake F/u 3 mo Replace with lasix if necessary

## 2012-10-05 NOTE — Assessment & Plan Note (Signed)
Cr is up  Inc water Stop dyazide  Re check at f/u appt  Also work on DM control

## 2012-10-05 NOTE — Assessment & Plan Note (Signed)
Level in 700s- good Will take 1000 mcg daily

## 2012-10-05 NOTE — Assessment & Plan Note (Signed)
a1c up -not diabetic  [place] have [place] to disc their DM ed program  Disc lifestyle change in detail  Lab and f/u 3 m

## 2012-10-06 DIAGNOSIS — M25519 Pain in unspecified shoulder: Secondary | ICD-10-CM | POA: Diagnosis not present

## 2012-10-06 DIAGNOSIS — M25619 Stiffness of unspecified shoulder, not elsewhere classified: Secondary | ICD-10-CM | POA: Diagnosis not present

## 2012-10-06 DIAGNOSIS — M6281 Muscle weakness (generalized): Secondary | ICD-10-CM | POA: Diagnosis not present

## 2012-10-11 DIAGNOSIS — M25519 Pain in unspecified shoulder: Secondary | ICD-10-CM | POA: Diagnosis not present

## 2012-10-11 DIAGNOSIS — M6281 Muscle weakness (generalized): Secondary | ICD-10-CM | POA: Diagnosis not present

## 2012-10-11 DIAGNOSIS — M25619 Stiffness of unspecified shoulder, not elsewhere classified: Secondary | ICD-10-CM | POA: Diagnosis not present

## 2012-10-11 DIAGNOSIS — M545 Low back pain: Secondary | ICD-10-CM | POA: Diagnosis not present

## 2012-10-13 DIAGNOSIS — M25519 Pain in unspecified shoulder: Secondary | ICD-10-CM | POA: Diagnosis not present

## 2012-10-13 DIAGNOSIS — M545 Low back pain: Secondary | ICD-10-CM | POA: Diagnosis not present

## 2012-10-13 DIAGNOSIS — M25619 Stiffness of unspecified shoulder, not elsewhere classified: Secondary | ICD-10-CM | POA: Diagnosis not present

## 2012-10-18 DIAGNOSIS — M25519 Pain in unspecified shoulder: Secondary | ICD-10-CM | POA: Diagnosis not present

## 2012-10-18 DIAGNOSIS — M545 Low back pain: Secondary | ICD-10-CM | POA: Diagnosis not present

## 2012-10-18 DIAGNOSIS — M25619 Stiffness of unspecified shoulder, not elsewhere classified: Secondary | ICD-10-CM | POA: Diagnosis not present

## 2012-10-20 DIAGNOSIS — M25619 Stiffness of unspecified shoulder, not elsewhere classified: Secondary | ICD-10-CM | POA: Diagnosis not present

## 2012-10-20 DIAGNOSIS — M25519 Pain in unspecified shoulder: Secondary | ICD-10-CM | POA: Diagnosis not present

## 2012-10-20 DIAGNOSIS — M545 Low back pain: Secondary | ICD-10-CM | POA: Diagnosis not present

## 2012-10-26 DIAGNOSIS — M25619 Stiffness of unspecified shoulder, not elsewhere classified: Secondary | ICD-10-CM | POA: Diagnosis not present

## 2012-10-26 DIAGNOSIS — M545 Low back pain: Secondary | ICD-10-CM | POA: Diagnosis not present

## 2012-10-26 DIAGNOSIS — M25519 Pain in unspecified shoulder: Secondary | ICD-10-CM | POA: Diagnosis not present

## 2012-10-26 DIAGNOSIS — E119 Type 2 diabetes mellitus without complications: Secondary | ICD-10-CM | POA: Diagnosis not present

## 2012-11-02 ENCOUNTER — Other Ambulatory Visit: Payer: Medicare Other

## 2012-11-02 DIAGNOSIS — E119 Type 2 diabetes mellitus without complications: Secondary | ICD-10-CM | POA: Diagnosis not present

## 2012-11-03 DIAGNOSIS — M545 Low back pain: Secondary | ICD-10-CM | POA: Diagnosis not present

## 2012-11-03 DIAGNOSIS — M25519 Pain in unspecified shoulder: Secondary | ICD-10-CM | POA: Diagnosis not present

## 2012-11-03 DIAGNOSIS — M25619 Stiffness of unspecified shoulder, not elsewhere classified: Secondary | ICD-10-CM | POA: Diagnosis not present

## 2012-11-09 ENCOUNTER — Encounter: Payer: Self-pay | Admitting: Family Medicine

## 2012-11-09 ENCOUNTER — Ambulatory Visit (INDEPENDENT_AMBULATORY_CARE_PROVIDER_SITE_OTHER): Payer: Medicare Other | Admitting: Family Medicine

## 2012-11-09 VITALS — BP 120/64 | HR 59 | Temp 99.2°F | Ht 64.75 in | Wt 213.2 lb

## 2012-11-09 DIAGNOSIS — M7500 Adhesive capsulitis of unspecified shoulder: Secondary | ICD-10-CM

## 2012-11-09 DIAGNOSIS — E119 Type 2 diabetes mellitus without complications: Secondary | ICD-10-CM | POA: Diagnosis not present

## 2012-11-09 DIAGNOSIS — M7502 Adhesive capsulitis of left shoulder: Secondary | ICD-10-CM
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
## 2012-11-23 ENCOUNTER — Other Ambulatory Visit: Payer: Self-pay | Admitting: Family Medicine

## 2012-11-24 MED ORDER — ONETOUCH DELICA LANCETS FINE MISC: 1.0000 | Status: AC

## 2012-12-29 ENCOUNTER — Telehealth: Payer: Self-pay | Admitting: Family Medicine

## 2012-12-29 DIAGNOSIS — D509 Iron deficiency anemia, unspecified: Secondary | ICD-10-CM

## 2012-12-29 DIAGNOSIS — D518 Other vitamin B12 deficiency anemias: Secondary | ICD-10-CM

## 2012-12-29 NOTE — Telephone Encounter (Signed)
labs

## 2012-12-30 ENCOUNTER — Other Ambulatory Visit (INDEPENDENT_AMBULATORY_CARE_PROVIDER_SITE_OTHER): Payer: Medicare Other

## 2012-12-30 DIAGNOSIS — R7309 Other abnormal glucose: Secondary | ICD-10-CM

## 2012-12-30 DIAGNOSIS — D518 Other vitamin B12 deficiency anemias: Secondary | ICD-10-CM | POA: Diagnosis not present

## 2012-12-30 DIAGNOSIS — D509 Iron deficiency anemia, unspecified: Secondary | ICD-10-CM

## 2012-12-30 DIAGNOSIS — D72829 Elevated white blood cell count, unspecified: Secondary | ICD-10-CM | POA: Diagnosis not present

## 2012-12-30 LAB — CBC WITH DIFFERENTIAL/PLATELET
Basophils Relative: 0.5 % (ref 0.0–3.0)
Eosinophils Relative: 5.3 % — ABNORMAL HIGH (ref 0.0–5.0)
Hemoglobin: 11.5 g/dL — ABNORMAL LOW (ref 12.0–15.0)
Lymphocytes Relative: 22 % (ref 12.0–46.0)
MCHC: 33.1 g/dL (ref 30.0–36.0)
Monocytes Relative: 5.3 % (ref 3.0–12.0)
Neutro Abs: 6.5 10*3/uL (ref 1.4–7.7)
RBC: 4.13 Mil/uL (ref 3.87–5.11)
WBC: 9.7 10*3/uL (ref 4.5–10.5)

## 2013-01-06 ENCOUNTER — Ambulatory Visit (INDEPENDENT_AMBULATORY_CARE_PROVIDER_SITE_OTHER): Payer: Medicare Other | Admitting: Family Medicine

## 2013-01-06 ENCOUNTER — Encounter: Payer: Self-pay | Admitting: Family Medicine

## 2013-01-06 VITALS — BP 140/70 | HR 61 | Temp 98.9°F | Ht 64.75 in | Wt 217.0 lb

## 2013-01-06 DIAGNOSIS — R600 Localized edema: Secondary | ICD-10-CM | POA: Insufficient documentation

## 2013-01-06 DIAGNOSIS — D72829 Elevated white blood cell count, unspecified: Secondary | ICD-10-CM

## 2013-01-06 DIAGNOSIS — R609 Edema, unspecified: Secondary | ICD-10-CM

## 2013-01-06 DIAGNOSIS — I10 Essential (primary) hypertension: Secondary | ICD-10-CM | POA: Diagnosis not present

## 2013-01-06 DIAGNOSIS — N259 Disorder resulting from impaired renal tubular function, unspecified: Secondary | ICD-10-CM

## 2013-01-06 DIAGNOSIS — D518 Other vitamin B12 deficiency anemias: Secondary | ICD-10-CM

## 2013-01-06 DIAGNOSIS — D509 Iron deficiency anemia, unspecified: Secondary | ICD-10-CM

## 2013-01-06 DIAGNOSIS — E119 Type 2 diabetes mellitus without complications: Secondary | ICD-10-CM | POA: Diagnosis not present

## 2013-01-06 MED ORDER — FUROSEMIDE 20 MG PO TABS: 20.0000 mg | ORAL_TABLET | Freq: Every day | ORAL | Status: DC

## 2013-01-06 NOTE — Assessment & Plan Note (Signed)
Trial of lasix which should be easier on kidneys Lab and f/u 1 mo

## 2013-01-06 NOTE — Assessment & Plan Note (Signed)
Will try lasix for HTN and edema in hopes it will not effect cr Disc imp of water intake  F/u in 1 mo for lab and visit
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
## 2013-01-06 NOTE — Patient Instructions (Addendum)
Don't forget to get your annual eye exam  Start lasix 20 mg 1 pill each am for blood pressure and swelling  Try to get iron in your diet Diabetes control is better Get back to exercising  Schedule fasting lab and follow up in about a month

## 2013-01-06 NOTE — Assessment & Plan Note (Signed)
B 12 level ok with oral supplementation in 600s

## 2013-01-06 NOTE — Assessment & Plan Note (Signed)
Lab Results  Component Value Date   HGBA1C 6.5 12/30/2012   down from 7 ! Enc to keep up work on diet-get back to exercise and work on wt loss  She will schedule her own annual eye exam

## 2013-01-06 NOTE — Assessment & Plan Note (Signed)
bp is up a bit  Pt has more edema off diuretic as well  In light of renal insuff-will try lasix 20 mg Disc imp of water intake and ref sodium in diet  F/u with lab 69mo

## 2013-01-06 NOTE — Assessment & Plan Note (Signed)
This continues with low ferritin (HB is 11.5) Stable Pt does not tol oral iron of any kind Will eat diet rich in iron  She is not ready for heme consult yet

## 2013-01-27 ENCOUNTER — Other Ambulatory Visit: Payer: Self-pay | Admitting: Family Medicine

## 2013-01-27 NOTE — Telephone Encounter (Signed)
Please refill for a year thanks 

## 2013-01-27 NOTE — Telephone Encounter (Signed)
Electronic refill request please advise  

## 2013-01-27 NOTE — Telephone Encounter (Signed)
done

## 2013-02-01 ENCOUNTER — Telehealth: Payer: Self-pay

## 2013-02-01 NOTE — Telephone Encounter (Signed)
Done and in IN box 

## 2013-02-01 NOTE — Telephone Encounter (Signed)
Amlodipine-Benazepril requires prior authorization; form on Dr Royden Purl shelf.

## 2013-02-01 NOTE — Telephone Encounter (Signed)
Debra with [employer] left v/m that Amlodipine Benazepril had been approved from 02/01/13 -02/01/14. Spoke with [place] at [place] and rx went thru.Approval letter will follow.

## 2013-02-01 NOTE — Telephone Encounter (Signed)
PA faxed

## 2013-02-06 ENCOUNTER — Other Ambulatory Visit (INDEPENDENT_AMBULATORY_CARE_PROVIDER_SITE_OTHER): Payer: Medicare Other

## 2013-02-06 ENCOUNTER — Other Ambulatory Visit: Payer: Self-pay | Admitting: Family Medicine

## 2013-02-06 DIAGNOSIS — I10 Essential (primary) hypertension: Secondary | ICD-10-CM | POA: Diagnosis not present

## 2013-02-06 DIAGNOSIS — N259 Disorder resulting from impaired renal tubular function, unspecified: Secondary | ICD-10-CM | POA: Diagnosis not present

## 2013-02-06 LAB — COMPREHENSIVE METABOLIC PANEL
ALT: 14 U/L (ref 0–35)
Albumin: 3.7 g/dL (ref 3.5–5.2)
CO2: 31 mEq/L (ref 19–32)
Chloride: 103 mEq/L (ref 96–112)
GFR: 59.52 mL/min — ABNORMAL LOW (ref >60.00)
Glucose, Bld: 113 mg/dL — ABNORMAL HIGH (ref 70–99)
Potassium: 3.7 mEq/L (ref 3.5–5.1)
Sodium: 139 mEq/L (ref 135–145)
Total Protein: 6.8 g/dL (ref 6.0–8.3)

## 2013-02-06 LAB — LIPID PANEL
Cholesterol: 166 mg/dL (ref 0–200)
Total CHOL/HDL Ratio: 3

## 2013-02-10 ENCOUNTER — Encounter: Payer: Self-pay | Admitting: Family Medicine

## 2013-02-10 ENCOUNTER — Ambulatory Visit (INDEPENDENT_AMBULATORY_CARE_PROVIDER_SITE_OTHER): Payer: Medicare Other | Admitting: Family Medicine

## 2013-02-10 VITALS — BP 130/80 | HR 60 | Temp 98.6°F | Ht 64.75 in | Wt 217.8 lb

## 2013-02-10 DIAGNOSIS — R079 Chest pain, unspecified: Secondary | ICD-10-CM | POA: Diagnosis not present

## 2013-02-10 DIAGNOSIS — R609 Edema, unspecified: Secondary | ICD-10-CM

## 2013-02-10 DIAGNOSIS — I10 Essential (primary) hypertension: Secondary | ICD-10-CM | POA: Diagnosis not present

## 2013-02-10 DIAGNOSIS — R600 Localized edema: Secondary | ICD-10-CM

## 2013-02-10 DIAGNOSIS — N259 Disorder resulting from impaired renal tubular function, unspecified: Secondary | ICD-10-CM

## 2013-02-10 DIAGNOSIS — Z23 Encounter for immunization: Secondary | ICD-10-CM | POA: Diagnosis not present

## 2013-02-10 MED ORDER — ALBUTEROL SULFATE HFA 108 (90 BASE) MCG/ACT IN AERS: 2.0000 | INHALATION_SPRAY | RESPIRATORY_TRACT | Status: DC | PRN

## 2013-02-10 NOTE — Assessment & Plan Note (Signed)
occ L sided squeezing chest discomfort -non exertional without other symptoms (occ palpitation not rel) She has cardiac risk factors See EKG-sinus brady- few T wave inv  Last work up years ago Ref to cardiol

## 2013-02-10 NOTE — Patient Instructions (Addendum)
Kidney numbers are improved Continue furosemide  We will refer you to cardiology when you check out to investigate your chest symptoms  If chest symptoms return or worsen please go to the ER Follow up with me in mid Feb with labs prior  Flu vaccine today
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
## 2013-02-12 NOTE — Assessment & Plan Note (Signed)
bp improved on 2nd check with lasix  bp in fair control at this time  No changes needed  Disc lifstyle change with low sodium diet and exercise

## 2013-02-12 NOTE — Assessment & Plan Note (Signed)
Improved with change to lasix from [facility] Lab Results  Component Value Date   CREATININE 1.0 02/06/2013    Will continue to work on a good fluid intake

## 2013-02-12 NOTE — Assessment & Plan Note (Signed)
Baseline - mild with lasix  Disc use of supp hose if bothersome No PND or orthopnea

## 2013-02-15 ENCOUNTER — Encounter: Payer: Self-pay | Admitting: Cardiovascular Disease

## 2013-02-15 ENCOUNTER — Ambulatory Visit (INDEPENDENT_AMBULATORY_CARE_PROVIDER_SITE_OTHER): Payer: Medicare Other | Admitting: Cardiovascular Disease

## 2013-02-15 VITALS — BP 140/60 | HR 52 | Ht 64.0 in | Wt 218.5 lb

## 2013-02-15 DIAGNOSIS — R0789 Other chest pain: Secondary | ICD-10-CM

## 2013-02-15 DIAGNOSIS — I10 Essential (primary) hypertension: Secondary | ICD-10-CM | POA: Diagnosis not present

## 2013-02-15 DIAGNOSIS — R609 Edema, unspecified: Secondary | ICD-10-CM

## 2013-02-15 DIAGNOSIS — R079 Chest pain, unspecified: Secondary | ICD-10-CM | POA: Diagnosis not present

## 2013-02-15 DIAGNOSIS — E7800 Pure hypercholesterolemia, unspecified: Secondary | ICD-10-CM | POA: Diagnosis not present

## 2013-02-15 DIAGNOSIS — R600 Localized edema: Secondary | ICD-10-CM

## 2013-02-15 NOTE — Assessment & Plan Note (Signed)
I have encouraged her to work on diet and weight loss program. This will help her hypertension. Continue with her same medications.

## 2013-02-15 NOTE — Assessment & Plan Note (Signed)
Ann Cummings presents today for further evaluation of some chest discomfort. She has numerous risk factors for coronary artery disease including hypertension, hyperlipidemia, and diabetes mellitus. She also has a family history of coronary artery disease. Her symptoms are somewhat atypical in that they typically occur with rest but they are worrisome in that  she describes a chest pressure that lasts for anywhere from several minutes to an hour.  These episodes are typically related to exertion. She does have some EKG abnormalities that are concerning.  We'll schedule her for a [employer] study at [facility].  I'll see her back for followup visit if the Myoview study is abnormal. Otherwise she'll followup with Dr. Dallas Schimke.  I have advised her to continue working on her diet and exercise and weight loss program.

## 2013-02-15 NOTE — Patient Instructions (Addendum)
[facility] MYOVIEW  Your caregiver has ordered a Stress Test with nuclear imaging. The purpose of this test is to evaluate the blood supply to your heart muscle. This procedure is referred to as a "Non-Invasive Stress Test." This is because other than having an IV started in your vein, nothing is inserted or "invades" your body. Cardiac stress tests are done to find areas of poor blood flow to the heart by determining the extent of coronary artery disease (CAD). Some patients exercise on a treadmill, which naturally increases the blood flow to your heart, while others who are  unable to walk on a treadmill due to physical limitations have a pharmacologic/chemical stress agent called Lexiscan . This medicine will mimic walking on a treadmill by temporarily increasing your coronary blood flow.   Please note: these test may take anywhere between 2-4 hours to complete  PLEASE REPORT TO [facility] MEDICAL MALL ENTRANCE  THE VOLUNTEERS AT THE FIRST DESK WILL DIRECT YOU WHERE TO GO  Date of Procedure:_____MONDAY, OCT 6____________  Arrival Time for Procedure:______7:30 am_____________  Instructions regarding medication:    __X__:  Hold betablocker(s) night before procedure and morning of procedure: ATENOLOL    PLEASE NOTIFY THE OFFICE AT LEAST 24 HOURS IN ADVANCE IF YOU ARE UNABLE TO KEEP YOUR APPOINTMENT.  [phone] AND  PLEASE NOTIFY NUCLEAR MEDICINE AT [facility] AT LEAST 24 HOURS IN ADVANCE IF YOU ARE UNABLE TO KEEP YOUR APPOINTMENT. [phone]  How to prepare for your Myoview test:  1. Do not eat or drink after midnight 2. No caffeine for 24 hours prior to test 3. No smoking 24 hours prior to test. 4. Your medication may be taken with water.  If your doctor stopped a medication because of this test, do not take that medication. 5. Ladies, please do not wear dresses.  Skirts or pants are appropriate. Please wear a short sleeve shirt. 6. No perfume, cologne or lotion. 7. Wear comfortable walking  shoes. No heels!
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
## 2013-02-15 NOTE — Assessment & Plan Note (Signed)
She has no significant edema today.  I suspect she has some venous insufficiency.

## 2013-02-15 NOTE — Assessment & Plan Note (Signed)
Her triglyceride levels and 26. Her cholesterol levels are fairly well-controlled. She is to work on a diet and exercise program.

## 2013-02-20 ENCOUNTER — Ambulatory Visit: Payer: Self-pay

## 2013-02-20 DIAGNOSIS — R079 Chest pain, unspecified: Secondary | ICD-10-CM | POA: Diagnosis not present

## 2013-02-21 ENCOUNTER — Other Ambulatory Visit: Payer: Self-pay

## 2013-02-21 DIAGNOSIS — R079 Chest pain, unspecified: Secondary | ICD-10-CM

## 2013-02-21 DIAGNOSIS — R0789 Other chest pain: Secondary | ICD-10-CM

## 2013-02-22 ENCOUNTER — Telehealth: Payer: Self-pay

## 2013-02-22 NOTE — Telephone Encounter (Signed)
Message copied by Marilynne Halsted on Wed Feb 22, 2013  9:17 AM ------      Message from: Vesta Mixer      Created: Tue Feb 21, 2013 11:08 AM       Normal myoview study       ------

## 2013-02-22 NOTE — Telephone Encounter (Signed)
Spoke w/ pt.  She is aware of results.  

## 2013-03-03 ENCOUNTER — Other Ambulatory Visit: Payer: Self-pay | Admitting: Family Medicine

## 2013-03-07 DIAGNOSIS — H43399 Other vitreous opacities, unspecified eye: Secondary | ICD-10-CM | POA: Diagnosis not present

## 2013-03-07 DIAGNOSIS — H524 Presbyopia: Secondary | ICD-10-CM | POA: Diagnosis not present

## 2013-03-07 DIAGNOSIS — E119 Type 2 diabetes mellitus without complications: Secondary | ICD-10-CM | POA: Diagnosis not present

## 2013-03-07 DIAGNOSIS — H2510 Age-related nuclear cataract, unspecified eye: Secondary | ICD-10-CM | POA: Diagnosis not present

## 2013-03-24 ENCOUNTER — Other Ambulatory Visit: Payer: Self-pay | Admitting: Family Medicine

## 2013-04-17 ENCOUNTER — Encounter: Payer: Self-pay | Admitting: Family Medicine

## 2013-05-04 ENCOUNTER — Other Ambulatory Visit: Payer: Self-pay | Admitting: Family Medicine

## 2013-05-09 ENCOUNTER — Other Ambulatory Visit: Payer: Self-pay | Admitting: Family Medicine

## 2013-05-22 ENCOUNTER — Other Ambulatory Visit: Payer: Self-pay | Admitting: Family Medicine

## 2013-05-26 DIAGNOSIS — L408 Other psoriasis: Secondary | ICD-10-CM | POA: Diagnosis not present

## 2013-05-26 DIAGNOSIS — Z85828 Personal history of other malignant neoplasm of skin: Secondary | ICD-10-CM | POA: Diagnosis not present

## 2013-06-26 ENCOUNTER — Telehealth: Payer: Self-pay | Admitting: Family Medicine

## 2013-06-26 DIAGNOSIS — E119 Type 2 diabetes mellitus without complications: Secondary | ICD-10-CM

## 2013-06-26 DIAGNOSIS — I10 Essential (primary) hypertension: Secondary | ICD-10-CM

## 2013-06-26 NOTE — Telephone Encounter (Signed)
Message copied by Abner Greenspan on Mon Jun 26, 2013  8:15 AM ------      Message from: Ellamae Sia      Created: Wed Jun 21, 2013  5:54 PM      Regarding: Lab orders for Tuesday, 2.10.15       Lab orders for a f/u ------

## 2013-06-27 ENCOUNTER — Other Ambulatory Visit (INDEPENDENT_AMBULATORY_CARE_PROVIDER_SITE_OTHER): Payer: Medicare Other

## 2013-06-27 DIAGNOSIS — I10 Essential (primary) hypertension: Secondary | ICD-10-CM | POA: Diagnosis not present

## 2013-06-27 DIAGNOSIS — E119 Type 2 diabetes mellitus without complications: Secondary | ICD-10-CM | POA: Diagnosis not present

## 2013-06-27 LAB — COMPREHENSIVE METABOLIC PANEL
ALBUMIN: 3.7 g/dL (ref 3.5–5.2)
ALT: 14 U/L (ref 0–35)
AST: 16 U/L (ref 0–37)
Alkaline Phosphatase: 54 U/L (ref 39–117)
BUN: 19 mg/dL (ref 6–23)
CHLORIDE: 105 meq/L (ref 96–112)
CO2: 27 mEq/L (ref 19–32)
Calcium: 9.1 mg/dL (ref 8.4–10.5)
Creatinine, Ser: 0.9 mg/dL (ref 0.4–1.2)
GFR: 62.38 mL/min (ref >60.00)
GLUCOSE: 111 mg/dL — AB (ref 70–99)
POTASSIUM: 4.3 meq/L (ref 3.5–5.1)
Sodium: 141 mEq/L (ref 135–145)
TOTAL PROTEIN: 6.9 g/dL (ref 6.0–8.3)
Total Bilirubin: 0.3 mg/dL (ref 0.3–1.2)

## 2013-06-27 LAB — HEMOGLOBIN A1C: HEMOGLOBIN A1C: 6.6 % — AB (ref 4.6–6.5)

## 2013-07-04 ENCOUNTER — Ambulatory Visit: Payer: Medicare Other | Admitting: Family Medicine

## 2013-07-07 ENCOUNTER — Ambulatory Visit: Payer: Medicare Other | Admitting: Family Medicine

## 2013-07-12 ENCOUNTER — Ambulatory Visit (INDEPENDENT_AMBULATORY_CARE_PROVIDER_SITE_OTHER): Payer: Medicare Other | Admitting: Family Medicine

## 2013-07-12 ENCOUNTER — Encounter: Payer: Self-pay | Admitting: Family Medicine

## 2013-07-12 VITALS — BP 128/68 | HR 52 | Temp 98.6°F | Ht 64.75 in | Wt 222.0 lb

## 2013-07-12 DIAGNOSIS — L309 Dermatitis, unspecified: Secondary | ICD-10-CM | POA: Insufficient documentation

## 2013-07-12 DIAGNOSIS — I10 Essential (primary) hypertension: Secondary | ICD-10-CM

## 2013-07-12 DIAGNOSIS — N259 Disorder resulting from impaired renal tubular function, unspecified: Secondary | ICD-10-CM

## 2013-07-12 DIAGNOSIS — E119 Type 2 diabetes mellitus without complications: Secondary | ICD-10-CM | POA: Diagnosis not present

## 2013-07-12 DIAGNOSIS — L259 Unspecified contact dermatitis, unspecified cause: Secondary | ICD-10-CM

## 2013-07-12 NOTE — Progress Notes (Signed)
Pre visit review using our clinic review tool, if applicable. No additional management support is needed unless otherwise documented below in the visit note. 
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
## 2013-07-12 NOTE — Patient Instructions (Addendum)
Take care of yourself  Watch diet -and work on weight loss  Call your insurance co about the clobetasol , and your dermatologist - re: what is covered  Keep hands very moisturized Labs and blood pressure are stable  Schedule annual exam with labs prior in 6 months If you are interested in a shingles/zoster vaccine - call your insurance to check on coverage,( you should not get it within 1 month of other vaccines) , then call us for a prescription  for it to take to a pharmacy that gives the shot , or make a nurse visit to get it here depending on your coverage

## 2013-07-13 NOTE — Assessment & Plan Note (Signed)
BP: 128/68 mmHg  bp in fair control at this time  No changes needed Disc lifstyle change with low sodium diet and exercise   Labs rev

## 2013-07-13 NOTE — Assessment & Plan Note (Signed)
Pt will call ins re: what steroid cream is more affordable for hand eczema

## 2013-07-13 NOTE — Assessment & Plan Note (Signed)
Lab Results  Component Value Date   HGBA1C 6.6* 06/27/2013   Fairly controlled and stable  Rev lifestyle habits/ diet/ exercise and need for wt loss

## 2013-07-13 NOTE — Assessment & Plan Note (Signed)
Stable/ labs imp with lasix instead of hctz Will continue to monitor Stressed imp of hydration

## 2013-07-14 ENCOUNTER — Telehealth: Payer: Self-pay | Admitting: Family Medicine

## 2013-07-14 NOTE — Telephone Encounter (Signed)
Relevant patient education mailed to patient.  

## 2013-09-13 ENCOUNTER — Other Ambulatory Visit: Payer: Self-pay | Admitting: Family Medicine

## 2013-09-29 ENCOUNTER — Other Ambulatory Visit: Payer: Self-pay | Admitting: Family Medicine

## 2013-10-12 ENCOUNTER — Other Ambulatory Visit: Payer: Self-pay | Admitting: Family Medicine

## 2013-10-17 DIAGNOSIS — H35419 Lattice degeneration of retina, unspecified eye: Secondary | ICD-10-CM | POA: Diagnosis not present

## 2013-11-03 DIAGNOSIS — H40009 Preglaucoma, unspecified, unspecified eye: Secondary | ICD-10-CM | POA: Diagnosis not present

## 2013-11-12 ENCOUNTER — Other Ambulatory Visit: Payer: Self-pay | Admitting: Family Medicine

## 2013-11-13 ENCOUNTER — Other Ambulatory Visit: Payer: Self-pay | Admitting: Family Medicine

## 2013-11-22 ENCOUNTER — Other Ambulatory Visit: Payer: Self-pay | Admitting: Family Medicine

## 2014-01-01 ENCOUNTER — Telehealth: Payer: Self-pay | Admitting: Family Medicine

## 2014-01-01 DIAGNOSIS — E785 Hyperlipidemia, unspecified: Secondary | ICD-10-CM

## 2014-01-01 DIAGNOSIS — N259 Disorder resulting from impaired renal tubular function, unspecified: Secondary | ICD-10-CM

## 2014-01-01 DIAGNOSIS — E119 Type 2 diabetes mellitus without complications: Secondary | ICD-10-CM

## 2014-01-01 DIAGNOSIS — I10 Essential (primary) hypertension: Secondary | ICD-10-CM

## 2014-01-01 NOTE — Telephone Encounter (Signed)
Message copied by Abner Greenspan on Mon Jan 01, 2014  9:03 PM ------      Message from: Ellamae Sia      Created: Wed Dec 27, 2013  3:03 PM      Regarding: Lab orders for Tuesday, 8.18.15       Patient is scheduled for CPX labs, please order future labs, Thanks , Terri       ------

## 2014-01-02 ENCOUNTER — Other Ambulatory Visit (INDEPENDENT_AMBULATORY_CARE_PROVIDER_SITE_OTHER): Payer: Medicare Other

## 2014-01-02 DIAGNOSIS — E785 Hyperlipidemia, unspecified: Secondary | ICD-10-CM

## 2014-01-02 DIAGNOSIS — N259 Disorder resulting from impaired renal tubular function, unspecified: Secondary | ICD-10-CM | POA: Diagnosis not present

## 2014-01-02 DIAGNOSIS — D509 Iron deficiency anemia, unspecified: Secondary | ICD-10-CM

## 2014-01-02 DIAGNOSIS — I10 Essential (primary) hypertension: Secondary | ICD-10-CM

## 2014-01-02 DIAGNOSIS — E119 Type 2 diabetes mellitus without complications: Secondary | ICD-10-CM

## 2014-01-02 DIAGNOSIS — D518 Other vitamin B12 deficiency anemias: Secondary | ICD-10-CM

## 2014-01-02 LAB — LDL CHOLESTEROL, DIRECT: Direct LDL: 91.6 mg/dL

## 2014-01-02 LAB — CBC WITH DIFFERENTIAL/PLATELET
BASOS ABS: 0.1 10*3/uL (ref 0.0–0.1)
Basophils Relative: 0.8 % (ref 0.0–3.0)
EOS ABS: 0.4 10*3/uL (ref 0.0–0.7)
Eosinophils Relative: 4.6 % (ref 0.0–5.0)
HCT: 31.9 % — ABNORMAL LOW (ref 36.0–46.0)
HEMOGLOBIN: 10.3 g/dL — AB (ref 12.0–15.0)
LYMPHS PCT: 21.5 % (ref 12.0–46.0)
Lymphs Abs: 1.9 10*3/uL (ref 0.7–4.0)
MCHC: 32.2 g/dL (ref 30.0–36.0)
MCV: 81.4 fl (ref 78.0–100.0)
MONOS PCT: 5.8 % (ref 3.0–12.0)
Monocytes Absolute: 0.5 10*3/uL (ref 0.1–1.0)
NEUTROS ABS: 5.9 10*3/uL (ref 1.4–7.7)
NEUTROS PCT: 67.3 % (ref 43.0–77.0)
PLATELETS: 314 10*3/uL (ref 150.0–400.0)
RBC: 3.93 Mil/uL (ref 3.87–5.11)
RDW: 15.4 % (ref 11.5–15.5)
WBC: 8.7 10*3/uL (ref 4.0–10.5)

## 2014-01-02 LAB — COMPREHENSIVE METABOLIC PANEL
ALT: 13 U/L (ref 0–35)
AST: 14 U/L (ref 0–37)
Albumin: 3.5 g/dL (ref 3.5–5.2)
Alkaline Phosphatase: 46 U/L (ref 39–117)
BUN: 21 mg/dL (ref 6–23)
CALCIUM: 9 mg/dL (ref 8.4–10.5)
CHLORIDE: 105 meq/L (ref 96–112)
CO2: 28 mEq/L (ref 19–32)
CREATININE: 1 mg/dL (ref 0.4–1.2)
GFR: 55.43 mL/min — ABNORMAL LOW (ref >60.00)
Glucose, Bld: 106 mg/dL — ABNORMAL HIGH (ref 70–99)
Potassium: 4.2 mEq/L (ref 3.5–5.1)
SODIUM: 141 meq/L (ref 135–145)
TOTAL PROTEIN: 6.6 g/dL (ref 6.0–8.3)
Total Bilirubin: 0.4 mg/dL (ref 0.2–1.2)

## 2014-01-02 LAB — LIPID PANEL
CHOL/HDL RATIO: 3
Cholesterol: 158 mg/dL (ref 0–200)
HDL: 46.3 mg/dL (ref >39.00)
NONHDL: 111.7
TRIGLYCERIDES: 222 mg/dL — AB (ref 0.0–149.0)
VLDL: 44.4 mg/dL — ABNORMAL HIGH (ref 0.0–40.0)

## 2014-01-02 LAB — TSH: TSH: 2.06 u[IU]/mL (ref 0.35–4.50)

## 2014-01-02 LAB — HEMOGLOBIN A1C: Hgb A1c MFr Bld: 6.7 % — ABNORMAL HIGH (ref 4.6–6.5)

## 2014-01-09 ENCOUNTER — Ambulatory Visit (INDEPENDENT_AMBULATORY_CARE_PROVIDER_SITE_OTHER): Payer: Medicare Other | Admitting: Family Medicine

## 2014-01-09 ENCOUNTER — Encounter: Payer: Self-pay | Admitting: Family Medicine

## 2014-01-09 ENCOUNTER — Encounter: Payer: Self-pay | Admitting: Internal Medicine

## 2014-01-09 VITALS — BP 140/54 | HR 55 | Temp 99.2°F | Ht 63.75 in | Wt 226.2 lb

## 2014-01-09 DIAGNOSIS — Z23 Encounter for immunization: Secondary | ICD-10-CM | POA: Diagnosis not present

## 2014-01-09 DIAGNOSIS — E785 Hyperlipidemia, unspecified: Secondary | ICD-10-CM | POA: Diagnosis not present

## 2014-01-09 DIAGNOSIS — R109 Unspecified abdominal pain: Secondary | ICD-10-CM

## 2014-01-09 DIAGNOSIS — N1832 Chronic kidney disease, stage 3b: Secondary | ICD-10-CM | POA: Insufficient documentation

## 2014-01-09 DIAGNOSIS — N259 Disorder resulting from impaired renal tubular function, unspecified: Secondary | ICD-10-CM

## 2014-01-09 DIAGNOSIS — Z87898 Personal history of other specified conditions: Secondary | ICD-10-CM

## 2014-01-09 DIAGNOSIS — Z0000 Encounter for general adult medical examination without abnormal findings: Secondary | ICD-10-CM | POA: Insufficient documentation

## 2014-01-09 DIAGNOSIS — I10 Essential (primary) hypertension: Secondary | ICD-10-CM | POA: Diagnosis not present

## 2014-01-09 DIAGNOSIS — E119 Type 2 diabetes mellitus without complications: Secondary | ICD-10-CM | POA: Diagnosis not present

## 2014-01-09 DIAGNOSIS — D649 Anemia, unspecified: Secondary | ICD-10-CM | POA: Insufficient documentation

## 2014-01-09 MED ORDER — CYCLOBENZAPRINE HCL 10 MG PO TABS: ORAL_TABLET | ORAL | Status: DC

## 2014-01-09 MED ORDER — CLOBETASOL PROPIONATE 0.05 % EX OINT: TOPICAL_OINTMENT | CUTANEOUS | Status: DC | PRN

## 2014-01-09 MED ORDER — CLOTRIMAZOLE-BETAMETHASONE 1-0.05 % EX CREA: 1.0000 "application " | TOPICAL_CREAM | Freq: Every day | CUTANEOUS | Status: DC | PRN
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
## 2014-01-09 NOTE — Patient Instructions (Addendum)
See dermatology for your annual exam -mention the spot in ear  At check out - we will do referral to GI for anemia and abdominal pain  Don't forget to schedule your mammogram Flu shot and prevnar shot today  If you are interested in a shingles/zoster vaccine - call your insurance to check on coverage,( you should not get it within 1 month of other vaccines) , then call us for a prescription  for it to take to a pharmacy that gives the shot , or make a nurse visit to get it here depending on your coverage After your GI work up is complete- please call us so I can refer you to a [place] neurologist for headaches and dizziness  Make appt with Dr Lorelei Pont for shoulder any time you want  If you are interested in counseling for stress please let me know

## 2014-01-09 NOTE — Progress Notes (Signed)
Pre visit review using our clinic review tool, if applicable. No additional management support is needed unless otherwise documented below in the visit note. 

## 2014-01-10 NOTE — Assessment & Plan Note (Signed)
Reviewed health habits including diet and exercise and skin cancer prevention Reviewed appropriate screening tests for age  Also reviewed health mt list, fam hx and immunization status , as well as social and family history   See HPI Will update flu and prevnar vaccines today

## 2014-01-10 NOTE — Assessment & Plan Note (Signed)
Diffuse and vague with recent ? Gastroenteritis No focal findings  New worsened anemia  Ref to GI

## 2014-01-10 NOTE — Assessment & Plan Note (Signed)
Lab Results  Component Value Date   HGBA1C 6.7* 01/02/2014   Stable  Enc better diet-low glycemic

## 2014-01-10 NOTE — Assessment & Plan Note (Signed)
bp in fair control at this time  BP Readings from Last 1 Encounters:  01/09/14 140/54   No changes needed Disc lifstyle change with low sodium diet and exercise  Tested her cuff and it is 10 points high  Rev dash diet

## 2014-01-10 NOTE — Assessment & Plan Note (Signed)
These are ongoing and troublesome  Rev last neurol note I do think she needs f/u with neuro to continue eval and tx plan  Will wait until after GI w/u - she will call with ref to [facility] neuro

## 2014-01-10 NOTE — Assessment & Plan Note (Signed)
Disc goals for lipids and reasons to control them Rev labs with pt Rev low sat fat diet in detail Controlled well with crestor

## 2014-01-10 NOTE — Assessment & Plan Note (Signed)
Hb is 10.3- down a point  Also diffuse abd pain Ref to GI

## 2014-01-10 NOTE — Assessment & Plan Note (Signed)
Improved with lasix instead of hctz  Lab Results  Component Value Date   CREATININE 1.0 01/02/2014   will continue to watch  Disc imp of hydration

## 2014-02-08 ENCOUNTER — Other Ambulatory Visit: Payer: Self-pay | Admitting: Family Medicine

## 2014-02-08 NOTE — Telephone Encounter (Signed)
Electronic refill request please advise  

## 2014-02-08 NOTE — Telephone Encounter (Signed)
Please refill for a year  

## 2014-02-18 ENCOUNTER — Other Ambulatory Visit: Payer: Self-pay | Admitting: Family Medicine

## 2014-02-19 NOTE — Telephone Encounter (Signed)
Please refill for a year  

## 2014-02-19 NOTE — Telephone Encounter (Signed)
Electronic refill request, please advise  

## 2014-02-19 NOTE — Telephone Encounter (Signed)
Rx was refilled for a year last month, this Rx was declined (duplicate)

## 2014-02-28 DIAGNOSIS — D3132 Benign neoplasm of left choroid: Secondary | ICD-10-CM | POA: Diagnosis not present

## 2014-02-28 DIAGNOSIS — H524 Presbyopia: Secondary | ICD-10-CM | POA: Diagnosis not present

## 2014-02-28 DIAGNOSIS — H35412 Lattice degeneration of retina, left eye: Secondary | ICD-10-CM | POA: Diagnosis not present

## 2014-02-28 DIAGNOSIS — E119 Type 2 diabetes mellitus without complications: Secondary | ICD-10-CM | POA: Diagnosis not present

## 2014-02-28 DIAGNOSIS — H2513 Age-related nuclear cataract, bilateral: Secondary | ICD-10-CM | POA: Diagnosis not present

## 2014-02-28 LAB — HM DIABETES EYE EXAM

## 2014-03-09 ENCOUNTER — Encounter: Payer: Self-pay | Admitting: Internal Medicine

## 2014-03-09 ENCOUNTER — Other Ambulatory Visit (INDEPENDENT_AMBULATORY_CARE_PROVIDER_SITE_OTHER): Payer: Medicare Other

## 2014-03-09 ENCOUNTER — Ambulatory Visit (INDEPENDENT_AMBULATORY_CARE_PROVIDER_SITE_OTHER): Payer: Medicare Other | Admitting: Internal Medicine

## 2014-03-09 VITALS — BP 146/60 | HR 60 | Ht 63.5 in | Wt 225.4 lb

## 2014-03-09 DIAGNOSIS — K589 Irritable bowel syndrome without diarrhea: Secondary | ICD-10-CM

## 2014-03-09 DIAGNOSIS — R159 Full incontinence of feces: Secondary | ICD-10-CM

## 2014-03-09 DIAGNOSIS — E538 Deficiency of other specified B group vitamins: Secondary | ICD-10-CM | POA: Diagnosis not present

## 2014-03-09 DIAGNOSIS — D509 Iron deficiency anemia, unspecified: Secondary | ICD-10-CM | POA: Diagnosis not present

## 2014-03-09 DIAGNOSIS — R152 Fecal urgency: Secondary | ICD-10-CM

## 2014-03-09 LAB — CBC WITH DIFFERENTIAL/PLATELET
BASOS ABS: 0 10*3/uL (ref 0.0–0.1)
Basophils Relative: 0.4 % (ref 0.0–3.0)
Eosinophils Absolute: 0.3 10*3/uL (ref 0.0–0.7)
Eosinophils Relative: 2.7 % (ref 0.0–5.0)
HCT: 31.7 % — ABNORMAL LOW (ref 36.0–46.0)
Hemoglobin: 10.3 g/dL — ABNORMAL LOW (ref 12.0–15.0)
LYMPHS PCT: 17.5 % (ref 12.0–46.0)
Lymphs Abs: 1.9 10*3/uL (ref 0.7–4.0)
MCHC: 32.6 g/dL (ref 30.0–36.0)
MCV: 79 fl (ref 78.0–100.0)
MONOS PCT: 2.3 % — AB (ref 3.0–12.0)
Monocytes Absolute: 0.3 10*3/uL (ref 0.1–1.0)
Neutro Abs: 8.5 10*3/uL — ABNORMAL HIGH (ref 1.4–7.7)
Neutrophils Relative %: 77.1 % — ABNORMAL HIGH (ref 43.0–77.0)
PLATELETS: 333 10*3/uL (ref 150.0–400.0)
RBC: 4.02 Mil/uL (ref 3.87–5.11)
RDW: 15.3 % (ref 11.5–15.5)
WBC: 11 10*3/uL — ABNORMAL HIGH (ref 4.0–10.5)

## 2014-03-09 LAB — FERRITIN: Ferritin: 7 ng/mL — ABNORMAL LOW (ref 10.0–291.0)

## 2014-03-09 LAB — VITAMIN B12: Vitamin B-12: 477 pg/mL (ref 211–911)

## 2014-03-09 NOTE — Progress Notes (Signed)
   Subjective:    Patient ID: Ann Cummings, female    DOB: May 26, 1940, 73 y.o.   MRN: [id number]  HPI The patient is here with her daughter. She has a hx of colonic adenomas and is due/overdue for surveillance colonoscopy. She has recurrent iron deficiency anemia. Is on oral B12 for B12 deficiency. She has been using NSAID intermittently for arhtritis. Is on omeprazole 20 mg daily.  Sxs are some urgent defecation and urge incontinence. She has a hx of IBS and similar sxs.  Medications, allergies, past medical history, past surgical history, family history and social history are reviewed and updated in the EMR.    Review of Systems Rare urine incont All other ROS negative    Objective:   Physical Exam General:  Well-developed, well-nourished and in no acute distress - obese Eyes:  anicteric. ENT:   Mouth and posterior pharynx free of lesions. + dentures.diag  Neck:   supple w/o thyromegaly or mass.  Lungs: Clear to auscultation bilaterally. Heart:  S1S2, no rubs, murmurs, gallops. Abdomen:  obese, soft,  Mild LLQ tenderness, no hepatosplenomegaly or mass and BS+.  Rectal: deferred Lymph:  no cervical or supraclavicular adenopathy. Extremities:   no edema Skin   no rash. Neuro:  A&O x 3.  Psych:  appropriate mood and  Affect.   Data Reviewed: PCP notes, labs, prior GI procedures and pathology       Assessment & Plan:  Anemia, iron deficiency - Plan: Ambulatory referral to Gastroenterology, CBC with Differential, Ferritin  B12 deficiency - Plan: Vitamin B12  IBS  Incontinence of feces with fecal urgency  1) [facility] EGD and colonoscopy 2) The risks and benefits as well as alternatives of endoscopic procedure(s) have been discussed and reviewed. All questions answered. The patient agrees to proceed. 3) CBC, ferritin and B12 today

## 2014-03-09 NOTE — Patient Instructions (Addendum)
Your physician has requested that you go to the basement for the following lab work before leaving today: CBC/diff, Ferritin, B12  You have been scheduled for an endoscopy and colonoscopy. Please follow the written instructions given to you at your visit today. Please pick up your prep supplies at the pharmacy. If you use inhalers (even only as needed), please bring them with you on the day of your procedure. Your physician has requested that you go to www.[email or web address].[email or web address] and enter the access code given to you at your visit today. This web site gives a general overview about your procedure. However, you should still follow specific instructions given to you by our office regarding your preparation for the procedure.  Start Align one daily, coupon provided.  I appreciate the opportunity to care for you.

## 2014-03-09 NOTE — Assessment & Plan Note (Signed)
Align

## 2014-03-12 ENCOUNTER — Other Ambulatory Visit: Payer: Self-pay

## 2014-03-12 DIAGNOSIS — D509 Iron deficiency anemia, unspecified: Secondary | ICD-10-CM

## 2014-03-12 NOTE — Progress Notes (Signed)
Quick Note:  Hgb same B12 ok Iron is low She needs feraheme injection x 2 please ______

## 2014-03-13 ENCOUNTER — Encounter: Payer: Self-pay | Admitting: Family Medicine

## 2014-03-22 ENCOUNTER — Other Ambulatory Visit (HOSPITAL_COMMUNITY): Payer: Self-pay | Admitting: Internal Medicine

## 2014-03-22 ENCOUNTER — Encounter (HOSPITAL_COMMUNITY): Payer: Self-pay

## 2014-03-22 ENCOUNTER — Encounter (HOSPITAL_COMMUNITY)
Admission: RE | Admit: 2014-03-22 | Discharge: 2014-03-22 | Disposition: A | Discharge status: Home / Self Care | Payer: Medicare Other | Source: Ambulatory Visit | Attending: Internal Medicine | Admitting: Internal Medicine

## 2014-03-22 VITALS — BP 123/74 | HR 55 | Temp 99.3°F | Resp 16

## 2014-03-22 DIAGNOSIS — D509 Iron deficiency anemia, unspecified: Secondary | ICD-10-CM

## 2014-03-22 MED ORDER — SODIUM CHLORIDE 0.9 % IV SOLN
Freq: Once | INTRAVENOUS | Status: AC
  Administered 2014-03-22: 10:00:00 via INTRAVENOUS

## 2014-03-22 MED ORDER — FERUMOXYTOL INJECTION 510 MG/17 ML: 510.0000 mg | INTRAVENOUS | Status: DC

## 2014-03-22 MED ORDER — FERUMOXYTOL INJECTION 510 MG/17 ML
510.0000 mg | Freq: Once | INTRAVENOUS | Status: AC
  Administered 2014-03-22: 510 mg via INTRAVENOUS
  Filled 2014-03-22: qty 17

## 2014-03-22 NOTE — Discharge Instructions (Signed)

## 2014-03-23 ENCOUNTER — Other Ambulatory Visit: Payer: Self-pay | Admitting: Family Medicine

## 2014-03-27 ENCOUNTER — Other Ambulatory Visit: Payer: Self-pay

## 2014-03-27 DIAGNOSIS — D509 Iron deficiency anemia, unspecified: Secondary | ICD-10-CM

## 2014-03-28 ENCOUNTER — Other Ambulatory Visit: Payer: Self-pay

## 2014-03-28 MED ORDER — ROSUVASTATIN CALCIUM 10 MG PO TABS: ORAL_TABLET | ORAL | Status: DC

## 2014-03-28 NOTE — Telephone Encounter (Signed)
Pt request 30 day refill on crestor to [facility]; advised pt done.

## 2014-03-29 ENCOUNTER — Encounter (HOSPITAL_COMMUNITY)
Admission: RE | Admit: 2014-03-29 | Discharge: 2014-03-29 | Disposition: A | Discharge status: Home / Self Care | Payer: Medicare Other | Source: Ambulatory Visit | Attending: Internal Medicine | Admitting: Internal Medicine

## 2014-03-29 ENCOUNTER — Other Ambulatory Visit (HOSPITAL_COMMUNITY): Payer: Self-pay | Admitting: Internal Medicine

## 2014-03-29 ENCOUNTER — Encounter (HOSPITAL_COMMUNITY): Payer: Self-pay

## 2014-03-29 VITALS — BP 141/50 | HR 56 | Temp 99.0°F | Resp 20

## 2014-03-29 DIAGNOSIS — D509 Iron deficiency anemia, unspecified: Secondary | ICD-10-CM

## 2014-03-29 MED ORDER — SODIUM CHLORIDE 0.9 % IV SOLN
Freq: Once | INTRAVENOUS | Status: AC
  Administered 2014-03-29: 13:00:00 via INTRAVENOUS

## 2014-03-29 MED ORDER — FERUMOXYTOL INJECTION 510 MG/17 ML
510.0000 mg | Freq: Once | INTRAVENOUS | Status: AC
  Administered 2014-03-29: 510 mg via INTRAVENOUS
  Filled 2014-03-29: qty 17

## 2014-03-29 NOTE — Discharge Instructions (Signed)

## 2014-04-23 ENCOUNTER — Ambulatory Visit (AMBULATORY_SURGERY_CENTER): Payer: Medicare Other | Admitting: Internal Medicine

## 2014-04-23 ENCOUNTER — Other Ambulatory Visit (INDEPENDENT_AMBULATORY_CARE_PROVIDER_SITE_OTHER): Payer: Medicare Other

## 2014-04-23 ENCOUNTER — Encounter: Payer: Self-pay | Admitting: Internal Medicine

## 2014-04-23 ENCOUNTER — Other Ambulatory Visit: Payer: Self-pay | Admitting: Family Medicine

## 2014-04-23 VITALS — BP 157/73 | HR 66 | Temp 98.5°F | Resp 16 | Ht 63.5 in | Wt 225.0 lb

## 2014-04-23 DIAGNOSIS — D122 Benign neoplasm of ascending colon: Secondary | ICD-10-CM | POA: Diagnosis not present

## 2014-04-23 DIAGNOSIS — G4730 Sleep apnea, unspecified: Secondary | ICD-10-CM | POA: Diagnosis not present

## 2014-04-23 DIAGNOSIS — Z860101 Personal history of adenomatous and serrated colon polyps: Secondary | ICD-10-CM

## 2014-04-23 DIAGNOSIS — D124 Benign neoplasm of descending colon: Secondary | ICD-10-CM

## 2014-04-23 DIAGNOSIS — K621 Rectal polyp: Secondary | ICD-10-CM | POA: Diagnosis not present

## 2014-04-23 DIAGNOSIS — D649 Anemia, unspecified: Secondary | ICD-10-CM | POA: Diagnosis not present

## 2014-04-23 DIAGNOSIS — D509 Iron deficiency anemia, unspecified: Secondary | ICD-10-CM

## 2014-04-23 DIAGNOSIS — Z86010 Personal history of colonic polyps: Secondary | ICD-10-CM

## 2014-04-23 DIAGNOSIS — Z8673 Personal history of transient ischemic attack (TIA), and cerebral infarction without residual deficits: Secondary | ICD-10-CM | POA: Diagnosis not present

## 2014-04-23 DIAGNOSIS — R194 Change in bowel habit: Secondary | ICD-10-CM | POA: Diagnosis not present

## 2014-04-23 DIAGNOSIS — D128 Benign neoplasm of rectum: Secondary | ICD-10-CM

## 2014-04-23 LAB — GLUCOSE, CAPILLARY
GLUCOSE-CAPILLARY: 85 mg/dL (ref 70–99)
Glucose-Capillary: 82 mg/dL (ref 70–99)

## 2014-04-23 LAB — CBC WITH DIFFERENTIAL/PLATELET
BASOS ABS: 0.1 10*3/uL (ref 0.0–0.1)
Basophils Relative: 0.8 % (ref 0.0–3.0)
Eosinophils Absolute: 0.2 10*3/uL (ref 0.0–0.7)
Eosinophils Relative: 2.4 % (ref 0.0–5.0)
HCT: 35.1 % — ABNORMAL LOW (ref 36.0–46.0)
Hemoglobin: 11.6 g/dL — ABNORMAL LOW (ref 12.0–15.0)
LYMPHS PCT: 22 % (ref 12.0–46.0)
Lymphs Abs: 1.8 10*3/uL (ref 0.7–4.0)
MCHC: 33.1 g/dL (ref 30.0–36.0)
MCV: 82.9 fl (ref 78.0–100.0)
Monocytes Absolute: 0.6 10*3/uL (ref 0.1–1.0)
Monocytes Relative: 8 % (ref 3.0–12.0)
NEUTROS PCT: 66.8 % (ref 43.0–77.0)
Neutro Abs: 5.4 10*3/uL (ref 1.4–7.7)
Platelets: 264 10*3/uL (ref 150.0–400.0)
RBC: 4.23 Mil/uL (ref 3.87–5.11)
RDW: 20.5 % — AB (ref 11.5–15.5)
WBC: 8.1 10*3/uL (ref 4.0–10.5)

## 2014-04-23 MED ORDER — SODIUM CHLORIDE 0.9 % IV SOLN: 500.0000 mL | INTRAVENOUS | Status: DC

## 2014-04-23 NOTE — Progress Notes (Signed)
Called to room to assist during endoscopic procedure.  Patient ID and intended procedure confirmed with present staff. Received instructions for my participation in the procedure from the performing physician.  

## 2014-04-23 NOTE — Patient Instructions (Addendum)
I found and removed 4 tiny polyps but no other abnormalities on the exams.  Will check blood count and iron level today and let you know.  I appreciate the opportunity to care for you. Gatha Mayer, MD, FACG  YOU HAD AN ENDOSCOPIC PROCEDURE TODAY AT [facility]: Refer to the procedure report that was given to you for any specific questions about what was found during the examination.  If the procedure report does not answer your questions, please call your gastroenterologist to clarify.  If you requested that your care partner not be given the details of your procedure findings, then the procedure report has been included in a sealed envelope for you to review at your convenience later.  YOU SHOULD EXPECT: Some feelings of bloating in the abdomen. Passage of more gas than usual.  Walking can help get rid of the air that was put into your GI tract during the procedure and reduce the bloating. If you had a lower endoscopy (such as a colonoscopy or flexible sigmoidoscopy) you may notice spotting of blood in your stool or on the toilet paper. If you underwent a bowel prep for your procedure, then you may not have a normal bowel movement for a few days.  DIET: Your first meal following the procedure should be a light meal and then it is ok to progress to your normal diet.  A half-sandwich or bowl of soup is an example of a good first meal.  Heavy or fried foods are harder to digest and may make you feel nauseous or bloated.  Likewise meals heavy in dairy and vegetables can cause extra gas to form and this can also increase the bloating.  Drink plenty of fluids but you should avoid alcoholic beverages for 24 hours.  ACTIVITY: Your care partner should take you home directly after the procedure.  You should plan to take it easy, moving slowly for the rest of the day.  You can resume normal activity the day after the procedure however you should NOT DRIVE or use heavy machinery for  24 hours (because of the sedation medicines used during the test).    SYMPTOMS TO REPORT IMMEDIATELY: A gastroenterologist can be reached at any hour.  During normal business hours, 8:30 AM to 5:00 PM Monday through Friday, call [phone].  After hours and on weekends, please call the GI answering service at [phone] who will take a message and have the physician on call contact you.   Following lower endoscopy (colonoscopy or flexible sigmoidoscopy):  Excessive amounts of blood in the stool  Significant tenderness or worsening of abdominal pains  Swelling of the abdomen that is new, acute  Fever of 100F or higher  Following upper endoscopy (EGD)  Vomiting of blood or coffee ground material  New chest pain or pain under the shoulder blades  Painful or persistently difficult swallowing  New shortness of breath  Fever of 100F or higher  Black, tarry-looking stools  FOLLOW UP: If any biopsies were taken you will be contacted by phone or by letter within the next 1-3 weeks.  Call your gastroenterologist if you have not heard about the biopsies in 3 weeks.  Our staff will call the home number listed on your records the next business day following your procedure to check on you and address any questions or concerns that you may have at that time regarding the information given to you following your procedure. This is a courtesy call and  so if there is no answer at the home number and we have not heard from you through the emergency physician on call, we will assume that you have returned to your regular daily activities without incident.  SIGNATURES/CONFIDENTIALITY: You and/or your care partner have signed paperwork which will be entered into your electronic medical record.  These signatures attest to the fact that that the information above on your After Visit Summary has been reviewed and is understood.  Full responsibility of the confidentiality of this discharge information lies  with you and/or your care-partner.  Polyp information given.  Dr. Carlean Purl will be in touch.

## 2014-04-23 NOTE — Progress Notes (Signed)
A/ox3, pleased with MAC, report to RN 

## 2014-04-23 NOTE — Op Note (Signed)
[place]  [address] [place], [id number]   COLONOSCOPY PROCEDURE REPORT  PATIENT: Ann Cummings, Patch  MR#: [id number] BIRTHDATE: [birth year] , 73  yrs. old GENDER: female ENDOSCOPIST: Gatha Mayer, MD, [facility] PROCEDURE DATE:  04/23/2014 PROCEDURE:   Colonoscopy with biopsy and Colonoscopy with snare polypectomy First Screening Colonoscopy - Avg.  risk and is 50 yrs.  old or older - No.  Prior Negative Screening - Now for repeat screening. N/A  History of Adenoma - Now for follow-up colonoscopy & has been > or = to 3 yrs.  Yes hx of adenoma.  Has been 3 or more years since last colonoscopy.  Polyps Removed Today? Yes. ASA CLASS:   Class III INDICATIONS:iron deficiency anemia. MEDICATIONS: Residual sedation present, Propofol 150 mg IV, and Monitored anesthesia care  DESCRIPTION OF PROCEDURE:   After the risks benefits and alternatives of the procedure were thoroughly explained, informed consent was obtained.  The digital rectal exam revealed no rectal mass and revealed decreased sphincter tone.   The LB [id number]  endoscope was introduced through the anus and advanced to the cecum, which was identified by both the appendix and ileocecal valve. No adverse events experienced.   The quality of the prep was excellent, using MiraLax  The instrument was then slowly withdrawn as the colon was fully examined.   COLON FINDINGS: 1) 4 diminutive polyps removed.  Ascending x 2 (cold bx) Descending x 1 (cold snare) and rectum x 1 cold bx.  All sent to pathology.  2) otherwise normal exam of colon and rectum. - could see tattoo from 2011 in right colon. Retroflexed views revealed no abnormalities. The time to cecum=2 minutes 41 seconds. Withdrawal time=10 minutes 26 seconds.  The scope was withdrawn and the procedure completed. COMPLICATIONS: There were no immediate complications.  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 try support with iron vs.  further w/u (small bowel) - will discuss.  Not sure it would change Tx.  eSigned:  Gatha Mayer, MD, [facility] 04/23/2014 4:31 PM   cc: Loura Pardon, MD and The Patient

## 2014-04-24 ENCOUNTER — Telehealth: Payer: Self-pay

## 2014-04-24 ENCOUNTER — Other Ambulatory Visit: Payer: Self-pay

## 2014-04-24 DIAGNOSIS — D649 Anemia, unspecified: Secondary | ICD-10-CM

## 2014-04-24 LAB — FERRITIN: Ferritin: 273.5 ng/mL (ref 10.0–291.0)

## 2014-04-24 NOTE — Telephone Encounter (Signed)
  Follow up Call-  Call back number 04/23/2014  Post procedure Call Back phone  # [phone] hm  Permission to leave phone message Yes     Patient questions:  Do you have a fever, pain , or abdominal swelling? No. Pain Score  0 *  Have you tolerated food without any problems? Yes.    Have you been able to return to your normal activities? Yes.    Do you have any questions about your discharge instructions: Diet   No. Medications  No. Follow up visit  No.  Do you have questions or concerns about your Care? No.  Actions: * If pain score is 4 or above: No action needed, pain <4.

## 2014-04-24 NOTE — Progress Notes (Signed)
Quick Note:  I notified her through my chart regarding results I want her to have a CBC in our lab in 2 months, please arrange I am copying her PCP ______

## 2014-04-25 NOTE — Op Note (Signed)
[place]  [address] [place], [id number]   ENDOSCOPY PROCEDURE REPORT  PATIENT: Ann Cummings, Ann Cummings  MR#: [id number] BIRTHDATE: 08/21/1940 , 36  yrs. old GENDER: female ENDOSCOPIST: Gatha Mayer, MD, [facility] PROCEDURE DATE:  04/23/2014 PROCEDURE:  EGD, diagnostic ASA CLASS:     Class III INDICATIONS:  iron deficiency anemia. MEDICATIONS: Propofol 150 mg IV and Monitored anesthesia care TOPICAL ANESTHETIC: none  DESCRIPTION OF PROCEDURE: After the risks benefits and alternatives of the procedure were thoroughly explained, informed consent was obtained.  The LB [id number] endoscope was introduced through the mouth and advanced to the second portion of the duodenum , Without limitations.  The instrument was slowly withdrawn as the mucosa was fully examined.      EXAM: The esophagus and gastroesophageal junction were completely normal in appearance.  The stomach was entered and closely examined.The antrum, angularis, and lesser curvature were well visualized, including a retroflexed view of the cardia and fundus. The stomach wall was normally distensable.  The scope passed easily through the pylorus into the duodenum.  Retroflexed views revealed no abnormalities.     The scope was then withdrawn from the patient and the procedure completed.  COMPLICATIONS: There were no immediate complications.  ENDOSCOPIC IMPRESSION: Normal appearing esophagus and GE junction, the stomach was well visualized and normal in appearance, normal appearing duodenum  RECOMMENDATIONS: Proceed with a Colonoscopy as no cause of anemia seen. Has had biopsies to look for celic disease before (negative)   eSigned:  Gatha Mayer, MD, [facility] 04/23/2014 4:20 PM    CC:The Patient and Loura Pardon, MD

## 2014-04-26 ENCOUNTER — Encounter: Payer: Self-pay | Admitting: Internal Medicine

## 2014-04-26 NOTE — Progress Notes (Signed)
Quick Note:  2 diminutive adenomas Repeat routine colonoscopy 2020/1 ______

## 2014-04-30 ENCOUNTER — Telehealth: Payer: Self-pay

## 2014-04-30 NOTE — Telephone Encounter (Signed)
Last eye exam is in chart

## 2014-04-30 NOTE — Telephone Encounter (Signed)
Thanks - I think it is already in the chart

## 2014-04-30 NOTE — Telephone Encounter (Signed)
pt received automated call cking that eye exams are up to date; pt said she has been to [facility] and [facility] this year. Pt wanted Dr Glori Bickers to be notified in case she needed to know.

## 2014-05-21 ENCOUNTER — Other Ambulatory Visit: Payer: Self-pay | Admitting: Family Medicine

## 2014-05-25 DIAGNOSIS — D485 Neoplasm of uncertain behavior of skin: Secondary | ICD-10-CM | POA: Diagnosis not present

## 2014-05-25 DIAGNOSIS — L820 Inflamed seborrheic keratosis: Secondary | ICD-10-CM | POA: Diagnosis not present

## 2014-05-25 DIAGNOSIS — L400 Psoriasis vulgaris: Secondary | ICD-10-CM | POA: Diagnosis not present

## 2014-06-12 ENCOUNTER — Other Ambulatory Visit: Payer: Self-pay | Admitting: Family Medicine

## 2014-06-12 NOTE — Telephone Encounter (Signed)
Please refill for 6 months 

## 2014-07-04 ENCOUNTER — Other Ambulatory Visit (INDEPENDENT_AMBULATORY_CARE_PROVIDER_SITE_OTHER): Payer: Medicare Other

## 2014-07-04 DIAGNOSIS — D649 Anemia, unspecified: Secondary | ICD-10-CM

## 2014-07-04 LAB — CBC WITH DIFFERENTIAL/PLATELET
BASOS PCT: 0.3 % (ref 0.0–3.0)
Basophils Absolute: 0 10*3/uL (ref 0.0–0.1)
EOS ABS: 0.2 10*3/uL (ref 0.0–0.7)
Eosinophils Relative: 2.8 % (ref 0.0–5.0)
HCT: 38.7 % (ref 36.0–46.0)
HEMOGLOBIN: 13.2 g/dL (ref 12.0–15.0)
Lymphocytes Relative: 21.5 % (ref 12.0–46.0)
Lymphs Abs: 1.8 10*3/uL (ref 0.7–4.0)
MCHC: 34 g/dL (ref 30.0–36.0)
MCV: 86.9 fl (ref 78.0–100.0)
MONOS PCT: 2.4 % — AB (ref 3.0–12.0)
Monocytes Absolute: 0.2 10*3/uL (ref 0.1–1.0)
NEUTROS ABS: 6.1 10*3/uL (ref 1.4–7.7)
Neutrophils Relative %: 73 % (ref 43.0–77.0)
Platelets: 301 10*3/uL (ref 150.0–400.0)
RBC: 4.45 Mil/uL (ref 3.87–5.11)
RDW: 14.9 % (ref 11.5–15.5)
WBC: 8.3 10*3/uL (ref 4.0–10.5)

## 2014-07-05 NOTE — Progress Notes (Signed)
Quick Note:  CBC looks good needs a repeat CBC in 4 months - dx iron deficiency anemia ______

## 2014-07-06 ENCOUNTER — Other Ambulatory Visit: Payer: Self-pay

## 2014-07-06 DIAGNOSIS — D509 Iron deficiency anemia, unspecified: Secondary | ICD-10-CM

## 2014-08-16 ENCOUNTER — Ambulatory Visit
Admit: 2014-08-16 | Disposition: A | Discharge status: Home / Self Care | Payer: Self-pay | Attending: Family Medicine | Admitting: Family Medicine

## 2014-08-16 DIAGNOSIS — Z1231 Encounter for screening mammogram for malignant neoplasm of breast: Secondary | ICD-10-CM | POA: Diagnosis not present

## 2014-08-17 ENCOUNTER — Encounter: Payer: Self-pay | Admitting: Family Medicine

## 2014-09-24 ENCOUNTER — Other Ambulatory Visit: Payer: Self-pay | Admitting: Family Medicine

## 2014-09-24 NOTE — Telephone Encounter (Signed)
Electronic refill request, last OV was 01/09/14 and no future appt., please advise

## 2014-09-24 NOTE — Telephone Encounter (Signed)
appt scheduled and med refilled 

## 2014-09-24 NOTE — Telephone Encounter (Signed)
Please schedule f/u in Aug and refill until then

## 2014-10-25 ENCOUNTER — Other Ambulatory Visit: Payer: Self-pay | Admitting: Family Medicine

## 2014-11-07 ENCOUNTER — Other Ambulatory Visit (INDEPENDENT_AMBULATORY_CARE_PROVIDER_SITE_OTHER): Payer: Medicare Other

## 2014-11-07 DIAGNOSIS — D509 Iron deficiency anemia, unspecified: Secondary | ICD-10-CM

## 2014-11-07 LAB — CBC WITH DIFFERENTIAL/PLATELET
BASOS PCT: 0.8 % (ref 0.0–3.0)
Basophils Absolute: 0.1 10*3/uL (ref 0.0–0.1)
EOS PCT: 2.9 % (ref 0.0–5.0)
Eosinophils Absolute: 0.3 10*3/uL (ref 0.0–0.7)
HEMATOCRIT: 37.4 % (ref 36.0–46.0)
HEMOGLOBIN: 12.7 g/dL (ref 12.0–15.0)
LYMPHS ABS: 2.1 10*3/uL (ref 0.7–4.0)
Lymphocytes Relative: 17.9 % (ref 12.0–46.0)
MCHC: 34 g/dL (ref 30.0–36.0)
MCV: 88.8 fl (ref 78.0–100.0)
MONOS PCT: 4.9 % (ref 3.0–12.0)
Monocytes Absolute: 0.6 10*3/uL (ref 0.1–1.0)
Neutro Abs: 8.5 10*3/uL — ABNORMAL HIGH (ref 1.4–7.7)
Neutrophils Relative %: 73.5 % (ref 43.0–77.0)
Platelets: 301 10*3/uL (ref 150.0–400.0)
RBC: 4.21 Mil/uL (ref 3.87–5.11)
RDW: 12.9 % (ref 11.5–15.5)
WBC: 11.6 10*3/uL — ABNORMAL HIGH (ref 4.0–10.5)

## 2014-11-26 ENCOUNTER — Other Ambulatory Visit: Payer: Self-pay | Admitting: Family Medicine

## 2014-11-29 ENCOUNTER — Other Ambulatory Visit: Payer: Self-pay | Admitting: Family Medicine

## 2014-12-25 ENCOUNTER — Ambulatory Visit: Payer: BLUE CROSS/BLUE SHIELD | Admitting: Family Medicine

## 2015-01-01 ENCOUNTER — Encounter: Payer: Self-pay | Admitting: Family Medicine

## 2015-01-01 ENCOUNTER — Ambulatory Visit (INDEPENDENT_AMBULATORY_CARE_PROVIDER_SITE_OTHER): Payer: Medicare Other | Admitting: Family Medicine

## 2015-01-01 VITALS — BP 174/68 | HR 53 | Temp 98.9°F | Ht 63.5 in | Wt 222.0 lb

## 2015-01-01 DIAGNOSIS — D518 Other vitamin B12 deficiency anemias: Secondary | ICD-10-CM

## 2015-01-01 DIAGNOSIS — I10 Essential (primary) hypertension: Secondary | ICD-10-CM

## 2015-01-01 DIAGNOSIS — E119 Type 2 diabetes mellitus without complications: Secondary | ICD-10-CM | POA: Diagnosis not present

## 2015-01-01 DIAGNOSIS — E785 Hyperlipidemia, unspecified: Secondary | ICD-10-CM | POA: Diagnosis not present

## 2015-01-01 DIAGNOSIS — N289 Disorder of kidney and ureter, unspecified: Secondary | ICD-10-CM | POA: Diagnosis not present

## 2015-01-01 DIAGNOSIS — D509 Iron deficiency anemia, unspecified: Secondary | ICD-10-CM

## 2015-01-01 MED ORDER — DICYCLOMINE HCL 20 MG PO TABS: ORAL_TABLET | ORAL | Status: DC

## 2015-01-01 MED ORDER — FUROSEMIDE 20 MG PO TABS: 20.0000 mg | ORAL_TABLET | Freq: Every day | ORAL | Status: DC

## 2015-01-01 MED ORDER — CRESTOR 10 MG PO TABS: 5.0000 mg | ORAL_TABLET | Freq: Every day | ORAL | Status: DC

## 2015-01-01 MED ORDER — ATENOLOL 25 MG PO TABS: 12.5000 mg | ORAL_TABLET | Freq: Every day | ORAL | Status: DC

## 2015-01-01 MED ORDER — CITALOPRAM HYDROBROMIDE 40 MG PO TABS: 40.0000 mg | ORAL_TABLET | Freq: Every day | ORAL | Status: DC

## 2015-01-01 MED ORDER — OMEPRAZOLE 20 MG PO CPDR: DELAYED_RELEASE_CAPSULE | ORAL | Status: DC

## 2015-01-01 MED ORDER — AMLODIPINE BESY-BENAZEPRIL HCL 10-20 MG PO CAPS: ORAL_CAPSULE | ORAL | Status: DC
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
## 2015-01-01 NOTE — Progress Notes (Signed)
Pre visit review using our clinic review tool, if applicable. No additional management support is needed unless otherwise documented below in the visit note. 

## 2015-01-01 NOTE — Patient Instructions (Signed)
Schedule fasting labs in 2 weeks  Follow up with me in about 1 month  Let's refill your medicines and then review everything when you return

## 2015-01-02 NOTE — Assessment & Plan Note (Signed)
Lab planned  F/u after to review  Pt has had IV iron tx with good success Has also seen GI

## 2015-01-02 NOTE — Assessment & Plan Note (Signed)
Due for lab- will put off 2 wk since she has been out of medicine Disc goals for lipids and reasons to control them Rev labs with pt from last check Rev low sat fat diet in detail

## 2015-01-02 NOTE — Assessment & Plan Note (Addendum)
bp is high but out of med for a week and did not call for refill  Obese/habits are fair  BP: (!) 174/68 mmHg will get back on her HTN medication and f/u- Tenormin/ lotrel and lasix  Rev optimal diet/exercise/DASH diet- pt is not very motivated for change

## 2015-01-02 NOTE — Assessment & Plan Note (Signed)
Lab today and f/u Taking 1000 mcg daily

## 2015-01-02 NOTE — Assessment & Plan Note (Signed)
Overdue for labs Disc ways to inc her water intake

## 2015-01-02 NOTE — Assessment & Plan Note (Signed)
Overdue for f/u A1C today  F/u planned  Disc low glycemic diet  Wt loss enc

## 2015-01-15 ENCOUNTER — Other Ambulatory Visit (INDEPENDENT_AMBULATORY_CARE_PROVIDER_SITE_OTHER): Payer: Medicare Other

## 2015-01-15 DIAGNOSIS — E119 Type 2 diabetes mellitus without complications: Secondary | ICD-10-CM

## 2015-01-15 DIAGNOSIS — E785 Hyperlipidemia, unspecified: Secondary | ICD-10-CM

## 2015-01-15 DIAGNOSIS — D509 Iron deficiency anemia, unspecified: Secondary | ICD-10-CM | POA: Diagnosis not present

## 2015-01-15 DIAGNOSIS — N289 Disorder of kidney and ureter, unspecified: Secondary | ICD-10-CM | POA: Diagnosis not present

## 2015-01-15 DIAGNOSIS — D518 Other vitamin B12 deficiency anemias: Secondary | ICD-10-CM

## 2015-01-15 DIAGNOSIS — I10 Essential (primary) hypertension: Secondary | ICD-10-CM | POA: Diagnosis not present

## 2015-01-15 LAB — CBC WITH DIFFERENTIAL/PLATELET
BASOS ABS: 0.1 10*3/uL (ref 0.0–0.1)
Basophils Relative: 0.9 % (ref 0.0–3.0)
EOS ABS: 0.3 10*3/uL (ref 0.0–0.7)
Eosinophils Relative: 3.5 % (ref 0.0–5.0)
HCT: 34.9 % — ABNORMAL LOW (ref 36.0–46.0)
Hemoglobin: 12.1 g/dL (ref 12.0–15.0)
LYMPHS ABS: 1.7 10*3/uL (ref 0.7–4.0)
Lymphocytes Relative: 20.4 % (ref 12.0–46.0)
MCHC: 34.6 g/dL (ref 30.0–36.0)
MCV: 89.1 fl (ref 78.0–100.0)
MONO ABS: 0.6 10*3/uL (ref 0.1–1.0)
MONOS PCT: 6.6 % (ref 3.0–12.0)
NEUTROS PCT: 68.6 % (ref 43.0–77.0)
Neutro Abs: 5.9 10*3/uL (ref 1.4–7.7)
PLATELETS: 283 10*3/uL (ref 150.0–400.0)
RBC: 3.92 Mil/uL (ref 3.87–5.11)
RDW: 13.1 % (ref 11.5–15.5)
WBC: 8.5 10*3/uL (ref 4.0–10.5)

## 2015-01-15 LAB — COMPREHENSIVE METABOLIC PANEL
ALK PHOS: 47 U/L (ref 39–117)
ALT: 12 U/L (ref 0–35)
AST: 15 U/L (ref 0–37)
Albumin: 3.7 g/dL (ref 3.5–5.2)
BILIRUBIN TOTAL: 0.3 mg/dL (ref 0.2–1.2)
BUN: 15 mg/dL (ref 6–23)
CO2: 30 meq/L (ref 19–32)
Calcium: 8.9 mg/dL (ref 8.4–10.5)
Chloride: 104 mEq/L (ref 96–112)
Creatinine, Ser: 0.94 mg/dL (ref 0.40–1.20)
GFR: 62.11 mL/min (ref >60.00)
GLUCOSE: 105 mg/dL — AB (ref 70–99)
Potassium: 4.2 mEq/L (ref 3.5–5.1)
SODIUM: 139 meq/L (ref 135–145)
TOTAL PROTEIN: 6.3 g/dL (ref 6.0–8.3)

## 2015-01-15 LAB — LIPID PANEL
CHOL/HDL RATIO: 4
Cholesterol: 136 mg/dL (ref 0–200)
HDL: 37 mg/dL — ABNORMAL LOW (ref >39.00)
LDL CALC: 61 mg/dL (ref 0–99)
NonHDL: 99.08
Triglycerides: 191 mg/dL — ABNORMAL HIGH (ref 0.0–149.0)
VLDL: 38.2 mg/dL (ref 0.0–40.0)

## 2015-01-15 LAB — FERRITIN: Ferritin: 64.9 ng/mL (ref 10.0–291.0)

## 2015-01-15 LAB — HEMOGLOBIN A1C: Hgb A1c MFr Bld: 6.1 % (ref 4.6–6.5)

## 2015-01-15 LAB — TSH: TSH: 2.48 u[IU]/mL (ref 0.35–4.50)

## 2015-01-15 LAB — VITAMIN B12: VITAMIN B 12: 511 pg/mL (ref 211–911)

## 2015-02-01 ENCOUNTER — Encounter: Payer: Self-pay | Admitting: Family Medicine

## 2015-02-01 ENCOUNTER — Ambulatory Visit (INDEPENDENT_AMBULATORY_CARE_PROVIDER_SITE_OTHER): Payer: Medicare Other | Admitting: Family Medicine

## 2015-02-01 VITALS — BP 135/80 | HR 46 | Temp 98.8°F | Ht 63.5 in | Wt 219.2 lb

## 2015-02-01 DIAGNOSIS — Z23 Encounter for immunization: Secondary | ICD-10-CM | POA: Diagnosis not present

## 2015-02-01 DIAGNOSIS — E119 Type 2 diabetes mellitus without complications: Secondary | ICD-10-CM | POA: Diagnosis not present

## 2015-02-01 DIAGNOSIS — E785 Hyperlipidemia, unspecified: Secondary | ICD-10-CM | POA: Diagnosis not present

## 2015-02-01 DIAGNOSIS — I10 Essential (primary) hypertension: Secondary | ICD-10-CM | POA: Diagnosis not present

## 2015-02-01 NOTE — Patient Instructions (Signed)
Because you are having a new baby in the family I recommend you get a Tdap vaccine at the health dept. Get back on the omega 3  Blood pressure is better Labs are stable  Flu shot today  Follow up in about 6 months
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
## 2015-02-01 NOTE — Progress Notes (Signed)
Pre visit review using our clinic review tool, if applicable. No additional management support is needed unless otherwise documented below in the visit note. 

## 2015-02-03 NOTE — Assessment & Plan Note (Signed)
Lab Results  Component Value Date   HGBA1C 6.1 01/15/2015   Control remains good and stable  Rev diet  Enc exercise as tolerated

## 2015-02-03 NOTE — Assessment & Plan Note (Signed)
Improved bp in fair control at this time  BP Readings from Last 1 Encounters:  02/01/15 135/80   No changes needed Disc lifstyle change with low sodium diet and exercise   Continue to monitor  No change in tx

## 2015-02-03 NOTE — Assessment & Plan Note (Signed)
Disc goals for lipids and reasons to control them Rev labs with pt Rev low sat fat diet in detail Controlled with crestor  Since HDL is down - suggested she re start omega 3 Also enc exercise

## 2015-03-04 ENCOUNTER — Other Ambulatory Visit: Payer: Self-pay | Admitting: Family Medicine

## 2015-03-04 NOTE — Telephone Encounter (Signed)
Electronic refill request, last refilled on 01/10/15 #30g with 3 additional refills, please advise

## 2015-03-04 NOTE — Telephone Encounter (Signed)
Please refill 60 g (instead of 30) with one refill It sounds like she is using it regularly-please check in with her to see if it is helping

## 2015-03-05 NOTE — Telephone Encounter (Signed)
Rx sent and pt said the cream is working good, she just has a place "in between her fat rolls" that gets irritated often, but the cream helps clear up the area well

## 2015-03-07 DIAGNOSIS — H2513 Age-related nuclear cataract, bilateral: Secondary | ICD-10-CM | POA: Diagnosis not present

## 2015-03-07 DIAGNOSIS — H524 Presbyopia: Secondary | ICD-10-CM | POA: Diagnosis not present

## 2015-03-07 DIAGNOSIS — D3132 Benign neoplasm of left choroid: Secondary | ICD-10-CM | POA: Diagnosis not present

## 2015-03-07 DIAGNOSIS — E119 Type 2 diabetes mellitus without complications: Secondary | ICD-10-CM | POA: Diagnosis not present

## 2015-03-07 DIAGNOSIS — H11823 Conjunctivochalasis, bilateral: Secondary | ICD-10-CM | POA: Diagnosis not present

## 2015-03-07 DIAGNOSIS — H59811 Chorioretinal scars after surgery for detachment, right eye: Secondary | ICD-10-CM | POA: Diagnosis not present

## 2015-03-07 LAB — HM DIABETES EYE EXAM

## 2015-03-29 ENCOUNTER — Encounter: Payer: Self-pay | Admitting: Family Medicine

## 2015-04-04 ENCOUNTER — Other Ambulatory Visit: Payer: Self-pay | Admitting: *Deleted

## 2015-04-04 MED ORDER — CYCLOBENZAPRINE HCL 10 MG PO TABS: ORAL_TABLET | ORAL | Status: DC

## 2015-04-04 NOTE — Telephone Encounter (Signed)
Will refill electronically  

## 2015-04-04 NOTE — Telephone Encounter (Signed)
Fax refill request, pt has f/u scheduled for 08/02/15, last refilled on 01/09/14 #90 with 3 additional refills, please advise

## 2015-05-16 ENCOUNTER — Encounter: Payer: Self-pay | Admitting: *Deleted

## 2015-05-31 ENCOUNTER — Encounter: Payer: Self-pay | Admitting: Internal Medicine

## 2015-07-28 ENCOUNTER — Telehealth: Payer: Self-pay | Admitting: Family Medicine

## 2015-07-28 DIAGNOSIS — E119 Type 2 diabetes mellitus without complications: Secondary | ICD-10-CM

## 2015-07-28 DIAGNOSIS — E785 Hyperlipidemia, unspecified: Secondary | ICD-10-CM

## 2015-07-28 DIAGNOSIS — I10 Essential (primary) hypertension: Secondary | ICD-10-CM

## 2015-07-28 NOTE — Telephone Encounter (Signed)
-----   Message from Ellamae Sia sent at 07/24/2015  5:49 PM EST ----- Regarding: Lab orders for Monday, 3.13.17 Lab orders for a 6 month follow up appt

## 2015-07-29 ENCOUNTER — Other Ambulatory Visit (INDEPENDENT_AMBULATORY_CARE_PROVIDER_SITE_OTHER): Payer: Medicare Other

## 2015-07-29 DIAGNOSIS — E785 Hyperlipidemia, unspecified: Secondary | ICD-10-CM

## 2015-07-29 DIAGNOSIS — E119 Type 2 diabetes mellitus without complications: Secondary | ICD-10-CM | POA: Diagnosis not present

## 2015-07-29 DIAGNOSIS — I10 Essential (primary) hypertension: Secondary | ICD-10-CM

## 2015-07-29 LAB — COMPREHENSIVE METABOLIC PANEL
ALBUMIN: 4 g/dL (ref 3.5–5.2)
ALT: 12 U/L (ref 0–35)
AST: 15 U/L (ref 0–37)
Alkaline Phosphatase: 51 U/L (ref 39–117)
BILIRUBIN TOTAL: 0.3 mg/dL (ref 0.2–1.2)
BUN: 20 mg/dL (ref 6–23)
CHLORIDE: 103 meq/L (ref 96–112)
CO2: 29 meq/L (ref 19–32)
CREATININE: 1.04 mg/dL (ref 0.40–1.20)
Calcium: 9.3 mg/dL (ref 8.4–10.5)
GFR: 55.19 mL/min — AB (ref >60.00)
GLUCOSE: 115 mg/dL — AB (ref 70–99)
POTASSIUM: 4.4 meq/L (ref 3.5–5.1)
SODIUM: 138 meq/L (ref 135–145)
TOTAL PROTEIN: 6.8 g/dL (ref 6.0–8.3)

## 2015-07-29 LAB — LIPID PANEL
CHOL/HDL RATIO: 3
Cholesterol: 147 mg/dL (ref 0–200)
HDL: 49.1 mg/dL (ref >39.00)
NONHDL: 97.62
Triglycerides: 222 mg/dL — ABNORMAL HIGH (ref 0.0–149.0)
VLDL: 44.4 mg/dL — ABNORMAL HIGH (ref 0.0–40.0)

## 2015-07-29 LAB — LDL CHOLESTEROL, DIRECT: Direct LDL: 64 mg/dL

## 2015-07-29 LAB — HEMOGLOBIN A1C: Hgb A1c MFr Bld: 6.2 % (ref 4.6–6.5)

## 2015-08-02 ENCOUNTER — Ambulatory Visit (INDEPENDENT_AMBULATORY_CARE_PROVIDER_SITE_OTHER): Payer: Medicare Other | Admitting: Family Medicine

## 2015-08-02 ENCOUNTER — Encounter: Payer: Self-pay | Admitting: Family Medicine

## 2015-08-02 VITALS — BP 122/64 | HR 60 | Temp 99.2°F | Ht 63.5 in | Wt 219.2 lb

## 2015-08-02 DIAGNOSIS — J019 Acute sinusitis, unspecified: Secondary | ICD-10-CM | POA: Insufficient documentation

## 2015-08-02 DIAGNOSIS — N289 Disorder of kidney and ureter, unspecified: Secondary | ICD-10-CM | POA: Diagnosis not present

## 2015-08-02 DIAGNOSIS — E119 Type 2 diabetes mellitus without complications: Secondary | ICD-10-CM

## 2015-08-02 DIAGNOSIS — I10 Essential (primary) hypertension: Secondary | ICD-10-CM

## 2015-08-02 DIAGNOSIS — Z6836 Body mass index (BMI) 36.0-36.9, adult: Secondary | ICD-10-CM

## 2015-08-02 DIAGNOSIS — E6609 Other obesity due to excess calories: Secondary | ICD-10-CM | POA: Insufficient documentation

## 2015-08-02 DIAGNOSIS — J0100 Acute maxillary sinusitis, unspecified: Secondary | ICD-10-CM | POA: Diagnosis not present

## 2015-08-02 DIAGNOSIS — E785 Hyperlipidemia, unspecified: Secondary | ICD-10-CM

## 2015-08-02 DIAGNOSIS — Z6835 Body mass index (BMI) 35.0-35.9, adult: Secondary | ICD-10-CM | POA: Insufficient documentation

## 2015-08-02 DIAGNOSIS — E669 Obesity, unspecified: Secondary | ICD-10-CM

## 2015-08-02 MED ORDER — DOXYCYCLINE HYCLATE 100 MG PO TABS: 100.0000 mg | ORAL_TABLET | Freq: Two times a day (BID) | ORAL | Status: DC
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
## 2015-08-02 NOTE — Patient Instructions (Signed)
Chronic conditions are stable  Work on diet and exercise and weight loss  Take doxycycline for sinus infection  Start the mist version of flonase over the counter if the rhinocort continues to give sore throat (gargle after)  Start back on zyrtec 10 mg one daily  Follow up in 4-6 weeks for nasal symptoms and wheezing Use your inhaler for wheezing if you need

## 2015-08-02 NOTE — Progress Notes (Signed)
Pre visit review using our clinic review tool, if applicable. No additional management support is needed unless otherwise documented below in the visit note. 

## 2015-08-04 NOTE — Assessment & Plan Note (Signed)
Lab Results  Component Value Date   HGBA1C 6.2 07/29/2015   Overall stable  Stressed imp of DM diet/ exercise and wt loss  Continue to follow

## 2015-08-04 NOTE — Assessment & Plan Note (Signed)
Ongoing - with background allergies  Take doxycycline for sinus infection  Start the mist version of flonase over the counter if the rhinocort continues to give sore throat (gargle after)  Start back on zyrtec 10 mg one daily  Follow up in 4-6 weeks for nasal symptoms and wheezing Use your inhaler for wheezing if you need

## 2015-08-04 NOTE — Assessment & Plan Note (Signed)
Disc goals for lipids and reasons to control them Rev labs with pt Rev low sat fat diet in detail  HDL is improved  Continue crestor

## 2015-08-04 NOTE — Assessment & Plan Note (Signed)
Overall stable  Enc good water intake

## 2015-08-04 NOTE — Assessment & Plan Note (Signed)
bp in fair control at this time  BP Readings from Last 1 Encounters:  08/02/15 122/64   No changes needed Disc lifstyle change with low sodium diet and exercise  Labs reviewed

## 2015-08-04 NOTE — Assessment & Plan Note (Signed)
Discussed how this problem influences overall health and the risks it imposes  Reviewed plan for weight loss with lower calorie diet (via better food choices and also portion control or program like weight watchers) and exercise building up to or more than 30 minutes 5 days per week including some aerobic activity   Rev with family also  Pt is not motivated for change at this time

## 2015-08-13 ENCOUNTER — Telehealth: Payer: Self-pay | Admitting: Family Medicine

## 2015-08-13 NOTE — Telephone Encounter (Signed)
LM for pt to sch AWV with Katha Cabal, [place]

## 2015-10-02 ENCOUNTER — Encounter: Payer: Self-pay | Admitting: Family Medicine

## 2015-10-02 ENCOUNTER — Ambulatory Visit (INDEPENDENT_AMBULATORY_CARE_PROVIDER_SITE_OTHER)
Admission: RE | Admit: 2015-10-02 | Discharge: 2015-10-02 | Disposition: A | Discharge status: Home / Self Care | Payer: Medicare Other | Source: Ambulatory Visit | Attending: Family Medicine | Admitting: Family Medicine

## 2015-10-02 ENCOUNTER — Ambulatory Visit (INDEPENDENT_AMBULATORY_CARE_PROVIDER_SITE_OTHER): Payer: Medicare Other | Admitting: Family Medicine

## 2015-10-02 VITALS — BP 160/64 | HR 68 | Temp 99.0°F | Ht 63.5 in | Wt 222.5 lb

## 2015-10-02 DIAGNOSIS — M19049 Primary osteoarthritis, unspecified hand: Secondary | ICD-10-CM

## 2015-10-02 DIAGNOSIS — M25462 Effusion, left knee: Secondary | ICD-10-CM | POA: Diagnosis not present

## 2015-10-02 DIAGNOSIS — M1712 Unilateral primary osteoarthritis, left knee: Secondary | ICD-10-CM

## 2015-10-02 DIAGNOSIS — M25562 Pain in left knee: Secondary | ICD-10-CM

## 2015-10-02 MED ORDER — METHYLPREDNISOLONE ACETATE 40 MG/ML IJ SUSP
80.0000 mg | Freq: Once | INTRAMUSCULAR | Status: AC
  Administered 2015-10-02: 80 mg via INTRA_ARTICULAR

## 2015-10-02 NOTE — Progress Notes (Signed)
Pre visit review using our clinic review tool, if applicable. No additional management support is needed unless otherwise documented below in the visit note. 
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
## 2015-10-28 ENCOUNTER — Encounter: Payer: Self-pay | Admitting: Family Medicine

## 2015-10-28 ENCOUNTER — Ambulatory Visit (INDEPENDENT_AMBULATORY_CARE_PROVIDER_SITE_OTHER): Payer: Medicare Other | Admitting: Family Medicine

## 2015-10-28 VITALS — BP 130/60 | HR 51 | Temp 98.9°F | Ht 63.5 in | Wt 219.5 lb

## 2015-10-28 DIAGNOSIS — M25562 Pain in left knee: Secondary | ICD-10-CM

## 2015-10-28 MED ORDER — DIAZEPAM 5 MG PO TABS: ORAL_TABLET | ORAL | Status: DC

## 2015-10-28 NOTE — Progress Notes (Signed)
Pre visit review using our clinic review tool, if applicable. No additional management support is needed unless otherwise documented below in the visit note. 
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
## 2015-11-06 ENCOUNTER — Other Ambulatory Visit: Payer: Medicare Other

## 2015-11-06 ENCOUNTER — Ambulatory Visit
Admission: RE | Admit: 2015-11-06 | Discharge: 2015-11-06 | Disposition: A | Discharge status: Home / Self Care | Payer: Medicare Other | Source: Ambulatory Visit | Attending: Family Medicine | Admitting: Family Medicine

## 2015-11-06 DIAGNOSIS — S83242A Other tear of medial meniscus, current injury, left knee, initial encounter: Secondary | ICD-10-CM | POA: Diagnosis not present

## 2015-11-06 DIAGNOSIS — M25562 Pain in left knee: Secondary | ICD-10-CM

## 2015-11-11 ENCOUNTER — Telehealth: Payer: Self-pay | Admitting: Family Medicine

## 2015-11-11 ENCOUNTER — Other Ambulatory Visit: Payer: Self-pay | Admitting: Family Medicine

## 2015-11-11 DIAGNOSIS — S83242A Other tear of medial meniscus, current injury, left knee, initial encounter: Secondary | ICD-10-CM

## 2015-11-11 DIAGNOSIS — M1712 Unilateral primary osteoarthritis, left knee: Secondary | ICD-10-CM

## 2015-11-11 MED ORDER — TRAMADOL HCL 50 MG PO TABS: 50.0000 mg | ORAL_TABLET | Freq: Four times a day (QID) | ORAL | Status: DC | PRN

## 2015-11-11 NOTE — Telephone Encounter (Signed)
Tramadol 50 mg, 1 po q 6 hours prn pain, #50, 0 refills   Ok to take with tylenol or anti-inflammatories.

## 2015-11-11 NOTE — Telephone Encounter (Signed)
Please see my note that I sent her - it is possible that she did not read her MyChart message. If not, please give it to her over the phone.   It is pretty clear. If she has an additional question, let me know.

## 2015-11-11 NOTE — Telephone Encounter (Signed)
Pt called, wanting information form her MRI. I spoke to cma DL and she said to send message to provider.  Please call pt back regarding mri, thank you  Melissa

## 2015-11-11 NOTE — Telephone Encounter (Signed)
MRI results discussed with patient over the phone.  She would like to be referred back to Dr. Rush Farmer.  She is also requesting something for pain if it is going to take a while to get in with the orthopedic.

## 2015-11-11 NOTE — Telephone Encounter (Signed)
Tramadol called into [place].  Ms. Ogorek notified as instructed by telephone.

## 2015-11-13 DIAGNOSIS — S83232A Complex tear of medial meniscus, current injury, left knee, initial encounter: Secondary | ICD-10-CM | POA: Diagnosis not present

## 2015-11-13 DIAGNOSIS — M25562 Pain in left knee: Secondary | ICD-10-CM | POA: Diagnosis not present

## 2015-11-16 HISTORY — PX: KNEE SURGERY: SHX244

## 2015-11-28 DIAGNOSIS — M23322 Other meniscus derangements, posterior horn of medial meniscus, left knee: Secondary | ICD-10-CM | POA: Diagnosis not present

## 2015-11-28 DIAGNOSIS — M94262 Chondromalacia, left knee: Secondary | ICD-10-CM | POA: Diagnosis not present

## 2015-11-28 DIAGNOSIS — Y999 Unspecified external cause status: Secondary | ICD-10-CM | POA: Diagnosis not present

## 2015-11-28 DIAGNOSIS — S83232A Complex tear of medial meniscus, current injury, left knee, initial encounter: Secondary | ICD-10-CM | POA: Diagnosis not present

## 2015-11-28 DIAGNOSIS — G8918 Other acute postprocedural pain: Secondary | ICD-10-CM | POA: Diagnosis not present

## 2015-12-11 ENCOUNTER — Telehealth: Payer: Self-pay | Admitting: *Deleted

## 2015-12-11 NOTE — Telephone Encounter (Signed)
Colletta [place] with [facility] Medicare left v/m;amlodipine-benazepril  Has been denied; letter mailed to provider with details of denial and appeal info. The combination drug is not formulary. But separately amlodipine and benazepril are on formulary.

## 2015-12-11 NOTE — Telephone Encounter (Signed)
PA done for pt's amlodipine-benazepril at [email or web address], I will await response

## 2015-12-12 MED ORDER — AMLODIPINE BESYLATE 10 MG PO TABS: 10.0000 mg | ORAL_TABLET | Freq: Every day | ORAL | 11 refills | Status: DC

## 2015-12-12 MED ORDER — BENAZEPRIL HCL 20 MG PO TABS: 20.0000 mg | ORAL_TABLET | Freq: Every day | ORAL | 11 refills | Status: DC

## 2015-12-12 NOTE — Telephone Encounter (Signed)
Done, thanks

## 2015-12-12 NOTE — Telephone Encounter (Signed)
Please ask pt if she wants me to send in the separate medicines and what pharmacy Thanks

## 2015-12-12 NOTE — Telephone Encounter (Signed)
Pt notified of PA denial and she is okay with taking 2 separate pills of this med, please send new Rx to [facility]

## 2016-01-08 ENCOUNTER — Telehealth: Payer: Self-pay | Admitting: Family Medicine

## 2016-01-08 NOTE — Telephone Encounter (Signed)
Spoke with pt  She will call back Monday to schedule medicare wellness

## 2016-01-15 ENCOUNTER — Other Ambulatory Visit: Payer: Self-pay | Admitting: Family Medicine

## 2016-02-03 ENCOUNTER — Ambulatory Visit (INDEPENDENT_AMBULATORY_CARE_PROVIDER_SITE_OTHER): Payer: Medicare Other

## 2016-02-03 VITALS — BP 134/60 | HR 54 | Temp 99.2°F | Ht 63.5 in | Wt 213.0 lb

## 2016-02-03 DIAGNOSIS — E785 Hyperlipidemia, unspecified: Secondary | ICD-10-CM | POA: Diagnosis not present

## 2016-02-03 DIAGNOSIS — E1169 Type 2 diabetes mellitus with other specified complication: Secondary | ICD-10-CM

## 2016-02-03 DIAGNOSIS — E538 Deficiency of other specified B group vitamins: Secondary | ICD-10-CM

## 2016-02-03 DIAGNOSIS — I10 Essential (primary) hypertension: Secondary | ICD-10-CM

## 2016-02-03 DIAGNOSIS — Z23 Encounter for immunization: Secondary | ICD-10-CM | POA: Diagnosis not present

## 2016-02-03 DIAGNOSIS — Z0000 Encounter for general adult medical examination without abnormal findings: Secondary | ICD-10-CM | POA: Diagnosis not present

## 2016-02-03 DIAGNOSIS — D509 Iron deficiency anemia, unspecified: Secondary | ICD-10-CM

## 2016-02-03 DIAGNOSIS — E669 Obesity, unspecified: Secondary | ICD-10-CM | POA: Diagnosis not present

## 2016-02-03 DIAGNOSIS — E119 Type 2 diabetes mellitus without complications: Secondary | ICD-10-CM

## 2016-02-03 LAB — CBC WITH DIFFERENTIAL/PLATELET
BASOS ABS: 0 10*3/uL (ref 0.0–0.1)
BASOS PCT: 0.4 % (ref 0.0–3.0)
EOS ABS: 0.4 10*3/uL (ref 0.0–0.7)
Eosinophils Relative: 4.1 % (ref 0.0–5.0)
HCT: 33.6 % — ABNORMAL LOW (ref 36.0–46.0)
HEMOGLOBIN: 11.4 g/dL — AB (ref 12.0–15.0)
Lymphocytes Relative: 20.6 % (ref 12.0–46.0)
Lymphs Abs: 2.2 10*3/uL (ref 0.7–4.0)
MCHC: 33.9 g/dL (ref 30.0–36.0)
MCV: 86.8 fl (ref 78.0–100.0)
MONO ABS: 0.8 10*3/uL (ref 0.1–1.0)
Monocytes Relative: 7.7 % (ref 3.0–12.0)
Neutro Abs: 7.2 10*3/uL (ref 1.4–7.7)
Neutrophils Relative %: 67.2 % (ref 43.0–77.0)
Platelets: 340 10*3/uL (ref 150.0–400.0)
RBC: 3.87 Mil/uL (ref 3.87–5.11)
RDW: 13.6 % (ref 11.5–15.5)
WBC: 10.7 10*3/uL — AB (ref 4.0–10.5)

## 2016-02-03 LAB — LDL CHOLESTEROL, DIRECT: LDL DIRECT: 68 mg/dL

## 2016-02-03 LAB — HEMOGLOBIN A1C: HEMOGLOBIN A1C: 5.9 % (ref 4.6–6.5)

## 2016-02-03 LAB — TSH: TSH: 1.73 u[IU]/mL (ref 0.35–4.50)

## 2016-02-03 LAB — VITAMIN B12: VITAMIN B 12: 414 pg/mL (ref 211–911)

## 2016-02-03 LAB — FERRITIN: FERRITIN: 38.6 ng/mL (ref 10.0–291.0)
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
## 2016-02-03 NOTE — Patient Instructions (Signed)
Ann Cummings , Thank you for taking time to come for your Medicare Wellness Visit. I appreciate your ongoing commitment to your health goals. Please review the following plan we discussed and let me know if I can assist you in the future.   These are the goals we discussed: Goals    . Increase physical activity          Starting 02/03/2016, I will continue to do leg lifts 3 times daily and walk to mailbox daily.        This is a list of the screening recommended for you and due dates:  Health Maintenance  Topic Date Due  . Shingles Vaccine  02/02/2017*  . Eye exam for diabetics  03/06/2016  . Complete foot exam   08/01/2016  . Hemoglobin A1C  08/02/2016  . Mammogram  08/15/2016  . Tetanus Vaccine  10/13/2016  . Colon Cancer Screening  04/24/2019  . Flu Shot  Completed  . DEXA scan (bone density measurement)  Completed  . Pneumonia vaccines  Completed  *Topic was postponed. The date shown is not the original due date.   Preventive Care for Adults  A healthy lifestyle and preventive care can promote health and wellness. Preventive health guidelines for adults include the following key practices.  . A routine yearly physical is a good way to check with your health care provider about your health and preventive screening. It is a chance to share any concerns and updates on your health and to receive a thorough exam.  . Visit your dentist for a routine exam and preventive care every 6 months. Brush your teeth twice a day and floss once a day. Good oral hygiene prevents tooth decay and gum disease.  . The frequency of eye exams is based on your age, health, family medical history, use  of contact lenses, and other factors. Follow your health care provider's ecommendations for frequency of eye exams.  . Eat a healthy diet. Foods like vegetables, fruits, whole grains, low-fat dairy products, and lean protein foods contain the nutrients you need without too many calories. Decrease your  intake of foods high in solid fats, added sugars, and salt. Eat the right amount of calories for you. Get information about a proper diet from your health care provider, if necessary.  . Regular physical exercise is one of the most important things you can do for your health. Most adults should get at least 150 minutes of moderate-intensity exercise (any activity that increases your heart rate and causes you to sweat) each week. In addition, most adults need muscle-strengthening exercises on 2 or more days a week.  Silver Sneakers may be a benefit available to you. To determine eligibility, you may visit the website: www.silversneakers.com or contact program at (650) 421-3958 Mon-Fri between 8AM-8PM.   . Maintain a healthy weight. The body mass index (BMI) is a screening tool to identify possible weight problems. It provides an estimate of body fat based on height and weight. Your health care provider can find your BMI and can help you achieve or maintain a healthy weight.   For adults 20 years and older: ? A BMI below 18.5 is considered underweight. ? A BMI of 18.5 to 24.9 is normal. ? A BMI of 25 to 29.9 is considered overweight. ? A BMI of 30 and above is considered obese.   . Maintain normal blood lipids and cholesterol levels by exercising and minimizing your intake of saturated fat. Eat a balanced diet with plenty  of fruit and vegetables. Blood tests for lipids and cholesterol should begin at age 1 and be repeated every 5 years. If your lipid or cholesterol levels are high, you are over 50, or you are at high risk for heart disease, you may need your cholesterol levels checked more frequently. Ongoing high lipid and cholesterol levels should be treated with medicines if diet and exercise are not working.  . If you smoke, find out from your health care provider how to quit. If you do not use tobacco, please do not start.  . If you choose to drink alcohol, please do not consume more than 2  drinks per day. One drink is considered to be 12 ounces (355 mL) of beer, 5 ounces (148 mL) of wine, or 1.5 ounces (44 mL) of liquor.  . If you are 11-84 years old, ask your health care provider if you should take aspirin to prevent strokes.  . Use sunscreen. Apply sunscreen liberally and repeatedly throughout the day. You should seek shade when your shadow is shorter than you. Protect yourself by wearing long sleeves, pants, a wide-brimmed hat, and sunglasses year round, whenever you are outdoors.  . Once a month, do a whole body skin exam, using a mirror to look at the skin on your back. Tell your health care provider of new moles, moles that have irregular borders, moles that are larger than a pencil eraser, or moles that have changed in shape or color.

## 2016-02-03 NOTE — Progress Notes (Signed)
PCP notes:   Health maintenance:  Shingles - postponed/insurance A1C - done Flu vaccine - administered  Abnormal screenings:   Depression score: 9 Hearing - failed  Patient concerns:   None  Nurse concerns:  None  Next PCP appt:   02/10/2016 @ 1400  I reviewed health advisor's note, was available for consultation, and agree with documentation and plan.  Loura Pardon MD

## 2016-02-03 NOTE — Progress Notes (Signed)
Pre visit review using our clinic review tool, if applicable. No additional management support is needed unless otherwise documented below in the visit note. 

## 2016-02-10 ENCOUNTER — Ambulatory Visit (INDEPENDENT_AMBULATORY_CARE_PROVIDER_SITE_OTHER): Payer: Medicare Other | Admitting: Family Medicine

## 2016-02-10 ENCOUNTER — Encounter: Payer: Self-pay | Admitting: Family Medicine

## 2016-02-10 VITALS — BP 136/64 | HR 55 | Temp 98.6°F | Ht 63.5 in | Wt 214.8 lb

## 2016-02-10 DIAGNOSIS — F329 Major depressive disorder, single episode, unspecified: Secondary | ICD-10-CM

## 2016-02-10 DIAGNOSIS — E669 Obesity, unspecified: Secondary | ICD-10-CM

## 2016-02-10 DIAGNOSIS — E785 Hyperlipidemia, unspecified: Secondary | ICD-10-CM

## 2016-02-10 DIAGNOSIS — D518 Other vitamin B12 deficiency anemias: Secondary | ICD-10-CM

## 2016-02-10 DIAGNOSIS — F32A Depression, unspecified: Secondary | ICD-10-CM

## 2016-02-10 DIAGNOSIS — D649 Anemia, unspecified: Secondary | ICD-10-CM

## 2016-02-10 DIAGNOSIS — I10 Essential (primary) hypertension: Secondary | ICD-10-CM | POA: Diagnosis not present

## 2016-02-10 DIAGNOSIS — E119 Type 2 diabetes mellitus without complications: Secondary | ICD-10-CM | POA: Diagnosis not present

## 2016-02-10 MED ORDER — ROSUVASTATIN CALCIUM 10 MG PO TABS: 5.0000 mg | ORAL_TABLET | Freq: Every day | ORAL | 11 refills | Status: DC

## 2016-02-10 MED ORDER — OMEPRAZOLE 20 MG PO CPDR: 20.0000 mg | DELAYED_RELEASE_CAPSULE | Freq: Every day | ORAL | 11 refills | Status: DC

## 2016-02-10 MED ORDER — FUROSEMIDE 20 MG PO TABS: 20.0000 mg | ORAL_TABLET | Freq: Every day | ORAL | 11 refills | Status: DC

## 2016-02-10 MED ORDER — CLOTRIMAZOLE-BETAMETHASONE 1-0.05 % EX CREA: TOPICAL_CREAM | CUTANEOUS | 1 refills | Status: DC

## 2016-02-10 NOTE — Assessment & Plan Note (Signed)
Disc goals for lipids and reasons to control them Rev labs with pt Rev low sat fat diet in detail Stable and well controlled with crestor and diet

## 2016-02-10 NOTE — Assessment & Plan Note (Signed)
Discussed how this problem influences overall health and the risks it imposes  Reviewed plan for weight loss with lower calorie diet (via better food choices and also portion control or program like weight watchers) and exercise building up to or more than 30 minutes 5 days per week including some aerobic activity    

## 2016-02-10 NOTE — Assessment & Plan Note (Signed)
Well controlled currently with diet  Enc her to add more exercise when cleared from knee surgery  Lab Results  Component Value Date   HGBA1C 5.9 02/03/2016   F/u 6 mo  Enc low glycemic diet and wt loss
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
## 2016-02-10 NOTE — Assessment & Plan Note (Signed)
Hb is down slightly -suspect from recent surgery  Will continue to watch

## 2016-02-10 NOTE — Patient Instructions (Addendum)
Your blood pressure here is lower than at home so the next time bring it with you and we will test it  In the meantime -don't check it if you do not want to  Don't forget to make your mammogram appt at the [facility]  Get outdoors more to help mood and physical health  Get back on your calcium and vit D   Follow up in 6 months

## 2016-02-10 NOTE — Assessment & Plan Note (Signed)
Rev her depression screen  Pt assures me that mood is stable -has good and bad days  She is on celexa -wants to continue that  Enc exercise and outdoor time

## 2016-02-10 NOTE — Progress Notes (Signed)
Pre visit review using our clinic review tool, if applicable. No additional management support is needed unless otherwise documented below in the visit note. 

## 2016-02-10 NOTE — Assessment & Plan Note (Signed)
bp in fair control at this time  BP Readings from Last 1 Encounters:  02/10/16 136/64   No changes needed Disc lifstyle change with low sodium diet and exercise  Labs reviewed  Wt loss enc

## 2016-02-10 NOTE — Assessment & Plan Note (Signed)
Controlled with current supplementation Lab Results  Component Value Date   VITAMINB12 414 02/03/2016

## 2016-02-12 ENCOUNTER — Telehealth: Payer: Self-pay | Admitting: *Deleted

## 2016-02-12 NOTE — Telephone Encounter (Signed)
I cannot find answers to questions in the chart-please call her and ask what problem and what areas she is using it for-and how long  Also any other medicines that have been tried but did not work   Since this is a combo med of 2 things-that may be why they are trying to deny it   I will put form in IN box

## 2016-02-12 NOTE — Telephone Encounter (Signed)
PA form received for pt's lotrisone cream, form placed in your inbox

## 2016-02-19 ENCOUNTER — Encounter: Payer: Self-pay | Admitting: Family Medicine

## 2016-02-19 DIAGNOSIS — L304 Erythema intertrigo: Secondary | ICD-10-CM | POA: Insufficient documentation

## 2016-02-19 NOTE — Telephone Encounter (Signed)
Spoke with pt and she said she has been using this medication off and on for about 7 yrs. Pt said she uses for itching/ rash on her bottom area. Pt said she has been using med for so long she isn't sure what other meds she has tried because this one works so well, form back in your inbox to finish completing the best you can

## 2016-02-19 NOTE — Telephone Encounter (Signed)
PA faxed

## 2016-02-19 NOTE — Telephone Encounter (Signed)
No -most likely candidal intertrigo

## 2016-02-19 NOTE — Telephone Encounter (Signed)
Done and in IN box 

## 2016-02-19 NOTE — Telephone Encounter (Signed)
[employer] left v/m requesting additional info; does pt have tinea infection. Request cb.

## 2016-02-20 NOTE — Telephone Encounter (Signed)
Ann Cummings with [facility] [employer] called to let us know that her PA for the clotrimazole-betamethasone cream was denied due to it being used for non FDA approved diagnosis, they will fax over the denial letter

## 2016-02-20 NOTE — Telephone Encounter (Signed)
Please let pt know it was denied  If she wants to try something else please call in nystatin cream apply to affected area bid prn #30 g (or whatever a medium size tube is) 3 ref

## 2016-02-20 NOTE — Telephone Encounter (Signed)
Spoke with Juliann Pulse at [facility] and advise her of Dr. Marliss Coots comments. They will relay the message and process pt's PA

## 2016-02-21 NOTE — Telephone Encounter (Signed)
Left voicemail requesting pt to call the office back 

## 2016-02-24 ENCOUNTER — Other Ambulatory Visit: Payer: Self-pay | Admitting: Family Medicine

## 2016-03-13 NOTE — Telephone Encounter (Signed)
Left voicemail requesting pt to call office back 

## 2016-03-17 ENCOUNTER — Other Ambulatory Visit: Payer: Self-pay | Admitting: Family Medicine

## 2016-04-19 ENCOUNTER — Other Ambulatory Visit: Payer: Self-pay | Admitting: Family Medicine

## 2016-04-20 DIAGNOSIS — H35412 Lattice degeneration of retina, left eye: Secondary | ICD-10-CM | POA: Diagnosis not present

## 2016-04-20 DIAGNOSIS — H43812 Vitreous degeneration, left eye: Secondary | ICD-10-CM | POA: Diagnosis not present

## 2016-05-04 DIAGNOSIS — H43812 Vitreous degeneration, left eye: Secondary | ICD-10-CM | POA: Diagnosis not present

## 2016-05-04 DIAGNOSIS — H35412 Lattice degeneration of retina, left eye: Secondary | ICD-10-CM | POA: Diagnosis not present

## 2016-05-14 LAB — HM DIABETES EYE EXAM

## 2016-05-22 ENCOUNTER — Other Ambulatory Visit: Payer: Self-pay | Admitting: Family Medicine

## 2016-05-22 NOTE — Telephone Encounter (Signed)
Next appt on 08/12/16, last filled was 04/04/15 #90 with 3 additional refill, please advise

## 2016-05-22 NOTE — Telephone Encounter (Signed)
Will refill electronically  

## 2016-07-22 ENCOUNTER — Other Ambulatory Visit: Payer: Self-pay | Admitting: Family Medicine

## 2016-08-12 ENCOUNTER — Ambulatory Visit (INDEPENDENT_AMBULATORY_CARE_PROVIDER_SITE_OTHER): Payer: Medicare Other | Admitting: Family Medicine

## 2016-08-12 ENCOUNTER — Encounter: Payer: Self-pay | Admitting: Family Medicine

## 2016-08-12 VITALS — BP 130/62 | HR 53 | Temp 98.9°F | Ht 63.5 in | Wt 213.5 lb

## 2016-08-12 DIAGNOSIS — E119 Type 2 diabetes mellitus without complications: Secondary | ICD-10-CM | POA: Diagnosis not present

## 2016-08-12 DIAGNOSIS — Z6837 Body mass index (BMI) 37.0-37.9, adult: Secondary | ICD-10-CM | POA: Diagnosis not present

## 2016-08-12 DIAGNOSIS — J301 Allergic rhinitis due to pollen: Secondary | ICD-10-CM | POA: Diagnosis not present

## 2016-08-12 DIAGNOSIS — F3289 Other specified depressive episodes: Secondary | ICD-10-CM

## 2016-08-12 DIAGNOSIS — L309 Dermatitis, unspecified: Secondary | ICD-10-CM | POA: Diagnosis not present

## 2016-08-12 DIAGNOSIS — I10 Essential (primary) hypertension: Secondary | ICD-10-CM | POA: Diagnosis not present

## 2016-08-12 DIAGNOSIS — IMO0001 Reserved for inherently not codable concepts without codable children: Secondary | ICD-10-CM

## 2016-08-12 DIAGNOSIS — D508 Other iron deficiency anemias: Secondary | ICD-10-CM

## 2016-08-12 DIAGNOSIS — E6609 Other obesity due to excess calories: Secondary | ICD-10-CM | POA: Diagnosis not present

## 2016-08-12 DIAGNOSIS — E7800 Pure hypercholesterolemia, unspecified: Secondary | ICD-10-CM

## 2016-08-12 DIAGNOSIS — D518 Other vitamin B12 deficiency anemias: Secondary | ICD-10-CM

## 2016-08-12 LAB — LIPID PANEL
Cholesterol: 143 mg/dL (ref 0–200)
HDL: 41.9 mg/dL (ref >39.00)
NonHDL: 101.43
Total CHOL/HDL Ratio: 3
Triglycerides: 235 mg/dL — ABNORMAL HIGH (ref 0.0–149.0)
VLDL: 47 mg/dL — ABNORMAL HIGH (ref 0.0–40.0)

## 2016-08-12 LAB — FERRITIN: Ferritin: 40.2 ng/mL (ref 10.0–291.0)

## 2016-08-12 LAB — COMPREHENSIVE METABOLIC PANEL
ALT: 9 U/L (ref 0–35)
AST: 13 U/L (ref 0–37)
Albumin: 3.9 g/dL (ref 3.5–5.2)
Alkaline Phosphatase: 54 U/L (ref 39–117)
BILIRUBIN TOTAL: 0.4 mg/dL (ref 0.2–1.2)
BUN: 17 mg/dL (ref 6–23)
CO2: 29 mEq/L (ref 19–32)
CREATININE: 1.24 mg/dL — AB (ref 0.40–1.20)
Calcium: 9.3 mg/dL (ref 8.4–10.5)
Chloride: 103 mEq/L (ref 96–112)
GFR: 44.92 mL/min — ABNORMAL LOW (ref >60.00)
GLUCOSE: 101 mg/dL — AB (ref 70–99)
Potassium: 4.9 mEq/L (ref 3.5–5.1)
Sodium: 138 mEq/L (ref 135–145)
TOTAL PROTEIN: 6.1 g/dL (ref 6.0–8.3)

## 2016-08-12 LAB — CBC WITH DIFFERENTIAL/PLATELET
BASOS ABS: 0.1 10*3/uL (ref 0.0–0.1)
BASOS PCT: 0.6 % (ref 0.0–3.0)
EOS PCT: 2.5 % (ref 0.0–5.0)
Eosinophils Absolute: 0.3 10*3/uL (ref 0.0–0.7)
HEMATOCRIT: 33.2 % — AB (ref 36.0–46.0)
Hemoglobin: 11 g/dL — ABNORMAL LOW (ref 12.0–15.0)
Lymphocytes Relative: 17.7 % (ref 12.0–46.0)
Lymphs Abs: 2.2 10*3/uL (ref 0.7–4.0)
MCHC: 33.2 g/dL (ref 30.0–36.0)
MCV: 88.1 fl (ref 78.0–100.0)
MONOS PCT: 7.5 % (ref 3.0–12.0)
Monocytes Absolute: 0.9 10*3/uL (ref 0.1–1.0)
NEUTROS ABS: 8.8 10*3/uL — AB (ref 1.4–7.7)
Neutrophils Relative %: 71.7 % (ref 43.0–77.0)
PLATELETS: 361 10*3/uL (ref 150.0–400.0)
RBC: 3.77 Mil/uL — ABNORMAL LOW (ref 3.87–5.11)
RDW: 13.4 % (ref 11.5–15.5)
WBC: 12.3 10*3/uL — ABNORMAL HIGH (ref 4.0–10.5)

## 2016-08-12 LAB — LDL CHOLESTEROL, DIRECT: Direct LDL: 61 mg/dL

## 2016-08-12 LAB — TSH: TSH: 2.11 u[IU]/mL (ref 0.35–4.50)

## 2016-08-12 LAB — HEMOGLOBIN A1C: HEMOGLOBIN A1C: 6.1 % (ref 4.6–6.5)

## 2016-08-12 LAB — VITAMIN B12
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
## 2016-08-12 NOTE — Patient Instructions (Addendum)
Switch to a medicated shampoo  (selsun blue)  Get back on zyrtec 10 mg daily  Get back on nasacort nasal spray- bend forward and tilt head down when you sniff it   Try to get most of your carbohydrates from produce  Eat lean protein -every meal needs protein  Drink lots of water   Lack of exercise makes you fatigued -work on this  Keep doing your knee physical therapy   Labs today for fatigue and chronic health problems

## 2016-08-12 NOTE — Progress Notes (Signed)
Pre visit review using our clinic review tool, if applicable. No additional management support is needed unless otherwise documented below in the visit note. 

## 2016-08-13 NOTE — Assessment & Plan Note (Signed)
Urged to continue non scented moisturizers and avoid hot water

## 2016-08-13 NOTE — Assessment & Plan Note (Signed)
Likely needs to be on an antihistamine and steroid nasal spray for the season  Will update if no improvement

## 2016-08-13 NOTE — Assessment & Plan Note (Signed)
bp in fair control at this time  BP Readings from Last 1 Encounters:  08/12/16 130/62   No changes needed Disc lifstyle change with low sodium diet and exercise  Labs today

## 2016-08-13 NOTE — Assessment & Plan Note (Signed)
Due for labs Not eating as well since the holidays A1C may be up  Disc eye and foot care

## 2016-08-13 NOTE — Assessment & Plan Note (Signed)
Discussed how this problem influences overall health and the risks it imposes  Reviewed plan for weight loss with lower calorie diet (via better food choices and also portion control or program like weight watchers) and exercise building up to or more than 30 minutes 5 days per week including some aerobic activity   Pt is not very motivated

## 2016-08-13 NOTE — Assessment & Plan Note (Signed)
She takes 3000 mcg daily otc  B12 level today  Enc balanced diet  More fatigued

## 2016-08-13 NOTE — Assessment & Plan Note (Signed)
Doing well with celexa overall  Reviewed stressors/ coping techniques/symptoms/ support sources/ tx options and side effects in detail today

## 2016-08-13 NOTE — Assessment & Plan Note (Signed)
Pt had IV iron (ordered by GI) in the past  More fatigued lately  Unsure if she can tolerate oral iron  Labs today

## 2016-08-13 NOTE — Assessment & Plan Note (Signed)
Lab today  crestor and fair diet Disc goals for diabetic with hyperlipidemia

## 2016-08-25 ENCOUNTER — Other Ambulatory Visit: Payer: Self-pay | Admitting: Family Medicine

## 2016-09-01 ENCOUNTER — Other Ambulatory Visit: Payer: Self-pay | Admitting: Family Medicine

## 2016-09-01 DIAGNOSIS — D649 Anemia, unspecified: Secondary | ICD-10-CM

## 2016-09-01 DIAGNOSIS — D72829 Elevated white blood cell count, unspecified: Secondary | ICD-10-CM

## 2016-09-01 DIAGNOSIS — D508 Other iron deficiency anemias: Secondary | ICD-10-CM

## 2016-09-15 ENCOUNTER — Other Ambulatory Visit (INDEPENDENT_AMBULATORY_CARE_PROVIDER_SITE_OTHER): Payer: Medicare Other | Admitting: *Deleted

## 2016-09-15 DIAGNOSIS — D508 Other iron deficiency anemias: Secondary | ICD-10-CM | POA: Diagnosis not present

## 2016-09-15 DIAGNOSIS — D649 Anemia, unspecified: Secondary | ICD-10-CM | POA: Diagnosis not present

## 2016-09-15 DIAGNOSIS — D72829 Elevated white blood cell count, unspecified: Secondary | ICD-10-CM

## 2016-09-15 LAB — URINALYSIS, ROUTINE W REFLEX MICROSCOPIC
HGB URINE DIPSTICK: NEGATIVE
KETONES UR: NEGATIVE
Nitrite: NEGATIVE
Specific Gravity, Urine: 1.025 (ref 1.000–1.030)
TOTAL PROTEIN, URINE-UPE24: 100 — AB
URINE GLUCOSE: NEGATIVE
Urobilinogen, UA: 0.2 (ref 0.0–1.0)
pH: 5.5 (ref 5.0–8.0)

## 2016-09-15 LAB — CBC WITH DIFFERENTIAL/PLATELET
BASOS PCT: 0.3 % (ref 0.0–3.0)
Basophils Absolute: 0 10*3/uL (ref 0.0–0.1)
Eosinophils Absolute: 0.3 10*3/uL (ref 0.0–0.7)
Eosinophils Relative: 2.5 % (ref 0.0–5.0)
HEMATOCRIT: 32.4 % — AB (ref 36.0–46.0)
Hemoglobin: 11 g/dL — ABNORMAL LOW (ref 12.0–15.0)
LYMPHS PCT: 19 % (ref 12.0–46.0)
Lymphs Abs: 2.1 10*3/uL (ref 0.7–4.0)
MCHC: 33.8 g/dL (ref 30.0–36.0)
MCV: 88.8 fl (ref 78.0–100.0)
MONOS PCT: 5.6 % (ref 3.0–12.0)
Monocytes Absolute: 0.6 10*3/uL (ref 0.1–1.0)
NEUTROS ABS: 7.9 10*3/uL — AB (ref 1.4–7.7)
Neutrophils Relative %: 72.6 % (ref 43.0–77.0)
PLATELETS: 382 10*3/uL (ref 150.0–400.0)
RBC: 3.65 Mil/uL — ABNORMAL LOW (ref 3.87–5.11)
RDW: 14 % (ref 11.5–15.5)
WBC: 10.9 10*3/uL — ABNORMAL HIGH (ref 4.0–10.5)

## 2016-09-15 LAB — BASIC METABOLIC PANEL
BUN: 30 mg/dL — AB (ref 6–23)
CO2: 27 mEq/L (ref 19–32)
CREATININE: 1.35 mg/dL — AB (ref 0.40–1.20)
Calcium: 8.9 mg/dL (ref 8.4–10.5)
Chloride: 104 mEq/L (ref 96–112)
GFR: 40.71 mL/min — AB (ref >60.00)
Glucose, Bld: 136 mg/dL — ABNORMAL HIGH (ref 70–99)
Potassium: 4.6 mEq/L (ref 3.5–5.1)
Sodium: 137 mEq/L (ref 135–145)

## 2016-09-16 ENCOUNTER — Telehealth: Payer: Self-pay | Admitting: *Deleted

## 2016-09-16 MED ORDER — SULFAMETHOXAZOLE-TRIMETHOPRIM 800-160 MG PO TABS: 1.0000 | ORAL_TABLET | Freq: Two times a day (BID) | ORAL | 0 refills | Status: DC

## 2016-09-16 NOTE — Telephone Encounter (Signed)
-----   Message from Abner Greenspan, MD sent at 09/15/2016  5:47 PM EDT ----- Released on mychart  Please call in bactrim DS 1 po bid for 7 d #14 no ref  Increase water intake  Follow up in 3-4 weeks -we will re check urine and labs and see how she is feeling  If any problems or side effects please let me know

## 2016-09-16 NOTE — Telephone Encounter (Signed)
Pt notified of lab results and I read Dr. Marliss Coots comments off of mychart. f/u appt scheduled and Rx sent to pharmacy

## 2016-09-21 ENCOUNTER — Other Ambulatory Visit: Payer: Self-pay | Admitting: Family Medicine

## 2016-09-25 ENCOUNTER — Other Ambulatory Visit: Payer: Self-pay | Admitting: Family Medicine

## 2016-09-25 NOTE — Telephone Encounter (Signed)
Last filled on 05/22/16 #90 with 3 additional refills, CPE scheduled for 02/17/17 (AWV scheduled too)

## 2016-09-27 NOTE — Telephone Encounter (Signed)
Will refill electronically  

## 2016-10-01 ENCOUNTER — Other Ambulatory Visit: Payer: Self-pay | Admitting: *Deleted

## 2016-10-01 MED ORDER — CITALOPRAM HYDROBROMIDE 40 MG PO TABS: 40.0000 mg | ORAL_TABLET | Freq: Every day | ORAL | 4 refills | Status: DC

## 2016-10-01 NOTE — Telephone Encounter (Signed)
Please refill until appt  

## 2016-10-01 NOTE — Telephone Encounter (Signed)
Last filled on 07/22/16 #30 tabs with 0 refills, CPE scheduled 02/17/17 (AWV scheduled too), please advise

## 2016-10-01 NOTE — Telephone Encounter (Signed)
done

## 2016-10-09 ENCOUNTER — Encounter: Payer: Self-pay | Admitting: Family Medicine

## 2016-10-09 ENCOUNTER — Ambulatory Visit (INDEPENDENT_AMBULATORY_CARE_PROVIDER_SITE_OTHER): Payer: Medicare Other | Admitting: Family Medicine

## 2016-10-09 VITALS — BP 134/68 | HR 63 | Temp 98.5°F | Ht 63.5 in | Wt 213.0 lb

## 2016-10-09 DIAGNOSIS — Z87440 Personal history of urinary (tract) infections: Secondary | ICD-10-CM

## 2016-10-09 DIAGNOSIS — Z6837 Body mass index (BMI) 37.0-37.9, adult: Secondary | ICD-10-CM

## 2016-10-09 DIAGNOSIS — E119 Type 2 diabetes mellitus without complications: Secondary | ICD-10-CM | POA: Diagnosis not present

## 2016-10-09 DIAGNOSIS — R319 Hematuria, unspecified: Secondary | ICD-10-CM

## 2016-10-09 DIAGNOSIS — N390 Urinary tract infection, site not specified: Secondary | ICD-10-CM

## 2016-10-09 DIAGNOSIS — N289 Disorder of kidney and ureter, unspecified: Secondary | ICD-10-CM | POA: Diagnosis not present

## 2016-10-09 DIAGNOSIS — W57XXXA Bitten or stung by nonvenomous insect and other nonvenomous arthropods, initial encounter: Secondary | ICD-10-CM | POA: Diagnosis not present

## 2016-10-09 DIAGNOSIS — I10 Essential (primary) hypertension: Secondary | ICD-10-CM

## 2016-10-09 DIAGNOSIS — E66812 Obesity, class 2: Secondary | ICD-10-CM

## 2016-10-09 LAB — POC URINALSYSI DIPSTICK (AUTOMATED)
BILIRUBIN UA: NEGATIVE
Glucose, UA: NEGATIVE
KETONES UA: NEGATIVE
NITRITE UA: NEGATIVE
PH UA: 6 (ref 5.0–8.0)
Protein, UA: 15
RBC UA: NEGATIVE
Spec Grav, UA: 1.02 (ref 1.010–1.025)
Urobilinogen, UA: 0.2 E.U./dL

## 2016-10-09 NOTE — Patient Instructions (Addendum)
You need 64 oz of fluids per day - mostly water !  This is for kidney health and to prevent uti  Urine culture pending  Will alert when it returns   Keep tick bite clean and dry  If any fever/rash / bullseye mark at bite site - alert me
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
## 2016-10-11 NOTE — Assessment & Plan Note (Signed)
Discussed how this problem influences overall health and the risks it imposes  Reviewed plan for weight loss with lower calorie diet (via better food choices and also portion control or program like weight watchers) and exercise building up to or more than 30 minutes 5 days per week including some aerobic activity    

## 2016-10-11 NOTE — Assessment & Plan Note (Signed)
Lab Results  Component Value Date   CREATININE 1.35 (H) 09/15/2016   This is up  ? Due to uti or dehydration Disc plan for 64 oz of fluids daily  Pending culture result- will tx for pos

## 2016-10-11 NOTE — Assessment & Plan Note (Signed)
Lab Results  Component Value Date   HGBA1C 6.1 08/12/2016   Up a bit Disc better diet/low glycemic and exercise as tolerated Disc foot and eye care

## 2016-10-11 NOTE — Assessment & Plan Note (Signed)
ua still has wbc s/p tx with bactrim (also upset stomach) Still leuk in ua  Pending cx  Cr up- enc strongly to inc fluids with goal of 64 oz per day

## 2016-10-11 NOTE — Assessment & Plan Note (Signed)
bp in fair control at this time  BP Readings from Last 1 Encounters:  10/09/16 134/68   No changes needed Disc lifstyle change with low sodium diet and exercise

## 2016-10-11 NOTE — Assessment & Plan Note (Signed)
Post R knee Wood or dog tick suspected-not engorged or attached long  1-2 mm spot of erythema - small pc of tick leg removed from the bite/ cleaned well  Disc s/s to watch for such as bullseye shaped bite site/rash/fever/ ST or joint pain

## 2016-10-12 ENCOUNTER — Telehealth: Payer: Self-pay | Admitting: Family Medicine

## 2016-10-12 LAB — URINE CULTURE

## 2016-10-12 MED ORDER — LEVOFLOXACIN 250 MG PO TABS: 250.0000 mg | ORAL_TABLET | Freq: Every day | ORAL | 0 refills | Status: DC

## 2016-10-12 NOTE — Telephone Encounter (Signed)
levaquin to [facility] for uti

## 2016-10-26 ENCOUNTER — Other Ambulatory Visit: Payer: Self-pay | Admitting: Family Medicine

## 2016-10-26 DIAGNOSIS — R319 Hematuria, unspecified: Secondary | ICD-10-CM

## 2016-10-26 DIAGNOSIS — N390 Urinary tract infection, site not specified: Secondary | ICD-10-CM

## 2016-10-26 DIAGNOSIS — N289 Disorder of kidney and ureter, unspecified: Secondary | ICD-10-CM

## 2016-10-26 DIAGNOSIS — I10 Essential (primary) hypertension: Secondary | ICD-10-CM

## 2016-10-27 ENCOUNTER — Other Ambulatory Visit (INDEPENDENT_AMBULATORY_CARE_PROVIDER_SITE_OTHER): Payer: Medicare Other

## 2016-10-27 DIAGNOSIS — N289 Disorder of kidney and ureter, unspecified: Secondary | ICD-10-CM

## 2016-10-27 DIAGNOSIS — R319 Hematuria, unspecified: Secondary | ICD-10-CM

## 2016-10-27 DIAGNOSIS — N390 Urinary tract infection, site not specified: Secondary | ICD-10-CM

## 2016-10-27 DIAGNOSIS — I10 Essential (primary) hypertension: Secondary | ICD-10-CM | POA: Diagnosis not present

## 2016-10-27 LAB — BASIC METABOLIC PANEL
BUN: 17 mg/dL (ref 6–23)
CHLORIDE: 105 meq/L (ref 96–112)
CO2: 29 meq/L (ref 19–32)
Calcium: 9.2 mg/dL (ref 8.4–10.5)
Creatinine, Ser: 1.07 mg/dL (ref 0.40–1.20)
GFR: 53.22 mL/min — ABNORMAL LOW (ref >60.00)
GLUCOSE: 114 mg/dL — AB (ref 70–99)
POTASSIUM: 4.5 meq/L (ref 3.5–5.1)
SODIUM: 139 meq/L (ref 135–145)

## 2016-10-28 LAB — URINE CULTURE: Organism ID, Bacteria: NO GROWTH

## 2016-11-02 DIAGNOSIS — H59811 Chorioretinal scars after surgery for detachment, right eye: Secondary | ICD-10-CM | POA: Diagnosis not present

## 2016-11-02 DIAGNOSIS — H5203 Hypermetropia, bilateral: Secondary | ICD-10-CM | POA: Diagnosis not present

## 2016-11-02 DIAGNOSIS — H524 Presbyopia: Secondary | ICD-10-CM | POA: Diagnosis not present

## 2016-11-02 DIAGNOSIS — H04123 Dry eye syndrome of bilateral lacrimal glands: Secondary | ICD-10-CM | POA: Diagnosis not present

## 2016-11-02 DIAGNOSIS — H35413 Lattice degeneration of retina, bilateral: Secondary | ICD-10-CM | POA: Diagnosis not present

## 2016-11-02 DIAGNOSIS — D3132 Benign neoplasm of left choroid: Secondary | ICD-10-CM | POA: Diagnosis not present

## 2016-11-02 DIAGNOSIS — H2513 Age-related nuclear cataract, bilateral: Secondary | ICD-10-CM | POA: Diagnosis not present

## 2016-11-02 DIAGNOSIS — E113293 Type 2 diabetes mellitus with mild nonproliferative diabetic retinopathy without macular edema, bilateral: Secondary | ICD-10-CM | POA: Diagnosis not present

## 2016-11-02 LAB — HM DIABETES EYE EXAM

## 2016-11-17 ENCOUNTER — Encounter: Payer: Self-pay | Admitting: Family Medicine

## 2016-12-04 ENCOUNTER — Other Ambulatory Visit: Payer: Self-pay | Admitting: Family Medicine

## 2016-12-22 ENCOUNTER — Other Ambulatory Visit: Payer: Self-pay

## 2016-12-22 MED ORDER — GLUCOSE BLOOD VI STRP: ORAL_STRIP | 1 refills | Status: DC

## 2016-12-22 NOTE — Telephone Encounter (Signed)
[facility] left v/m requesting update on what type test strip pt is using; I spoke with pt and she is using GE100 test strips. Refill done per protocol.

## 2017-02-04 ENCOUNTER — Other Ambulatory Visit: Payer: Self-pay | Admitting: Family Medicine

## 2017-02-09 ENCOUNTER — Telehealth: Payer: Self-pay | Admitting: Family Medicine

## 2017-02-09 DIAGNOSIS — E119 Type 2 diabetes mellitus without complications: Secondary | ICD-10-CM

## 2017-02-09 DIAGNOSIS — D508 Other iron deficiency anemias: Secondary | ICD-10-CM

## 2017-02-09 DIAGNOSIS — E7800 Pure hypercholesterolemia, unspecified: Secondary | ICD-10-CM

## 2017-02-09 DIAGNOSIS — I10 Essential (primary) hypertension: Secondary | ICD-10-CM

## 2017-02-09 DIAGNOSIS — D518 Other vitamin B12 deficiency anemias: Secondary | ICD-10-CM

## 2017-02-09 NOTE — Telephone Encounter (Signed)
-----   Message from Eustace Pen, LPN sent at 3/72/9021  9:41 PM EDT ----- Regarding: Labs 9/26 Lab orders needed. Thank you  If none, please advise.   Medicare

## 2017-02-10 ENCOUNTER — Ambulatory Visit (INDEPENDENT_AMBULATORY_CARE_PROVIDER_SITE_OTHER): Payer: Medicare Other

## 2017-02-10 VITALS — BP 140/68 | HR 53 | Temp 98.7°F | Ht 64.0 in | Wt 221.2 lb

## 2017-02-10 DIAGNOSIS — E119 Type 2 diabetes mellitus without complications: Secondary | ICD-10-CM | POA: Diagnosis not present

## 2017-02-10 DIAGNOSIS — E7800 Pure hypercholesterolemia, unspecified: Secondary | ICD-10-CM | POA: Diagnosis not present

## 2017-02-10 DIAGNOSIS — Z23 Encounter for immunization: Secondary | ICD-10-CM | POA: Diagnosis not present

## 2017-02-10 DIAGNOSIS — D518 Other vitamin B12 deficiency anemias: Secondary | ICD-10-CM | POA: Diagnosis not present

## 2017-02-10 DIAGNOSIS — I10 Essential (primary) hypertension: Secondary | ICD-10-CM | POA: Diagnosis not present

## 2017-02-10 DIAGNOSIS — Z0000 Encounter for general adult medical examination without abnormal findings: Secondary | ICD-10-CM | POA: Diagnosis not present

## 2017-02-10 DIAGNOSIS — D508 Other iron deficiency anemias: Secondary | ICD-10-CM | POA: Diagnosis not present

## 2017-02-10 LAB — CBC WITH DIFFERENTIAL/PLATELET
BASOS ABS: 0 10*3/uL (ref 0.0–0.1)
Basophils Relative: 0.3 % (ref 0.0–3.0)
EOS ABS: 0.3 10*3/uL (ref 0.0–0.7)
Eosinophils Relative: 3.1 % (ref 0.0–5.0)
HCT: 33 % — ABNORMAL LOW (ref 36.0–46.0)
Hemoglobin: 11 g/dL — ABNORMAL LOW (ref 12.0–15.0)
LYMPHS ABS: 1.9 10*3/uL (ref 0.7–4.0)
Lymphocytes Relative: 17.4 % (ref 12.0–46.0)
MCHC: 33.2 g/dL (ref 30.0–36.0)
MCV: 90 fl (ref 78.0–100.0)
MONO ABS: 0.8 10*3/uL (ref 0.1–1.0)
MONOS PCT: 6.9 % (ref 3.0–12.0)
NEUTROS PCT: 72.3 % (ref 43.0–77.0)
Neutro Abs: 7.9 10*3/uL — ABNORMAL HIGH (ref 1.4–7.7)
Platelets: 344 10*3/uL (ref 150.0–400.0)
RBC: 3.67 Mil/uL — AB (ref 3.87–5.11)
RDW: 12.9 % (ref 11.5–15.5)
WBC: 11 10*3/uL — ABNORMAL HIGH (ref 4.0–10.5)

## 2017-02-10 LAB — COMPREHENSIVE METABOLIC PANEL
ALK PHOS: 39 U/L (ref 39–117)
ALT: 11 U/L (ref 0–35)
AST: 16 U/L (ref 0–37)
Albumin: 3.9 g/dL (ref 3.5–5.2)
BILIRUBIN TOTAL: 0.4 mg/dL (ref 0.2–1.2)
BUN: 21 mg/dL (ref 6–23)
CO2: 30 mEq/L (ref 19–32)
CREATININE: 1.16 mg/dL (ref 0.40–1.20)
Calcium: 9.3 mg/dL (ref 8.4–10.5)
Chloride: 102 mEq/L (ref 96–112)
GFR: 48.45 mL/min — ABNORMAL LOW (ref >60.00)
GLUCOSE: 97 mg/dL (ref 70–99)
Potassium: 4.7 mEq/L (ref 3.5–5.1)
SODIUM: 138 meq/L (ref 135–145)
TOTAL PROTEIN: 6.5 g/dL (ref 6.0–8.3)

## 2017-02-10 LAB — LIPID PANEL
CHOL/HDL RATIO: 4
Cholesterol: 161 mg/dL (ref 0–200)
HDL: 40.6 mg/dL (ref >39.00)
NonHDL: 119.95
TRIGLYCERIDES: 283 mg/dL — AB (ref 0.0–149.0)
VLDL: 56.6 mg/dL — ABNORMAL HIGH (ref 0.0–40.0)

## 2017-02-10 LAB — LDL CHOLESTEROL, DIRECT: Direct LDL: 69 mg/dL

## 2017-02-10 LAB — VITAMIN B12: Vitamin B-12: 427 pg/mL (ref 211–911)

## 2017-02-10 LAB — TSH: TSH: 2.11 u[IU]/mL (ref 0.35–4.50)

## 2017-02-10 LAB — FERRITIN: Ferritin: 37.1 ng/mL (ref 10.0–291.0)

## 2017-02-10 LAB — HEMOGLOBIN A1C: Hgb A1c MFr Bld: 5.9 % (ref 4.6–6.5)

## 2017-02-10 NOTE — Progress Notes (Signed)
PCP notes:   Health maintenance:  Foot exam - PCP please address at next appt Mammogram - addressed/pt plans to schedule appt Tetanus - postponed/insurance A1C - completed Flu vaccine - administered  Abnormal screenings:   Depression score: 7 Hearing - failed  Hearing Screening   125Hz  250Hz  500Hz  1000Hz  2000Hz  3000Hz  4000Hz  6000Hz  8000Hz   Right ear:   40 40 40  0    Left ear:   40 40 40  0      Patient concerns:   None  Nurse concerns:  None  Next PCP appt:   02/17/17 @ 1030  I reviewed health advisor's note, was available for consultation, and agree with documentation and plan. Loura Pardon MD

## 2017-02-10 NOTE — Progress Notes (Signed)
Pre visit review using our clinic review tool, if applicable. No additional management support is needed unless otherwise documented below in the visit note. 

## 2017-02-10 NOTE — Patient Instructions (Signed)
Ann Cummings , Thank you for taking time to come for your Medicare Wellness Visit. I appreciate your ongoing commitment to your health goals. Please review the following plan we discussed and let me know if I can assist you in the future.   These are the goals we discussed: Goals    . Increase physical activity          Starting 02/10/2017, I will continue to do leg lifts 3 times daily and walk to mailbox daily.        This is a list of the screening recommended for you and due dates:  Health Maintenance  Topic Date Due  . Complete foot exam   07/31/2017*  . Mammogram  08/15/2018*  . Tetanus Vaccine  10/13/2026*  . Hemoglobin A1C  02/12/2017  . Eye exam for diabetics  11/02/2017  . Colon Cancer Screening  04/24/2019  . Flu Shot  Completed  . DEXA scan (bone density measurement)  Completed  . Pneumonia vaccines  Completed  *Topic was postponed. The date shown is not the original due date.   Preventive Care for Adults  A healthy lifestyle and preventive care can promote health and wellness. Preventive health guidelines for adults include the following key practices.  . A routine yearly physical is a good way to check with your health care provider about your health and preventive screening. It is a chance to share any concerns and updates on your health and to receive a thorough exam.  . Visit your dentist for a routine exam and preventive care every 6 months. Brush your teeth twice a day and floss once a day. Good oral hygiene prevents tooth decay and gum disease.  . The frequency of eye exams is based on your age, health, family medical history, use  of contact lenses, and other factors. Follow your health care provider's ecommendations for frequency of eye exams.  . Eat a healthy diet. Foods like vegetables, fruits, whole grains, low-fat dairy products, and lean protein foods contain the nutrients you need without too many calories. Decrease your intake of foods high in solid fats,  added sugars, and salt. Eat the right amount of calories for you. Get information about a proper diet from your health care provider, if necessary.  . Regular physical exercise is one of the most important things you can do for your health. Most adults should get at least 150 minutes of moderate-intensity exercise (any activity that increases your heart rate and causes you to sweat) each week. In addition, most adults need muscle-strengthening exercises on 2 or more days a week.  Silver Sneakers may be a benefit available to you. To determine eligibility, you may visit the website: www.silversneakers.com or contact program at (505)761-0679 Mon-Fri between 8AM-8PM.   . Maintain a healthy weight. The body mass index (BMI) is a screening tool to identify possible weight problems. It provides an estimate of body fat based on height and weight. Your health care provider can find your BMI and can help you achieve or maintain a healthy weight.   For adults 20 years and older: ? A BMI below 18.5 is considered underweight. ? A BMI of 18.5 to 24.9 is normal. ? A BMI of 25 to 29.9 is considered overweight. ? A BMI of 30 and above is considered obese.   . Maintain normal blood lipids and cholesterol levels by exercising and minimizing your intake of saturated fat. Eat a balanced diet with plenty of fruit and vegetables. Blood tests  for lipids and cholesterol should begin at age 27 and be repeated every 5 years. If your lipid or cholesterol levels are high, you are over 50, or you are at high risk for heart disease, you may need your cholesterol levels checked more frequently. Ongoing high lipid and cholesterol levels should be treated with medicines if diet and exercise are not working.  . If you smoke, find out from your health care provider how to quit. If you do not use tobacco, please do not start.  . If you choose to drink alcohol, please do not consume more than 2 drinks per day. One drink is  considered to be 12 ounces (355 mL) of beer, 5 ounces (148 mL) of wine, or 1.5 ounces (44 mL) of liquor.  . If you are 28-39 years old, ask your health care provider if you should take aspirin to prevent strokes.  . Use sunscreen. Apply sunscreen liberally and repeatedly throughout the day. You should seek shade when your shadow is shorter than you. Protect yourself by wearing long sleeves, pants, a wide-brimmed hat, and sunglasses year round, whenever you are outdoors.  . Once a month, do a whole body skin exam, using a mirror to look at the skin on your back. Tell your health care provider of new moles, moles that have irregular borders, moles that are larger than a pencil eraser, or moles that have changed in shape or color.
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
## 2017-02-17 ENCOUNTER — Ambulatory Visit (INDEPENDENT_AMBULATORY_CARE_PROVIDER_SITE_OTHER): Payer: Medicare Other | Admitting: Family Medicine

## 2017-02-17 ENCOUNTER — Encounter: Payer: Self-pay | Admitting: Family Medicine

## 2017-02-17 VITALS — BP 138/60 | HR 60 | Temp 99.6°F | Ht 64.0 in | Wt 215.5 lb

## 2017-02-17 DIAGNOSIS — D508 Other iron deficiency anemias: Secondary | ICD-10-CM | POA: Diagnosis not present

## 2017-02-17 DIAGNOSIS — F3289 Other specified depressive episodes: Secondary | ICD-10-CM

## 2017-02-17 DIAGNOSIS — I10 Essential (primary) hypertension: Secondary | ICD-10-CM

## 2017-02-17 DIAGNOSIS — N289 Disorder of kidney and ureter, unspecified: Secondary | ICD-10-CM

## 2017-02-17 DIAGNOSIS — E119 Type 2 diabetes mellitus without complications: Secondary | ICD-10-CM

## 2017-02-17 DIAGNOSIS — Z6836 Body mass index (BMI) 36.0-36.9, adult: Secondary | ICD-10-CM

## 2017-02-17 DIAGNOSIS — E7800 Pure hypercholesterolemia, unspecified: Secondary | ICD-10-CM | POA: Diagnosis not present

## 2017-02-17 DIAGNOSIS — D518 Other vitamin B12 deficiency anemias: Secondary | ICD-10-CM | POA: Diagnosis not present

## 2017-02-17 MED ORDER — AMLODIPINE BESYLATE 10 MG PO TABS: 10.0000 mg | ORAL_TABLET | Freq: Every day | ORAL | 3 refills | Status: DC

## 2017-02-17 MED ORDER — BENAZEPRIL HCL 20 MG PO TABS: 20.0000 mg | ORAL_TABLET | Freq: Every day | ORAL | 3 refills | Status: DC

## 2017-02-17 MED ORDER — OMEPRAZOLE 20 MG PO CPDR: 20.0000 mg | DELAYED_RELEASE_CAPSULE | Freq: Every day | ORAL | 3 refills | Status: DC

## 2017-02-17 MED ORDER — FUROSEMIDE 20 MG PO TABS: 20.0000 mg | ORAL_TABLET | Freq: Every day | ORAL | 3 refills | Status: DC

## 2017-02-17 MED ORDER — ATENOLOL 25 MG PO TABS: 12.5000 mg | ORAL_TABLET | Freq: Every day | ORAL | 3 refills | Status: DC

## 2017-02-17 MED ORDER — CITALOPRAM HYDROBROMIDE 40 MG PO TABS: 40.0000 mg | ORAL_TABLET | Freq: Every day | ORAL | 3 refills | Status: DC

## 2017-02-17 MED ORDER — ROSUVASTATIN CALCIUM 10 MG PO TABS: 5.0000 mg | ORAL_TABLET | Freq: Every day | ORAL | 3 refills | Status: DC
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
## 2017-02-17 NOTE — Patient Instructions (Addendum)
Look into getting a tetanus shot in a pharmacy Don't forget to schedule your mammogram   Try to get 1200-1500 mg of calcium per day with at least 1000 iu of vitamin D - for bone health  Exercise helps bones also   Keep working on better diet and weight loss   Follow up in 6 months

## 2017-02-18 NOTE — Assessment & Plan Note (Signed)
Better with current supplementation  Lab Results  Component Value Date   VITAMINB12 427 02/10/2017

## 2017-02-18 NOTE — Assessment & Plan Note (Signed)
Lab Results  Component Value Date   HGBA1C 5.9 02/10/2017   Improved/well controlled Enc further wt loss Eye and foot care discussed

## 2017-02-18 NOTE — Assessment & Plan Note (Signed)
bp in fair control at this time  BP Readings from Last 1 Encounters:  02/17/17 138/60   No changes needed Disc lifstyle change with low sodium diet and exercise  Labs reviewed

## 2017-02-18 NOTE — Assessment & Plan Note (Signed)
Stable with no change  Mildly elevated wbc also unchanged  No symptoms  Continue to follow

## 2017-02-18 NOTE — Assessment & Plan Note (Signed)
Disc goals for lipids and reasons to control them Rev labs with pt Rev low sat fat diet in detail LDL is well controlled on crestor and diet  Disc lower carb diet for triglycerides

## 2017-02-18 NOTE — Assessment & Plan Note (Signed)
Discussed how this problem influences overall health and the risks it imposes  Reviewed plan for weight loss with lower calorie diet (via better food choices and also portion control or program like weight watchers) and exercise building up to or more than 30 minutes 5 days per week including some aerobic activity    

## 2017-02-18 NOTE — Assessment & Plan Note (Signed)
Improved Enc to continue good water intake

## 2017-02-18 NOTE — Assessment & Plan Note (Signed)
Improved with celexa  Reviewed stressors/ coping techniques/symptoms/ support sources/ tx options and side effects in detail today  No change in tx

## 2017-06-22 ENCOUNTER — Other Ambulatory Visit: Payer: Self-pay | Admitting: Family Medicine

## 2017-06-22 DIAGNOSIS — Z1231 Encounter for screening mammogram for malignant neoplasm of breast: Secondary | ICD-10-CM

## 2017-07-09 ENCOUNTER — Ambulatory Visit
Admission: RE | Admit: 2017-07-09 | Discharge: 2017-07-09 | Disposition: A | Discharge status: Home / Self Care | Payer: Medicare Other | Source: Ambulatory Visit | Attending: Family Medicine | Admitting: Family Medicine

## 2017-07-09 DIAGNOSIS — R928 Other abnormal and inconclusive findings on diagnostic imaging of breast: Secondary | ICD-10-CM | POA: Diagnosis not present

## 2017-07-09 DIAGNOSIS — Z1231 Encounter for screening mammogram for malignant neoplasm of breast: Secondary | ICD-10-CM | POA: Diagnosis not present

## 2017-07-12 ENCOUNTER — Other Ambulatory Visit: Payer: Self-pay | Admitting: Family Medicine

## 2017-07-12 DIAGNOSIS — R928 Other abnormal and inconclusive findings on diagnostic imaging of breast: Secondary | ICD-10-CM

## 2017-07-12 DIAGNOSIS — N6320 Unspecified lump in the left breast, unspecified quadrant: Secondary | ICD-10-CM

## 2017-07-16 ENCOUNTER — Telehealth: Payer: Self-pay | Admitting: *Deleted

## 2017-07-16 NOTE — Telephone Encounter (Signed)
PA for pt's Dicyclomine was done at [email or web address], I will await a response

## 2017-07-19 ENCOUNTER — Ambulatory Visit
Admission: RE | Admit: 2017-07-19 | Discharge: 2017-07-19 | Disposition: A | Discharge status: Home / Self Care | Payer: Medicare Other | Source: Ambulatory Visit | Attending: Family Medicine | Admitting: Family Medicine

## 2017-07-19 ENCOUNTER — Other Ambulatory Visit: Payer: Self-pay | Admitting: Family Medicine

## 2017-07-19 DIAGNOSIS — R928 Other abnormal and inconclusive findings on diagnostic imaging of breast: Secondary | ICD-10-CM

## 2017-07-19 DIAGNOSIS — R922 Inconclusive mammogram: Secondary | ICD-10-CM | POA: Diagnosis not present

## 2017-07-19 DIAGNOSIS — N6320 Unspecified lump in the left breast, unspecified quadrant: Secondary | ICD-10-CM

## 2017-07-19 DIAGNOSIS — N6321 Unspecified lump in the left breast, upper outer quadrant: Secondary | ICD-10-CM | POA: Insufficient documentation

## 2017-07-19 NOTE — Telephone Encounter (Signed)
PA was approved, pharmacy notified, letter placed in Dr. Marliss Coots inbox to sign and send for scanning

## 2017-07-26 ENCOUNTER — Ambulatory Visit
Admission: RE | Admit: 2017-07-26 | Discharge: 2017-07-26 | Disposition: A | Discharge status: Home / Self Care | Payer: Medicare Other | Source: Ambulatory Visit | Attending: Family Medicine | Admitting: Family Medicine

## 2017-07-26 DIAGNOSIS — N6324 Unspecified lump in the left breast, lower inner quadrant: Secondary | ICD-10-CM | POA: Diagnosis not present

## 2017-07-26 DIAGNOSIS — C50412 Malignant neoplasm of upper-outer quadrant of left female breast: Secondary | ICD-10-CM | POA: Diagnosis not present

## 2017-07-26 DIAGNOSIS — R928 Other abnormal and inconclusive findings on diagnostic imaging of breast: Secondary | ICD-10-CM

## 2017-07-26 DIAGNOSIS — N6320 Unspecified lump in the left breast, unspecified quadrant: Secondary | ICD-10-CM

## 2017-07-26 DIAGNOSIS — N6321 Unspecified lump in the left breast, upper outer quadrant: Secondary | ICD-10-CM | POA: Diagnosis not present

## 2017-07-26 DIAGNOSIS — C50919 Malignant neoplasm of unspecified site of unspecified female breast: Secondary | ICD-10-CM

## 2017-07-26 HISTORY — PX: BREAST BIOPSY: SHX20

## 2017-07-26 HISTORY — DX: Malignant neoplasm of unspecified site of unspecified female breast: C50.919

## 2017-07-29 ENCOUNTER — Encounter: Payer: Self-pay | Admitting: *Deleted

## 2017-07-29 NOTE — Progress Notes (Signed)
  Oncology Nurse Navigator Documentation  Navigator Location: [facility] (07/29/17 1500) Referral date to RadOnc/MedOnc: 08/02/17 (07/29/17 1500) )Navigator Encounter Type: Introductory phone call (07/29/17 1500)   Abnormal Finding Date: 07/19/17 (07/29/17 1500) Confirmed Diagnosis Date: 07/28/17 (07/29/17 1500)                   Barriers/Navigation Needs: Coordination of Care (07/29/17 1500)   Interventions: Coordination of Care (07/29/17 1500)   Coordination of Care: Appts (07/29/17 1500)                  Time Spent with Patient: 30 (07/29/17 1500)   Called patient to establish navigation services.  She is newly diagnosed with invasive carcinoma of the left breast.  States she would like to see Dr. Bary Castilla for her surgical consult, and does not know one of the oncologist.  I have scheduled the patient to see Dr. Bary Castilla on 08/03/16 at 8:15 and Dr. Tasia Catchings on 08/02/17 @ 9:30.  She is to bring a photo ID, insurance card and all her meds to both appointments.  Informed I would meet her on Monday and give her some educational literature at that time.  She is agreeable to the plan.

## 2017-08-02 ENCOUNTER — Encounter: Payer: Self-pay | Admitting: Oncology

## 2017-08-02 ENCOUNTER — Inpatient Hospital Stay: Payer: Medicare Other | Attending: Oncology | Admitting: Oncology

## 2017-08-02 ENCOUNTER — Inpatient Hospital Stay: Payer: Medicare Other

## 2017-08-02 ENCOUNTER — Other Ambulatory Visit: Payer: Self-pay

## 2017-08-02 ENCOUNTER — Encounter: Payer: Self-pay | Admitting: *Deleted

## 2017-08-02 ENCOUNTER — Ambulatory Visit: Payer: Medicare Other | Admitting: Hematology and Oncology

## 2017-08-02 VITALS — BP 155/71 | HR 55 | Temp 98.5°F | Resp 16 | Ht 64.0 in | Wt 213.9 lb

## 2017-08-02 DIAGNOSIS — K589 Irritable bowel syndrome without diarrhea: Secondary | ICD-10-CM | POA: Diagnosis not present

## 2017-08-02 DIAGNOSIS — F419 Anxiety disorder, unspecified: Secondary | ICD-10-CM | POA: Insufficient documentation

## 2017-08-02 DIAGNOSIS — Z8619 Personal history of other infectious and parasitic diseases: Secondary | ICD-10-CM | POA: Insufficient documentation

## 2017-08-02 DIAGNOSIS — Z85828 Personal history of other malignant neoplasm of skin: Secondary | ICD-10-CM | POA: Diagnosis not present

## 2017-08-02 DIAGNOSIS — I10 Essential (primary) hypertension: Secondary | ICD-10-CM | POA: Diagnosis not present

## 2017-08-02 DIAGNOSIS — E669 Obesity, unspecified: Secondary | ICD-10-CM | POA: Insufficient documentation

## 2017-08-02 DIAGNOSIS — M1990 Unspecified osteoarthritis, unspecified site: Secondary | ICD-10-CM | POA: Diagnosis not present

## 2017-08-02 DIAGNOSIS — C50412 Malignant neoplasm of upper-outer quadrant of left female breast: Secondary | ICD-10-CM | POA: Insufficient documentation

## 2017-08-02 DIAGNOSIS — J45909 Unspecified asthma, uncomplicated: Secondary | ICD-10-CM | POA: Diagnosis not present

## 2017-08-02 DIAGNOSIS — Z170 Estrogen receptor positive status [ER+]: Secondary | ICD-10-CM | POA: Insufficient documentation

## 2017-08-02 DIAGNOSIS — K219 Gastro-esophageal reflux disease without esophagitis: Secondary | ICD-10-CM | POA: Diagnosis not present

## 2017-08-02 DIAGNOSIS — E119 Type 2 diabetes mellitus without complications: Secondary | ICD-10-CM | POA: Diagnosis not present

## 2017-08-02 DIAGNOSIS — Z79899 Other long term (current) drug therapy: Secondary | ICD-10-CM | POA: Insufficient documentation

## 2017-08-02 DIAGNOSIS — Z8673 Personal history of transient ischemic attack (TIA), and cerebral infarction without residual deficits: Secondary | ICD-10-CM | POA: Diagnosis not present

## 2017-08-02 DIAGNOSIS — D518 Other vitamin B12 deficiency anemias: Secondary | ICD-10-CM

## 2017-08-02 DIAGNOSIS — E785 Hyperlipidemia, unspecified: Secondary | ICD-10-CM | POA: Diagnosis not present

## 2017-08-02 DIAGNOSIS — F329 Major depressive disorder, single episode, unspecified: Secondary | ICD-10-CM | POA: Insufficient documentation

## 2017-08-02 DIAGNOSIS — G4730 Sleep apnea, unspecified: Secondary | ICD-10-CM | POA: Diagnosis not present

## 2017-08-02 LAB — COMPREHENSIVE METABOLIC PANEL
ALBUMIN: 3.6 g/dL (ref 3.5–5.0)
ALT: 12 U/L — AB (ref 14–54)
AST: 18 U/L (ref 15–41)
Alkaline Phosphatase: 43 U/L (ref 38–126)
Anion gap: 10 (ref 5–15)
BILIRUBIN TOTAL: 0.7 mg/dL (ref 0.3–1.2)
BUN: 23 mg/dL — AB (ref 6–20)
CHLORIDE: 105 mmol/L (ref 101–111)
CO2: 23 mmol/L (ref 22–32)
Calcium: 8.8 mg/dL — ABNORMAL LOW (ref 8.9–10.3)
Creatinine, Ser: 1.25 mg/dL — ABNORMAL HIGH (ref 0.44–1.00)
GFR calc Af Amer: 48 mL/min — ABNORMAL LOW (ref >60)
GFR calc non Af Amer: 41 mL/min — ABNORMAL LOW (ref >60)
GLUCOSE: 92 mg/dL (ref 65–99)
POTASSIUM: 4.3 mmol/L (ref 3.5–5.1)
Sodium: 138 mmol/L (ref 135–145)
TOTAL PROTEIN: 6.7 g/dL (ref 6.5–8.1)

## 2017-08-02 LAB — SURGICAL PATHOLOGY

## 2017-08-02 LAB — CBC WITH DIFFERENTIAL/PLATELET
Basophils Absolute: 0.1 10*3/uL (ref 0–0.1)
Basophils Relative: 1 %
EOS PCT: 3 %
Eosinophils Absolute: 0.3 10*3/uL (ref 0–0.7)
HCT: 31.4 % — ABNORMAL LOW (ref 35.0–47.0)
Hemoglobin: 10.7 g/dL — ABNORMAL LOW (ref 12.0–16.0)
LYMPHS ABS: 1.8 10*3/uL (ref 1.0–3.6)
LYMPHS PCT: 19 %
MCH: 30 pg (ref 26.0–34.0)
MCHC: 34 g/dL (ref 32.0–36.0)
MCV: 88.2 fL (ref 80.0–100.0)
MONO ABS: 0.7 10*3/uL (ref 0.2–0.9)
Monocytes Relative: 7 %
Neutro Abs: 6.5 10*3/uL (ref 1.4–6.5)
Neutrophils Relative %: 70 %
PLATELETS: 391 10*3/uL (ref 150–440)
RBC: 3.57 MIL/uL — ABNORMAL LOW (ref 3.80–5.20)
RDW: 13.7 % (ref 11.5–14.5)
WBC: 9.3 10*3/uL (ref 3.6–11.0)

## 2017-08-02 NOTE — Progress Notes (Signed)
Patient here for initial visit. See folllow up note.
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
## 2017-08-02 NOTE — Progress Notes (Signed)
  Oncology Nurse Navigator Documentation  Navigator Location: [facility] (08/02/17 1100) Referral date to RadOnc/MedOnc: 08/02/17 (08/02/17 1100) )Navigator Encounter Type: Initial MedOnc (08/02/17 1100)                         Barriers/Navigation Needs: Education;Coordination of Care (08/02/17 1100) Education: Newly Diagnosed Cancer Education (08/02/17 1100) Interventions: Education (08/02/17 1100)     Education Method: Verbal;Written (08/02/17 1100)  Support Groups/Services: Breast Support Group (08/02/17 1100)             Time Spent with Patient: 30 (08/02/17 1100)   Met patient and her daughter today during her initial medical oncology consult.  Gave patient breast cancer educational literature, "My Breast Cancer Treatment Handbook" by Josephine Igo, RN.  Offered support group and other resources here at the cancer center.  She is to call if she has any questions or needs.

## 2017-08-03 ENCOUNTER — Other Ambulatory Visit: Payer: Self-pay | Admitting: *Deleted

## 2017-08-03 ENCOUNTER — Encounter: Payer: Self-pay | Admitting: General Surgery

## 2017-08-03 ENCOUNTER — Other Ambulatory Visit: Payer: Self-pay | Admitting: General Surgery

## 2017-08-03 ENCOUNTER — Ambulatory Visit (INDEPENDENT_AMBULATORY_CARE_PROVIDER_SITE_OTHER): Payer: Medicare Other | Admitting: General Surgery

## 2017-08-03 VITALS — BP 124/58 | HR 63 | Resp 18 | Ht 64.0 in | Wt 215.0 lb

## 2017-08-03 DIAGNOSIS — Z170 Estrogen receptor positive status [ER+]: Secondary | ICD-10-CM

## 2017-08-03 DIAGNOSIS — C50412 Malignant neoplasm of upper-outer quadrant of left female breast: Secondary | ICD-10-CM

## 2017-08-03 DIAGNOSIS — D518 Other vitamin B12 deficiency anemias: Secondary | ICD-10-CM

## 2017-08-03 LAB — CANCER ANTIGEN 15-3: CA 15-3: 20.8 U/mL (ref 0.0–25.0)

## 2017-08-03 LAB — FERRITIN: Ferritin: 36 ng/mL (ref 11–307)

## 2017-08-03 LAB — IRON AND TIBC
IRON: 66 ug/dL (ref 28–170)
SATURATION RATIOS: 19 % (ref 10.4–31.8)
TIBC: 356 ug/dL (ref 250–450)
UIBC: 290 ug/dL

## 2017-08-03 LAB — CANCER ANTIGEN 27.29: CA 27.29: 24.6 U/mL (ref 0.0–38.6)

## 2017-08-03 LAB — FOLATE: FOLATE: 16.2 ng/mL (ref >5.9)

## 2017-08-03 MED ORDER — LIDOCAINE-PRILOCAINE 2.5-2.5 % EX CREA: 1.0000 "application " | TOPICAL_CREAM | CUTANEOUS | 0 refills | Status: DC | PRN
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
## 2017-08-03 NOTE — Patient Instructions (Addendum)
The patient is aware to call back for any questions or concerns.  The patient is scheduled for surgery at [facility] on 08/16/17. She will pre admit at the hospital. We will call the patient with the date and time for her pre admit appointment and her arrival time and location for surgery. The patient is aware of date and instructions.

## 2017-08-04 ENCOUNTER — Other Ambulatory Visit: Payer: Self-pay

## 2017-08-06 ENCOUNTER — Encounter: Payer: Self-pay | Admitting: Radiology

## 2017-08-10 ENCOUNTER — Other Ambulatory Visit: Payer: Self-pay | Admitting: *Deleted

## 2017-08-10 ENCOUNTER — Other Ambulatory Visit: Payer: Self-pay

## 2017-08-10 ENCOUNTER — Encounter
Admission: RE | Admit: 2017-08-10 | Discharge: 2017-08-10 | Disposition: A | Discharge status: Home / Self Care | Payer: Medicare Other | Source: Ambulatory Visit | Attending: General Surgery | Admitting: General Surgery

## 2017-08-10 DIAGNOSIS — I10 Essential (primary) hypertension: Secondary | ICD-10-CM | POA: Diagnosis not present

## 2017-08-10 DIAGNOSIS — Z01818 Encounter for other preprocedural examination: Secondary | ICD-10-CM | POA: Insufficient documentation

## 2017-08-10 HISTORY — DX: Adverse effect of unspecified anesthetic, initial encounter: T41.45XA

## 2017-08-10 HISTORY — DX: Other complications of anesthesia, initial encounter: T88.59XA

## 2017-08-10 MED ORDER — DICYCLOMINE HCL 20 MG PO TABS: ORAL_TABLET | ORAL | 0 refills | Status: DC

## 2017-08-10 NOTE — Patient Instructions (Signed)
Your procedure is scheduled on: August 16, 2017  Report to [place] AT 7:45 AM   REMEMBER: Instructions that are not followed completely may result in serious medical risk, up to and including death; or upon the discretion of your surgeon and anesthesiologist your surgery may need to be rescheduled.  Do not eat food after midnight the night before your procedure.  No gum chewing, lozengers or hard candies.  You may however, drink CLEAR liquids up to 2 hours before you are scheduled to arrive for your surgery. Do not drink anything within 2 hours of the start of your surgery.  Clear liquids include: - water   Do NOT drink anything that is not on this list.  Type 1 and Type 2 diabetics should only drink water.  No Alcohol for 24 hours before or after surgery.  No Smoking including e-cigarettes for 24 hours prior to surgery.  No chewable tobacco products for at least 6 hours prior to surgery.  No nicotine patches on the day of surgery.  On the morning of surgery brush your teeth with toothpaste and water, you may rinse your mouth with mouthwash if you wish. Do not swallow any toothpaste or mouthwash.  Notify your doctor if there is any change in your medical condition (cold, fever, infection).  Do not wear jewelry, make-up, hairpins, clips or nail polish.  Do not wear lotions, powders, or perfumes. You may NOT wear deodorant.  Do not shave 48 hours prior to surgery. Men may shave face and neck.  Contacts and dentures may not be worn into surgery.  Do not bring valuables to the hospital, including drivers license, insurance or credit cards.  [facility] [facility] is not responsible for any belongings or valuables.   TAKE THESE MEDICATIONS THE MORNING OF SURGERY: TAKE WITH SIP OF WATER  OMEPRAZOLE TAKE DOSE NIGHT BEFORE SURGERY AND DOSE  THE MORNING OF SURGERY AMLODIPINE ATENOLOL DICYCLOMINE    Use CHG Soap or wipes as directed on instruction sheet.  USE EMLA  CREAM AS DIRECTED BY MD OFFICE  Use inhalers on the day of surgery   Follow recommendations from Cardiologist, Pulmonologist or PCP regarding stopping Aspirin, Coumadin, Plavix, Eliquis, Pradaxa, or Pletal.  Stop Anti-inflammatories (NSAIDS) such as Advil, Aleve, Ibuprofen, Motrin, Naproxen, Naprosyn and Aspirin based products such as Excedrin, Goodys Powder, BC Powder. (May take Tylenol or Acetaminophen if needed.)  Stop ANY OVER THE COUNTER supplements until after surgery. (May continue Vitamin D, Vitamin B, and multivitamin.)  Wear comfortable clothing (specific to your surgery type) to the hospital.  Plan for stool softeners for home use.   If you are being discharged the day of surgery, you will not be allowed to drive home. You will need a responsible adult to drive you home and stay with you that night.   If you are taking public transportation, you will need to have a responsible adult with you. Please confirm with your physician that it is acceptable to use public transportation.   Please call [phone] if you have any questions about these instructions.

## 2017-08-15 MED ORDER — CEFAZOLIN SODIUM-DEXTROSE 2-4 GM/100ML-% IV SOLN
2.0000 g | INTRAVENOUS | Status: AC
  Administered 2017-08-16: 2 g via INTRAVENOUS

## 2017-08-16 ENCOUNTER — Other Ambulatory Visit: Payer: Self-pay

## 2017-08-16 ENCOUNTER — Encounter
Admission: RE | Admit: 2017-08-16 | Discharge: 2017-08-16 | Disposition: A | Discharge status: Home / Self Care | Payer: Medicare Other | Source: Ambulatory Visit | Attending: General Surgery | Admitting: General Surgery

## 2017-08-16 ENCOUNTER — Telehealth: Payer: Self-pay | Admitting: *Deleted

## 2017-08-16 ENCOUNTER — Ambulatory Visit
Admission: RE | Admit: 2017-08-16 | Discharge: 2017-08-16 | Disposition: A | Discharge status: Home / Self Care | Payer: Medicare Other | Source: Ambulatory Visit | Attending: General Surgery | Admitting: General Surgery

## 2017-08-16 ENCOUNTER — Ambulatory Visit: Payer: Medicare Other | Admitting: Anesthesiology

## 2017-08-16 ENCOUNTER — Encounter: Payer: Self-pay | Admitting: *Deleted

## 2017-08-16 ENCOUNTER — Encounter
Admission: RE | Disposition: A | Discharge status: Home / Self Care | Payer: Self-pay | Source: Ambulatory Visit | Attending: General Surgery

## 2017-08-16 DIAGNOSIS — E785 Hyperlipidemia, unspecified: Secondary | ICD-10-CM | POA: Diagnosis not present

## 2017-08-16 DIAGNOSIS — Z881 Allergy status to other antibiotic agents status: Secondary | ICD-10-CM | POA: Insufficient documentation

## 2017-08-16 DIAGNOSIS — Z170 Estrogen receptor positive status [ER+]: Principal | ICD-10-CM

## 2017-08-16 DIAGNOSIS — Z833 Family history of diabetes mellitus: Secondary | ICD-10-CM | POA: Insufficient documentation

## 2017-08-16 DIAGNOSIS — Z79899 Other long term (current) drug therapy: Secondary | ICD-10-CM | POA: Insufficient documentation

## 2017-08-16 DIAGNOSIS — Z841 Family history of disorders of kidney and ureter: Secondary | ICD-10-CM | POA: Diagnosis not present

## 2017-08-16 DIAGNOSIS — Z7951 Long term (current) use of inhaled steroids: Secondary | ICD-10-CM | POA: Insufficient documentation

## 2017-08-16 DIAGNOSIS — Z85828 Personal history of other malignant neoplasm of skin: Secondary | ICD-10-CM | POA: Diagnosis not present

## 2017-08-16 DIAGNOSIS — M1990 Unspecified osteoarthritis, unspecified site: Secondary | ICD-10-CM | POA: Insufficient documentation

## 2017-08-16 DIAGNOSIS — E669 Obesity, unspecified: Secondary | ICD-10-CM | POA: Diagnosis not present

## 2017-08-16 DIAGNOSIS — K219 Gastro-esophageal reflux disease without esophagitis: Secondary | ICD-10-CM | POA: Diagnosis not present

## 2017-08-16 DIAGNOSIS — C50412 Malignant neoplasm of upper-outer quadrant of left female breast: Secondary | ICD-10-CM | POA: Insufficient documentation

## 2017-08-16 DIAGNOSIS — K589 Irritable bowel syndrome without diarrhea: Secondary | ICD-10-CM | POA: Diagnosis not present

## 2017-08-16 DIAGNOSIS — I499 Cardiac arrhythmia, unspecified: Secondary | ICD-10-CM | POA: Insufficient documentation

## 2017-08-16 DIAGNOSIS — Z86010 Personal history of colonic polyps: Secondary | ICD-10-CM | POA: Diagnosis not present

## 2017-08-16 DIAGNOSIS — Z8049 Family history of malignant neoplasm of other genital organs: Secondary | ICD-10-CM | POA: Insufficient documentation

## 2017-08-16 DIAGNOSIS — Z825 Family history of asthma and other chronic lower respiratory diseases: Secondary | ICD-10-CM | POA: Insufficient documentation

## 2017-08-16 DIAGNOSIS — F329 Major depressive disorder, single episode, unspecified: Secondary | ICD-10-CM | POA: Insufficient documentation

## 2017-08-16 DIAGNOSIS — Z885 Allergy status to narcotic agent status: Secondary | ICD-10-CM | POA: Insufficient documentation

## 2017-08-16 DIAGNOSIS — Z800 Family history of malignant neoplasm of digestive organs: Secondary | ICD-10-CM | POA: Insufficient documentation

## 2017-08-16 DIAGNOSIS — G4733 Obstructive sleep apnea (adult) (pediatric): Secondary | ICD-10-CM | POA: Diagnosis not present

## 2017-08-16 DIAGNOSIS — Z9049 Acquired absence of other specified parts of digestive tract: Secondary | ICD-10-CM | POA: Diagnosis not present

## 2017-08-16 DIAGNOSIS — G4730 Sleep apnea, unspecified: Secondary | ICD-10-CM | POA: Insufficient documentation

## 2017-08-16 DIAGNOSIS — R011 Cardiac murmur, unspecified: Secondary | ICD-10-CM | POA: Diagnosis not present

## 2017-08-16 DIAGNOSIS — Z888 Allergy status to other drugs, medicaments and biological substances status: Secondary | ICD-10-CM | POA: Insufficient documentation

## 2017-08-16 DIAGNOSIS — R42 Dizziness and giddiness: Secondary | ICD-10-CM | POA: Insufficient documentation

## 2017-08-16 DIAGNOSIS — I10 Essential (primary) hypertension: Secondary | ICD-10-CM | POA: Diagnosis not present

## 2017-08-16 DIAGNOSIS — J45909 Unspecified asthma, uncomplicated: Secondary | ICD-10-CM | POA: Diagnosis not present

## 2017-08-16 DIAGNOSIS — I872 Venous insufficiency (chronic) (peripheral): Secondary | ICD-10-CM | POA: Insufficient documentation

## 2017-08-16 DIAGNOSIS — R51 Headache: Secondary | ICD-10-CM | POA: Diagnosis not present

## 2017-08-16 DIAGNOSIS — E119 Type 2 diabetes mellitus without complications: Secondary | ICD-10-CM | POA: Diagnosis not present

## 2017-08-16 DIAGNOSIS — F419 Anxiety disorder, unspecified: Secondary | ICD-10-CM | POA: Diagnosis not present

## 2017-08-16 DIAGNOSIS — Z832 Family history of diseases of the blood and blood-forming organs and certain disorders involving the immune mechanism: Secondary | ICD-10-CM | POA: Insufficient documentation

## 2017-08-16 DIAGNOSIS — C50912 Malignant neoplasm of unspecified site of left female breast: Secondary | ICD-10-CM | POA: Diagnosis not present

## 2017-08-16 DIAGNOSIS — Z8673 Personal history of transient ischemic attack (TIA), and cerebral infarction without residual deficits: Secondary | ICD-10-CM | POA: Diagnosis not present

## 2017-08-16 DIAGNOSIS — Z8249 Family history of ischemic heart disease and other diseases of the circulatory system: Secondary | ICD-10-CM | POA: Insufficient documentation

## 2017-08-16 DIAGNOSIS — Z7984 Long term (current) use of oral hypoglycemic drugs: Secondary | ICD-10-CM | POA: Insufficient documentation

## 2017-08-16 DIAGNOSIS — Z8371 Family history of colonic polyps: Secondary | ICD-10-CM | POA: Insufficient documentation

## 2017-08-16 DIAGNOSIS — D649 Anemia, unspecified: Secondary | ICD-10-CM | POA: Insufficient documentation

## 2017-08-16 HISTORY — PX: BREAST LUMPECTOMY: SHX2

## 2017-08-16 HISTORY — PX: BREAST LUMPECTOMY WITH SENTINEL LYMPH NODE BIOPSY: SHX5597

## 2017-08-16 HISTORY — PX: SENTINEL NODE BIOPSY: SHX6608

## 2017-08-16 LAB — GLUCOSE, CAPILLARY
Glucose-Capillary: 106 mg/dL — ABNORMAL HIGH (ref 65–99)
Glucose-Capillary: 122 mg/dL — ABNORMAL HIGH (ref 65–99)

## 2017-08-16 SURGERY — BREAST LUMPECTOMY WITH SENTINEL LYMPH NODE BX
Anesthesia: General | Laterality: Left | Wound class: Clean

## 2017-08-16 MED ORDER — ACETAMINOPHEN 10 MG/ML IV SOLN
INTRAVENOUS | Status: DC | PRN
  Administered 2017-08-16: 1000 mg via INTRAVENOUS

## 2017-08-16 MED ORDER — FENTANYL CITRATE (PF) 100 MCG/2ML IJ SOLN
INTRAMUSCULAR | Status: AC
  Filled 2017-08-16: qty 2

## 2017-08-16 MED ORDER — ONDANSETRON HCL 4 MG/2ML IJ SOLN
INTRAMUSCULAR | Status: DC | PRN
  Administered 2017-08-16: 4 mg via INTRAVENOUS

## 2017-08-16 MED ORDER — GLYCOPYRROLATE 0.2 MG/ML IJ SOLN
INTRAMUSCULAR | Status: AC
  Filled 2017-08-16: qty 1

## 2017-08-16 MED ORDER — PROPOFOL 10 MG/ML IV BOLUS
INTRAVENOUS | Status: AC
  Filled 2017-08-16: qty 20

## 2017-08-16 MED ORDER — DEXAMETHASONE SODIUM PHOSPHATE 10 MG/ML IJ SOLN
INTRAMUSCULAR | Status: AC
  Filled 2017-08-16: qty 1

## 2017-08-16 MED ORDER — ONDANSETRON HCL 4 MG/2ML IJ SOLN: 4.0000 mg | Freq: Once | INTRAMUSCULAR | Status: DC | PRN

## 2017-08-16 MED ORDER — SCOPOLAMINE 1 MG/3DAYS TD PT72
MEDICATED_PATCH | TRANSDERMAL | Status: AC
  Filled 2017-08-16: qty 1

## 2017-08-16 MED ORDER — METHYLENE BLUE 0.5 % INJ SOLN
INTRAVENOUS | Status: DC | PRN
  Administered 2017-08-16: 2 mL via SUBMUCOSAL

## 2017-08-16 MED ORDER — PROPOFOL 10 MG/ML IV BOLUS
INTRAVENOUS | Status: DC | PRN
  Administered 2017-08-16 (×2): 50 mg via INTRAVENOUS
  Administered 2017-08-16: 150 mg via INTRAVENOUS

## 2017-08-16 MED ORDER — TECHNETIUM TC 99M SULFUR COLLOID FILTERED
1.0000 | Freq: Once | INTRAVENOUS | Status: AC | PRN
  Administered 2017-08-16: 0.724 via INTRADERMAL

## 2017-08-16 MED ORDER — EPHEDRINE SULFATE 50 MG/ML IJ SOLN
INTRAMUSCULAR | Status: DC | PRN
  Administered 2017-08-16 (×6): 10 mg via INTRAVENOUS

## 2017-08-16 MED ORDER — CEFAZOLIN SODIUM-DEXTROSE 2-4 GM/100ML-% IV SOLN
INTRAVENOUS | Status: AC
  Filled 2017-08-16: qty 100

## 2017-08-16 MED ORDER — FENTANYL CITRATE (PF) 100 MCG/2ML IJ SOLN: 25.0000 ug | INTRAMUSCULAR | Status: DC | PRN

## 2017-08-16 MED ORDER — METHYLENE BLUE 1 % INJ SOLN
INTRAMUSCULAR | Status: AC
  Filled 2017-08-16: qty 10

## 2017-08-16 MED ORDER — SODIUM CHLORIDE 0.9 % IV SOLN
INTRAVENOUS | Status: DC
  Administered 2017-08-16: 10:00:00 via INTRAVENOUS

## 2017-08-16 MED ORDER — BUPIVACAINE-EPINEPHRINE (PF) 0.5% -1:200000 IJ SOLN
INTRAMUSCULAR | Status: AC
  Filled 2017-08-16: qty 30

## 2017-08-16 MED ORDER — HYDROCODONE-ACETAMINOPHEN 5-325 MG PO TABS
1.0000 | ORAL_TABLET | ORAL | Status: DC | PRN
  Administered 2017-08-16: 1 via ORAL

## 2017-08-16 MED ORDER — EPHEDRINE SULFATE 50 MG/ML IJ SOLN
INTRAMUSCULAR | Status: AC
  Filled 2017-08-16: qty 1

## 2017-08-16 MED ORDER — CELECOXIB 200 MG PO CAPS
ORAL_CAPSULE | ORAL | Status: AC
  Filled 2017-08-16: qty 1

## 2017-08-16 MED ORDER — DEXAMETHASONE SODIUM PHOSPHATE 10 MG/ML IJ SOLN
INTRAMUSCULAR | Status: DC | PRN
  Administered 2017-08-16: 10 mg via INTRAVENOUS

## 2017-08-16 MED ORDER — ONDANSETRON HCL 4 MG/2ML IJ SOLN
INTRAMUSCULAR | Status: AC
Start: 2017-08-16 — End: 2017-08-16
  Filled 2017-08-16: qty 2

## 2017-08-16 MED ORDER — LIDOCAINE HCL (CARDIAC) 20 MG/ML IV SOLN
INTRAVENOUS | Status: DC | PRN
Start: 2017-08-16 — End: 2017-08-16
  Administered 2017-08-16: 80 mg via INTRAVENOUS

## 2017-08-16 MED ORDER — LIDOCAINE HCL (PF) 2 % IJ SOLN
INTRAMUSCULAR | Status: AC
  Filled 2017-08-16: qty 10

## 2017-08-16 MED ORDER — PHENYLEPHRINE HCL 10 MG/ML IJ SOLN
INTRAMUSCULAR | Status: DC | PRN
  Administered 2017-08-16 (×2): 100 ug via INTRAVENOUS

## 2017-08-16 MED ORDER — CELECOXIB 200 MG PO CAPS
200.0000 mg | ORAL_CAPSULE | ORAL | Status: AC
  Administered 2017-08-16: 10:00:00 via ORAL

## 2017-08-16 MED ORDER — HYDROCODONE-ACETAMINOPHEN 5-325 MG PO TABS
ORAL_TABLET | ORAL | Status: AC
  Filled 2017-08-16: qty 1

## 2017-08-16 MED ORDER — SUCCINYLCHOLINE CHLORIDE 20 MG/ML IJ SOLN
INTRAMUSCULAR | Status: DC | PRN
  Administered 2017-08-16: 100 mg via INTRAVENOUS

## 2017-08-16 MED ORDER — MIDAZOLAM HCL 2 MG/2ML IJ SOLN
INTRAMUSCULAR | Status: DC | PRN
  Administered 2017-08-16 (×2): 1 mg via INTRAVENOUS

## 2017-08-16 MED ORDER — HYDROCODONE-ACETAMINOPHEN 5-325 MG PO TABS: 1.0000 | ORAL_TABLET | ORAL | 0 refills | Status: DC | PRN

## 2017-08-16 MED ORDER — GLYCOPYRROLATE 0.2 MG/ML IJ SOLN
INTRAMUSCULAR | Status: DC | PRN
  Administered 2017-08-16: 0.2 mg via INTRAVENOUS

## 2017-08-16 MED ORDER — ACETAMINOPHEN 10 MG/ML IV SOLN
INTRAVENOUS | Status: AC
  Filled 2017-08-16: qty 100

## 2017-08-16 MED ORDER — BUPIVACAINE-EPINEPHRINE (PF) 0.5% -1:200000 IJ SOLN
INTRAMUSCULAR | Status: DC | PRN
  Administered 2017-08-16: 10 mL via PERINEURAL
  Administered 2017-08-16: 20 mL via PERINEURAL

## 2017-08-16 MED ORDER — MIDAZOLAM HCL 2 MG/2ML IJ SOLN
INTRAMUSCULAR | Status: AC
  Filled 2017-08-16: qty 2

## 2017-08-16 MED ORDER — SCOPOLAMINE 1 MG/3DAYS TD PT72
1.0000 | MEDICATED_PATCH | TRANSDERMAL | Status: DC
  Administered 2017-08-16: 1.5 mg via TRANSDERMAL

## 2017-08-16 MED ORDER — FENTANYL CITRATE (PF) 100 MCG/2ML IJ SOLN
INTRAMUSCULAR | Status: DC | PRN
  Administered 2017-08-16 (×3): 50 ug via INTRAVENOUS

## 2017-08-16 SURGICAL SUPPLY — 56 items
BINDER BREAST LRG (GAUZE/BANDAGES/DRESSINGS) IMPLANT
BINDER BREAST MEDIUM (GAUZE/BANDAGES/DRESSINGS) IMPLANT
BINDER BREAST XLRG (GAUZE/BANDAGES/DRESSINGS) IMPLANT
BINDER BREAST XXLRG (GAUZE/BANDAGES/DRESSINGS) ×3 IMPLANT
BLADE SURG 15 STRL SS SAFETY (BLADE) ×6 IMPLANT
BULB RESERV EVAC DRAIN JP 100C (MISCELLANEOUS) IMPLANT
CANISTER SUCT 1200ML W/VALVE (MISCELLANEOUS) ×3 IMPLANT
CHLORAPREP W/TINT 26ML (MISCELLANEOUS) ×3 IMPLANT
CLOSURE WOUND 1/2 X4 (GAUZE/BANDAGES/DRESSINGS) ×1
CNTNR SPEC 2.5X3XGRAD LEK (MISCELLANEOUS)
CONT SPEC 4OZ STER OR WHT (MISCELLANEOUS)
CONTAINER SPEC 2.5X3XGRAD LEK (MISCELLANEOUS) IMPLANT
COVER PROBE FLX POLY STRL (MISCELLANEOUS) ×3 IMPLANT
DEVICE DUBIN SPECIMEN MAMMOGRA (MISCELLANEOUS) ×3 IMPLANT
DRAIN CHANNEL JP 15F RND 16 (MISCELLANEOUS) IMPLANT
DRAPE LAPAROTOMY TRNSV 106X77 (MISCELLANEOUS) ×3 IMPLANT
DRSG TELFA 3X8 NADH (GAUZE/BANDAGES/DRESSINGS) ×3 IMPLANT
ELECT CAUTERY BLADE TIP 2.5 (TIP) ×3
ELECT REM PT RETURN 9FT ADLT (ELECTROSURGICAL) ×3
ELECTRODE CAUTERY BLDE TIP 2.5 (TIP) ×1 IMPLANT
ELECTRODE REM PT RTRN 9FT ADLT (ELECTROSURGICAL) ×1 IMPLANT
GAUZE FLUFF 18X24 1PLY STRL (GAUZE/BANDAGES/DRESSINGS) ×3 IMPLANT
GAUZE SPONGE 4X4 12PLY STRL (GAUZE/BANDAGES/DRESSINGS) ×3 IMPLANT
GLOVE BIO SURGEON STRL SZ7.5 (GLOVE) ×6 IMPLANT
GLOVE INDICATOR 8.0 STRL GRN (GLOVE) ×6 IMPLANT
GOWN STRL REUS W/ TWL LRG LVL3 (GOWN DISPOSABLE) ×2 IMPLANT
GOWN STRL REUS W/TWL LRG LVL3 (GOWN DISPOSABLE) ×4
KIT TURNOVER KIT A (KITS) ×3 IMPLANT
LABEL OR SOLS (LABEL) ×3 IMPLANT
MARGIN MAP 10MM (MISCELLANEOUS) ×3 IMPLANT
NDL SAFETY ECLIPSE 18X1.5 (NEEDLE) ×1 IMPLANT
NEEDLE HYPO 18GX1.5 SHARP (NEEDLE) ×2
NEEDLE HYPO 22GX1.5 SAFETY (NEEDLE) ×3 IMPLANT
NEEDLE HYPO 25X1 1.5 SAFETY (NEEDLE) ×6 IMPLANT
PACK BASIN MINOR ARMC (MISCELLANEOUS) ×3 IMPLANT
RETRACTOR RING XSMALL (MISCELLANEOUS) ×1 IMPLANT
RTRCTR WOUND ALEXIS 13CM XS SH (MISCELLANEOUS) ×3
SHEARS FOC LG CVD HARMONIC 17C (MISCELLANEOUS) IMPLANT
SHEARS HARMONIC 9CM CVD (BLADE) IMPLANT
SLEVE PROBE SENORX GAMMA FIND (MISCELLANEOUS) IMPLANT
STRIP CLOSURE SKIN 1/2X4 (GAUZE/BANDAGES/DRESSINGS) ×2 IMPLANT
SUT ETHILON 3-0 FS-10 30 BLK (SUTURE) ×3
SUT SILK 2 0 (SUTURE) ×2
SUT SILK 2-0 18XBRD TIE 12 (SUTURE) ×1 IMPLANT
SUT VIC AB 2-0 CT1 27 (SUTURE) ×2
SUT VIC AB 2-0 CT1 TAPERPNT 27 (SUTURE) ×1 IMPLANT
SUT VIC AB 3-0 SH 27 (SUTURE) ×4
SUT VIC AB 3-0 SH 27X BRD (SUTURE) ×2 IMPLANT
SUT VIC AB 4-0 FS2 27 (SUTURE) ×6 IMPLANT
SUT VICRYL+ 3-0 144IN (SUTURE) ×3 IMPLANT
SUTURE EHLN 3-0 FS-10 30 BLK (SUTURE) ×1 IMPLANT
SWABSTK COMLB BENZOIN TINCTURE (MISCELLANEOUS) ×3 IMPLANT
SYR 10ML LL (SYRINGE) ×3 IMPLANT
SYR BULB IRRIG 60ML STRL (SYRINGE) ×3 IMPLANT
TAPE TRANSPORE STRL 2 31045 (GAUZE/BANDAGES/DRESSINGS) IMPLANT
WATER STERILE IRR 1000ML POUR (IV SOLUTION) ×3 IMPLANT

## 2017-08-16 NOTE — Anesthesia Procedure Notes (Signed)
Procedure Name: LMA Insertion Date/Time: 08/16/2017 10:51 AM Performed by: Doreen Salvage, CRNA Pre-anesthesia Checklist: Patient identified, Patient being monitored, Timeout performed, Emergency Drugs available and Suction available Patient Re-evaluated:Patient Re-evaluated prior to induction Oxygen Delivery Method: Circle system utilized Preoxygenation: Pre-oxygenation with 100% oxygen Induction Type: IV induction Ventilation: Mask ventilation without difficulty LMA: LMA inserted LMA Size: 3.5 Tube type: Oral Number of attempts: 1 Placement Confirmation: positive ETCO2 and breath sounds checked- equal and bilateral Tube secured with: Tape Dental Injury: Teeth and Oropharynx as per pre-operative assessment

## 2017-08-16 NOTE — Anesthesia Post-op Follow-up Note (Signed)
Anesthesia QCDR form completed.        

## 2017-08-16 NOTE — Anesthesia Procedure Notes (Signed)
Procedure Name: Intubation Date/Time: 08/16/2017 10:56 AM Performed by: Doreen Salvage, CRNA Pre-anesthesia Checklist: Patient identified, Patient being monitored, Timeout performed, Emergency Drugs available and Suction available Patient Re-evaluated:Patient Re-evaluated prior to induction Oxygen Delivery Method: Circle system utilized Preoxygenation: Pre-oxygenation with 100% oxygen Induction Type: IV induction Ventilation: Mask ventilation without difficulty Laryngoscope Size: Mac and 3 Grade View: Grade I Tube type: Oral Tube size: 7.0 mm Number of attempts: 1 Airway Equipment and Method: Stylet Placement Confirmation: ETT inserted through vocal cords under direct vision,  positive ETCO2 and breath sounds checked- equal and bilateral Secured at: 21 cm Tube secured with: Tape Dental Injury: Teeth and Oropharynx as per pre-operative assessment  Comments: LMA removed. Pt intubated with ease

## 2017-08-16 NOTE — OR Nursing (Signed)
Dr. Bary Castilla in to see pt on arrival to postop.  Advises he does not need to return to postop again prior to patient's discharge.

## 2017-08-16 NOTE — Anesthesia Preprocedure Evaluation (Addendum)
Anesthesia Evaluation  Patient identified by MRN, date of birth, ID band  Reviewed: Allergy & Precautions, NPO status , Patient's Chart, lab work & pertinent test results, reviewed documented beta blocker date and time   History of Anesthesia Complications (+) PONV and history of anesthetic complications  Airway Mallampati: III       Dental   Pulmonary asthma , sleep apnea (not using CPAP) ,           Cardiovascular hypertension, Pt. on medications and Pt. on home beta blockers (-) Past MI and (-) CHF (-) dysrhythmias + Valvular Problems/Murmurs      Neuro/Psych neg Seizures Anxiety Depression TIA   GI/Hepatic Neg liver ROS, GERD  Medicated and Controlled,  Endo/Other  diabetes, Oral Hypoglycemic Agents  Renal/GU Renal InsufficiencyRenal disease     Musculoskeletal   Abdominal   Peds  Hematology  (+) anemia ,   Anesthesia Other Findings   Reproductive/Obstetrics                            Anesthesia Physical Anesthesia Plan  ASA: III  Anesthesia Plan: General   Post-op Pain Management:    Induction: Intravenous  PONV Risk Score and Plan: 4 or greater and Dexamethasone, Ondansetron, Midazolam and Scopolamine patch - Pre-op  Airway Management Planned: Oral ETT  Additional Equipment:   Intra-op Plan:   Post-operative Plan:   Informed Consent: I have reviewed the patients History and Physical, chart, labs and discussed the procedure including the risks, benefits and alternatives for the proposed anesthesia with the patient or authorized representative who has indicated his/her understanding and acceptance.     Plan Discussed with:   Anesthesia Plan Comments:         Anesthesia Quick Evaluation

## 2017-08-16 NOTE — Anesthesia Postprocedure Evaluation (Signed)
Anesthesia Post Note  Patient: Ann Cummings  Procedure(s) Performed: BREAST LUMPECTOMY WITH SENTINEL LYMPH NODE BX (Left ) SENTINEL NODE BIOPSY (Left )  Patient location during evaluation: PACU Anesthesia Type: General Level of consciousness: awake and alert Pain management: pain level controlled Vital Signs Assessment: post-procedure vital signs reviewed and stable Respiratory status: spontaneous breathing and respiratory function stable Cardiovascular status: stable Anesthetic complications: no     Last Vitals:  Vitals:   08/16/17 1317 08/16/17 1330  BP: (!) 114/47 135/85  Pulse: (!) 53 (!) 59  Resp: 16 16  Temp: 36.9 C 36.7 C  SpO2: 94% 96%    Last Pain:  Vitals:   08/16/17 1359  TempSrc:   PainSc: 6                  Dauntae Derusha K

## 2017-08-16 NOTE — Telephone Encounter (Signed)
PA done at [email or web address] for pt's flexeril, I will await a response

## 2017-08-16 NOTE — Transfer of Care (Signed)
Immediate Anesthesia Transfer of Care Note  Patient: Ann Cummings  Procedure(s) Performed: Procedure(s): BREAST LUMPECTOMY WITH SENTINEL LYMPH NODE BX (Left) SENTINEL NODE BIOPSY (Left)  Patient Location: PACU  Anesthesia Type:General  Level of Consciousness: sedated  Airway & Oxygen Therapy: Patient Spontanous Breathing and Patient connected to face mask oxygen  Post-op Assessment: Report given to RN and Post -op Vital signs reviewed and stable  Post vital signs: Reviewed and stable  Last Vitals:  Vitals:   08/16/17 1232  BP: (!) 109/46  Pulse: (!) 52  Resp: 14  Temp: 36.8 C  SpO2: 85%    Complications: No apparent anesthesia complications

## 2017-08-16 NOTE — H&P (Signed)
Tolerated SLN injection and needle localization well.  For left wide excision, SLN biopsy.

## 2017-08-16 NOTE — Op Note (Signed)
Preoperative diagnosis: Left upper outer quadrant breast cancer.  Postoperative diagnosis: Same.  Operative procedure: Left breast wide excision with ultrasound and wire localization, sentinel node biopsy.  Operating Surgeon: Hervey Ard, MD.  Anesthesia: General endotracheal.  Marcaine 0.5% with 1-200,000 units of epinephrine, 30 cc.  Estimated blood loss: Less than 5 cc.  Clinical note: This 77 year old woman underwent 3 biopsies of the left breast with one identifying invasive mammary carcinoma.  She desired breast conservation.  She was injected with technetium sulfur colloid prior to presentation of the operating room.  Operative note: With the patient under adequate general endotracheal anesthesia the breast was prepped with alcohol and 5 cc of 0.5% methylene blue was infiltrated in the subareolar plexus.  The breast chest and axilla was then cleansed with ChloraPrep and draped.  Ultrasound was used to follow the localizing wire down to the tumor mass in the 2 o'clock position approximately 5 cm from the nipple.  The margins were outlined.  A curvilinear incision in the upper outer quadrant was made after instillation of local anesthetic for postoperative analgesia.  The skin was incised sharply and remaining dissection completed with electrocautery.  Approximately 2 cm of adipose tissue was divided and then the wide excision was undertaken to include a 3 x 3 x 4 cm block of tissue.  It was orientated, specimen radiograph confirmed 2 of the 3 previously placed clips and telephone report from pathology was the margins were grossly negative.  While the breast pathology was pending attention was turned to the axilla.  Fairly low counts of approximately 100 and the lower aspect of the axillary window.  A transverse incision was made carried down to the skin subtendinous tissue after installation of local anesthetic.  A single hot, blue lymph node with counts of approximately 350 were  identified.  No additional axillary increased uptake was noted.  The axillary wound was closed in layers with 2-0 Vicryl figure-of-eight sutures to the axillary envelope and then to the adipose tissue.  The skin was closed with a running 4-0 Vicryl subcuticular suture.  Attention was turned to closing the breast wound after the pathology report had been received.  The breast tissue was elevated off the underlying pectoralis muscle with electrocautery and the muscle fascia approximated with interrupted 2-0 Vicryl figure-of-eight sutures.  The deep layer of the adipose tissue was then approximated with interrupted 2-0 Vicryl figure-of-eight sutures.  The superficial layer of the adipose tissue was approximated with interrupted 2-0 Vicryl sutures and the skin closed with a running 4-0 Vicryl subcuticular suture.  The inferior medial aspect of the skin was elevated slightly with cautery is a thin flap to smooth the apposition during the subcuticular closure.  Benzoin and Steri-Strips followed by Telfa gauze, fluff gauze and a surgical bra were applied.  Patient tolerated the procedure well and was taken recovery room in stable condition.

## 2017-08-16 NOTE — Discharge Instructions (Signed)

## 2017-08-17 ENCOUNTER — Telehealth: Payer: Self-pay | Admitting: Family Medicine

## 2017-08-17 NOTE — Telephone Encounter (Signed)
Copied from [place]. Topic: Quick Communication - See Telephone Encounter >> Aug 17, 2017 11:28 AM Percell Belt A wrote: CRM for notification. See Telephone encounter for: 08/17/17. [employer] called , cyclobenzaprine (FLEXERIL) 10 MG tablet [[id number]] has been approved for 1 year

## 2017-08-17 NOTE — Telephone Encounter (Signed)
Left VM letting pharmacy know PA was approved

## 2017-08-18 LAB — SURGICAL PATHOLOGY

## 2017-08-19 ENCOUNTER — Telehealth: Payer: Self-pay | Admitting: General Surgery

## 2017-08-19 NOTE — Telephone Encounter (Signed)
The patient was notified that the pathology report was clear.  She reports some discomfort of the axillary incision.  Gentle use of padding encouraged.  She reports some clear drainage from the wound.  Minimal pain.  She will begin showering as requested.  Follow-up next week as planned.

## 2017-08-20 ENCOUNTER — Ambulatory Visit: Payer: Medicare Other | Admitting: Family Medicine

## 2017-08-24 ENCOUNTER — Ambulatory Visit (INDEPENDENT_AMBULATORY_CARE_PROVIDER_SITE_OTHER): Payer: Medicare Other | Admitting: General Surgery

## 2017-08-24 ENCOUNTER — Ambulatory Visit (INDEPENDENT_AMBULATORY_CARE_PROVIDER_SITE_OTHER): Payer: Medicare Other

## 2017-08-24 ENCOUNTER — Encounter: Payer: Self-pay | Admitting: General Surgery

## 2017-08-24 VITALS — BP 140/78 | HR 72 | Resp 16 | Ht 64.0 in | Wt 215.0 lb

## 2017-08-24 DIAGNOSIS — Z170 Estrogen receptor positive status [ER+]: Secondary | ICD-10-CM | POA: Diagnosis not present

## 2017-08-24 DIAGNOSIS — C50412 Malignant neoplasm of upper-outer quadrant of left female breast: Secondary | ICD-10-CM | POA: Diagnosis not present
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
## 2017-08-24 NOTE — Patient Instructions (Addendum)
Patient needs to see Dr. Baruch Gouty . The patient is aware to call back for any questions or concerns.  The patient is scheduled to see Dr Baruch Gouty at [facility] on 08/31/17 at 2:30 pm.

## 2017-08-26 ENCOUNTER — Encounter: Payer: Self-pay | Admitting: Diagnostic Radiology

## 2017-08-31 ENCOUNTER — Ambulatory Visit
Admission: RE | Admit: 2017-08-31 | Discharge: 2017-08-31 | Disposition: A | Discharge status: Home / Self Care | Payer: Medicare Other | Source: Ambulatory Visit | Attending: Radiation Oncology | Admitting: Radiation Oncology

## 2017-08-31 ENCOUNTER — Encounter: Payer: Self-pay | Admitting: Radiation Oncology

## 2017-08-31 ENCOUNTER — Other Ambulatory Visit: Payer: Self-pay

## 2017-08-31 VITALS — BP 158/94 | HR 57 | Temp 97.0°F | Resp 18 | Wt 215.5 lb

## 2017-08-31 DIAGNOSIS — Z8673 Personal history of transient ischemic attack (TIA), and cerebral infarction without residual deficits: Secondary | ICD-10-CM | POA: Insufficient documentation

## 2017-08-31 DIAGNOSIS — E119 Type 2 diabetes mellitus without complications: Secondary | ICD-10-CM | POA: Insufficient documentation

## 2017-08-31 DIAGNOSIS — Z79899 Other long term (current) drug therapy: Secondary | ICD-10-CM | POA: Diagnosis not present

## 2017-08-31 DIAGNOSIS — C50412 Malignant neoplasm of upper-outer quadrant of left female breast: Secondary | ICD-10-CM | POA: Diagnosis not present

## 2017-08-31 DIAGNOSIS — G4730 Sleep apnea, unspecified: Secondary | ICD-10-CM | POA: Diagnosis not present

## 2017-08-31 DIAGNOSIS — I10 Essential (primary) hypertension: Secondary | ICD-10-CM | POA: Insufficient documentation

## 2017-08-31 DIAGNOSIS — Z85828 Personal history of other malignant neoplasm of skin: Secondary | ICD-10-CM | POA: Diagnosis not present

## 2017-08-31 DIAGNOSIS — Z86010 Personal history of colonic polyps: Secondary | ICD-10-CM | POA: Insufficient documentation

## 2017-08-31 DIAGNOSIS — E785 Hyperlipidemia, unspecified: Secondary | ICD-10-CM | POA: Diagnosis not present

## 2017-08-31 DIAGNOSIS — F419 Anxiety disorder, unspecified: Secondary | ICD-10-CM | POA: Diagnosis not present

## 2017-08-31 DIAGNOSIS — R011 Cardiac murmur, unspecified: Secondary | ICD-10-CM | POA: Insufficient documentation

## 2017-08-31 DIAGNOSIS — M1990 Unspecified osteoarthritis, unspecified site: Secondary | ICD-10-CM | POA: Diagnosis not present

## 2017-08-31 DIAGNOSIS — Z170 Estrogen receptor positive status [ER+]: Secondary | ICD-10-CM | POA: Insufficient documentation

## 2017-08-31 DIAGNOSIS — E669 Obesity, unspecified: Secondary | ICD-10-CM | POA: Insufficient documentation

## 2017-08-31 DIAGNOSIS — Z8041 Family history of malignant neoplasm of ovary: Secondary | ICD-10-CM | POA: Diagnosis not present

## 2017-08-31 DIAGNOSIS — K219 Gastro-esophageal reflux disease without esophagitis: Secondary | ICD-10-CM | POA: Insufficient documentation

## 2017-08-31 DIAGNOSIS — Z8619 Personal history of other infectious and parasitic diseases: Secondary | ICD-10-CM | POA: Insufficient documentation

## 2017-08-31 DIAGNOSIS — D649 Anemia, unspecified: Secondary | ICD-10-CM | POA: Insufficient documentation
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
## 2017-09-01 ENCOUNTER — Telehealth: Payer: Self-pay | Admitting: *Deleted

## 2017-09-01 MED ORDER — SULFAMETHOXAZOLE-TRIMETHOPRIM 800-160 MG PO TABS: 1.0000 | ORAL_TABLET | Freq: Two times a day (BID) | ORAL | 0 refills | Status: DC

## 2017-09-01 NOTE — Telephone Encounter (Signed)
Mammosite schedule reviewed with the patient Placement   09-15-17   at [facility] at 9:00 Scan 09-16-17 Treat May 3, 6-9 Aware the [place] will be calling her for more details Aware of ATB and directions reviewed. Aware no showers and to wear her bra while mammosite in place. Pt agrees.

## 2017-09-06 ENCOUNTER — Encounter: Payer: Self-pay | Admitting: General Surgery

## 2017-09-06 DIAGNOSIS — C50412 Malignant neoplasm of upper-outer quadrant of left female breast: Secondary | ICD-10-CM | POA: Diagnosis not present

## 2017-09-06 DIAGNOSIS — Z170 Estrogen receptor positive status [ER+]: Secondary | ICD-10-CM | POA: Diagnosis not present

## 2017-09-07 ENCOUNTER — Telehealth: Payer: Self-pay | Admitting: General Surgery

## 2017-09-07 NOTE — Telephone Encounter (Signed)
The patient was notified that her Oncotype DX testing showed less than 1% benefit from adjuvant chemotherapy.  She has met with radiation oncology and is scheduled for MammoSite balloon placement on Wednesday, Sep 15, 2017.  She was reminded to take her antibiotics prior to the procedure as planned.  She expressed some reservation about taking an anti-hormone pill.  I told her in part that the recommendation against chemotherapy is based on the plans for antiestrogen therapy.  We will discuss in more detail at follow-up.

## 2017-09-08 ENCOUNTER — Encounter: Payer: Self-pay | Admitting: Family Medicine

## 2017-09-08 ENCOUNTER — Ambulatory Visit (INDEPENDENT_AMBULATORY_CARE_PROVIDER_SITE_OTHER): Payer: Medicare Other | Admitting: Family Medicine

## 2017-09-08 ENCOUNTER — Encounter: Payer: Self-pay | Admitting: General Surgery

## 2017-09-08 VITALS — BP 130/70 | HR 66 | Temp 99.4°F | Ht 64.0 in | Wt 214.8 lb

## 2017-09-08 DIAGNOSIS — N289 Disorder of kidney and ureter, unspecified: Secondary | ICD-10-CM | POA: Diagnosis not present

## 2017-09-08 DIAGNOSIS — E119 Type 2 diabetes mellitus without complications: Secondary | ICD-10-CM | POA: Diagnosis not present

## 2017-09-08 DIAGNOSIS — E7800 Pure hypercholesterolemia, unspecified: Secondary | ICD-10-CM | POA: Diagnosis not present

## 2017-09-08 DIAGNOSIS — I10 Essential (primary) hypertension: Secondary | ICD-10-CM | POA: Diagnosis not present

## 2017-09-08 DIAGNOSIS — Z6836 Body mass index (BMI) 36.0-36.9, adult: Secondary | ICD-10-CM

## 2017-09-08 LAB — COMPREHENSIVE METABOLIC PANEL
ALBUMIN: 3.7 g/dL (ref 3.5–5.2)
ALT: 10 U/L (ref 0–35)
AST: 16 U/L (ref 0–37)
Alkaline Phosphatase: 48 U/L (ref 39–117)
BUN: 16 mg/dL (ref 6–23)
CHLORIDE: 103 meq/L (ref 96–112)
CO2: 30 mEq/L (ref 19–32)
CREATININE: 1.1 mg/dL (ref 0.40–1.20)
Calcium: 9 mg/dL (ref 8.4–10.5)
GFR: 51.43 mL/min — ABNORMAL LOW (ref >60.00)
Glucose, Bld: 93 mg/dL (ref 70–99)
Potassium: 4.7 mEq/L (ref 3.5–5.1)
SODIUM: 137 meq/L (ref 135–145)
Total Bilirubin: 0.4 mg/dL (ref 0.2–1.2)
Total Protein: 6.4 g/dL (ref 6.0–8.3)

## 2017-09-08 LAB — LIPID PANEL
CHOL/HDL RATIO: 3
CHOLESTEROL: 129 mg/dL (ref 0–200)
HDL: 41.5 mg/dL (ref >39.00)
NonHDL: 87.52
Triglycerides: 251 mg/dL — ABNORMAL HIGH (ref 0.0–149.0)
VLDL: 50.2 mg/dL — ABNORMAL HIGH (ref 0.0–40.0)

## 2017-09-08 LAB — HEMOGLOBIN A1C: Hgb A1c MFr Bld: 5.9 % (ref 4.6–6.5)

## 2017-09-08 LAB — LDL CHOLESTEROL, DIRECT: LDL DIRECT: 55 mg/dL

## 2017-09-08 MED ORDER — CLOTRIMAZOLE-BETAMETHASONE 1-0.05 % EX CREA: 1.0000 "application " | TOPICAL_CREAM | Freq: Two times a day (BID) | CUTANEOUS | 1 refills | Status: DC | PRN

## 2017-09-08 MED ORDER — GLUCOSE BLOOD VI STRP
ORAL_STRIP | 3 refills | Status: DC
Start: 2017-09-08 — End: 2021-02-13

## 2017-09-08 NOTE — Patient Instructions (Addendum)
Don't forget to set up your eye exam-- ? Due in June   For blood sugar control Try to get most of your carbohydrates from produce (with the exception of white potatoes)  Eat less bread/pasta/rice/snack foods/cereals/sweets and other items from the middle of the grocery store (processed carbs)   Follow up in 6 months for annual exam
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
## 2017-09-09 NOTE — Assessment & Plan Note (Signed)
bp in fair control at this time  BP Readings from Last 1 Encounters:  09/08/17 130/70   No changes needed Disc lifstyle change with low sodium diet and exercise  Labs today

## 2017-09-09 NOTE — Assessment & Plan Note (Signed)
Enc good water intake  Lab today  Avoid renal toxic medicines

## 2017-09-09 NOTE — Assessment & Plan Note (Signed)
Well controlled last check with crestor and diet  Disc goals for lipids and reasons to control them Rev labs with pt Rev low sat fat diet in detail   Lipid panel today

## 2017-09-09 NOTE — Assessment & Plan Note (Signed)
Discussed how this problem influences overall health and the risks it imposes  Reviewed plan for weight loss with lower calorie diet (via better food choices and also portion control or program like weight watchers) and exercise building up to or more than 30 minutes 5 days per week including some aerobic activity    

## 2017-09-09 NOTE — Assessment & Plan Note (Signed)
Pt expects worse control with poor diet lately (stress eating and going through breast cancer tx) Disc low glycemic diet plan  Enc activity as tolerated She will schedule eye exam  Labs incl a1C today

## 2017-09-15 ENCOUNTER — Encounter: Payer: Self-pay | Admitting: General Surgery

## 2017-09-15 ENCOUNTER — Ambulatory Visit: Payer: Self-pay

## 2017-09-15 ENCOUNTER — Ambulatory Visit (INDEPENDENT_AMBULATORY_CARE_PROVIDER_SITE_OTHER): Payer: Medicare Other | Admitting: General Surgery

## 2017-09-15 VITALS — BP 130/58 | Resp 16 | Ht 64.0 in | Wt 213.0 lb

## 2017-09-15 DIAGNOSIS — Z170 Estrogen receptor positive status [ER+]: Secondary | ICD-10-CM

## 2017-09-15 DIAGNOSIS — C50412 Malignant neoplasm of upper-outer quadrant of left female breast: Secondary | ICD-10-CM

## 2017-09-15 NOTE — Patient Instructions (Signed)
The patient is aware to call back for any questions or concerns. Patient care kit given to patient.  Instructed no showers, sponge bath while mammosite in place, take antibiotic. Follow up with [place] as arranged. Discussed wearing your bra for support at all times.
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
## 2017-09-16 ENCOUNTER — Ambulatory Visit
Admission: RE | Admit: 2017-09-16 | Discharge: 2017-09-16 | Disposition: A | Discharge status: Home / Self Care | Payer: Medicare Other | Source: Ambulatory Visit | Attending: Radiation Oncology | Admitting: Radiation Oncology

## 2017-09-16 DIAGNOSIS — Z510 Encounter for antineoplastic radiation therapy: Secondary | ICD-10-CM | POA: Diagnosis not present

## 2017-09-16 DIAGNOSIS — C50412 Malignant neoplasm of upper-outer quadrant of left female breast: Secondary | ICD-10-CM | POA: Insufficient documentation

## 2017-09-16 DIAGNOSIS — Z170 Estrogen receptor positive status [ER+]: Secondary | ICD-10-CM | POA: Insufficient documentation

## 2017-09-17 ENCOUNTER — Ambulatory Visit
Admission: RE | Admit: 2017-09-17 | Discharge: 2017-09-17 | Disposition: A | Discharge status: Home / Self Care | Payer: Medicare Other | Source: Ambulatory Visit | Attending: Radiation Oncology | Admitting: Radiation Oncology

## 2017-09-17 DIAGNOSIS — Z170 Estrogen receptor positive status [ER+]: Secondary | ICD-10-CM | POA: Diagnosis not present

## 2017-09-17 DIAGNOSIS — C50412 Malignant neoplasm of upper-outer quadrant of left female breast: Secondary | ICD-10-CM | POA: Diagnosis not present

## 2017-09-17 DIAGNOSIS — Z510 Encounter for antineoplastic radiation therapy: Secondary | ICD-10-CM | POA: Diagnosis not present

## 2017-09-20 ENCOUNTER — Ambulatory Visit
Admission: RE | Admit: 2017-09-20 | Discharge: 2017-09-20 | Disposition: A | Discharge status: Home / Self Care | Payer: Medicare Other | Source: Ambulatory Visit | Attending: Radiation Oncology | Admitting: Radiation Oncology

## 2017-09-20 DIAGNOSIS — C50412 Malignant neoplasm of upper-outer quadrant of left female breast: Secondary | ICD-10-CM | POA: Diagnosis not present

## 2017-09-20 DIAGNOSIS — Z170 Estrogen receptor positive status [ER+]: Secondary | ICD-10-CM | POA: Diagnosis not present

## 2017-09-20 DIAGNOSIS — Z510 Encounter for antineoplastic radiation therapy: Secondary | ICD-10-CM | POA: Diagnosis not present

## 2017-09-21 ENCOUNTER — Ambulatory Visit
Admission: RE | Admit: 2017-09-21 | Discharge: 2017-09-21 | Disposition: A | Discharge status: Home / Self Care | Payer: Medicare Other | Source: Ambulatory Visit | Attending: Radiation Oncology | Admitting: Radiation Oncology

## 2017-09-21 DIAGNOSIS — Z170 Estrogen receptor positive status [ER+]: Secondary | ICD-10-CM | POA: Diagnosis not present

## 2017-09-21 DIAGNOSIS — Z510 Encounter for antineoplastic radiation therapy: Secondary | ICD-10-CM | POA: Diagnosis not present

## 2017-09-21 DIAGNOSIS — C50412 Malignant neoplasm of upper-outer quadrant of left female breast: Secondary | ICD-10-CM | POA: Diagnosis not present

## 2017-09-22 ENCOUNTER — Ambulatory Visit
Admission: RE | Admit: 2017-09-22 | Discharge: 2017-09-22 | Disposition: A | Discharge status: Home / Self Care | Payer: Medicare Other | Source: Ambulatory Visit | Attending: Radiation Oncology | Admitting: Radiation Oncology

## 2017-09-22 DIAGNOSIS — C50412 Malignant neoplasm of upper-outer quadrant of left female breast: Secondary | ICD-10-CM | POA: Diagnosis not present

## 2017-09-22 DIAGNOSIS — Z923 Personal history of irradiation: Secondary | ICD-10-CM

## 2017-09-22 DIAGNOSIS — Z510 Encounter for antineoplastic radiation therapy: Secondary | ICD-10-CM | POA: Diagnosis not present

## 2017-09-22 DIAGNOSIS — Z170 Estrogen receptor positive status [ER+]: Secondary | ICD-10-CM | POA: Diagnosis not present

## 2017-09-22 HISTORY — DX: Personal history of irradiation: Z92.3

## 2017-09-23 ENCOUNTER — Ambulatory Visit
Admission: RE | Admit: 2017-09-23 | Discharge: 2017-09-23 | Disposition: A | Discharge status: Home / Self Care | Payer: Medicare Other | Source: Ambulatory Visit | Attending: Radiation Oncology | Admitting: Radiation Oncology

## 2017-09-23 DIAGNOSIS — Z510 Encounter for antineoplastic radiation therapy: Secondary | ICD-10-CM | POA: Diagnosis not present

## 2017-09-23 DIAGNOSIS — C50412 Malignant neoplasm of upper-outer quadrant of left female breast: Secondary | ICD-10-CM | POA: Diagnosis not present

## 2017-09-23 DIAGNOSIS — Z170 Estrogen receptor positive status [ER+]: Secondary | ICD-10-CM | POA: Diagnosis not present

## 2017-09-28 ENCOUNTER — Encounter: Payer: Self-pay | Admitting: General Surgery

## 2017-09-28 ENCOUNTER — Telehealth: Payer: Self-pay | Admitting: *Deleted

## 2017-09-28 ENCOUNTER — Ambulatory Visit (INDEPENDENT_AMBULATORY_CARE_PROVIDER_SITE_OTHER): Payer: Medicare Other | Admitting: General Surgery

## 2017-09-28 VITALS — BP 116/50 | HR 71 | Resp 18 | Ht 64.0 in | Wt 209.0 lb

## 2017-09-28 DIAGNOSIS — Z170 Estrogen receptor positive status [ER+]: Secondary | ICD-10-CM

## 2017-09-28 DIAGNOSIS — C50412 Malignant neoplasm of upper-outer quadrant of left female breast: Secondary | ICD-10-CM

## 2017-09-28 DIAGNOSIS — Z780 Asymptomatic menopausal state: Secondary | ICD-10-CM

## 2017-09-28 NOTE — Patient Instructions (Addendum)
Patient to return in six weeks.  Patient to have a bone density test done.  The patient is aware to use a heating pad as needed for comfort.  The patient is scheduled for a bone density test at [facility] on 11/01/17 at 10:20 am.
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
## 2017-09-28 NOTE — Telephone Encounter (Signed)
Called and informed patient of her follow-up appointment with Dr. Tasia Catchings on 10/13/17 @ 10:00.

## 2017-10-13 ENCOUNTER — Encounter: Payer: Self-pay | Admitting: Oncology

## 2017-10-13 ENCOUNTER — Other Ambulatory Visit: Payer: Self-pay

## 2017-10-13 ENCOUNTER — Inpatient Hospital Stay: Payer: Medicare Other | Attending: Oncology | Admitting: Oncology

## 2017-10-13 VITALS — BP 124/57 | HR 51 | Temp 97.7°F | Resp 18 | Wt 211.5 lb

## 2017-10-13 DIAGNOSIS — Z79899 Other long term (current) drug therapy: Secondary | ICD-10-CM | POA: Insufficient documentation

## 2017-10-13 DIAGNOSIS — I10 Essential (primary) hypertension: Secondary | ICD-10-CM | POA: Insufficient documentation

## 2017-10-13 DIAGNOSIS — Z8673 Personal history of transient ischemic attack (TIA), and cerebral infarction without residual deficits: Secondary | ICD-10-CM | POA: Insufficient documentation

## 2017-10-13 DIAGNOSIS — M1990 Unspecified osteoarthritis, unspecified site: Secondary | ICD-10-CM | POA: Insufficient documentation

## 2017-10-13 DIAGNOSIS — G4730 Sleep apnea, unspecified: Secondary | ICD-10-CM | POA: Diagnosis not present

## 2017-10-13 DIAGNOSIS — D649 Anemia, unspecified: Secondary | ICD-10-CM | POA: Diagnosis not present

## 2017-10-13 DIAGNOSIS — E669 Obesity, unspecified: Secondary | ICD-10-CM | POA: Insufficient documentation

## 2017-10-13 DIAGNOSIS — K589 Irritable bowel syndrome without diarrhea: Secondary | ICD-10-CM | POA: Insufficient documentation

## 2017-10-13 DIAGNOSIS — C50412 Malignant neoplasm of upper-outer quadrant of left female breast: Secondary | ICD-10-CM

## 2017-10-13 DIAGNOSIS — Z170 Estrogen receptor positive status [ER+]: Secondary | ICD-10-CM

## 2017-10-13 DIAGNOSIS — F329 Major depressive disorder, single episode, unspecified: Secondary | ICD-10-CM | POA: Diagnosis not present

## 2017-10-13 DIAGNOSIS — J45909 Unspecified asthma, uncomplicated: Secondary | ICD-10-CM | POA: Diagnosis not present

## 2017-10-13 DIAGNOSIS — Z8619 Personal history of other infectious and parasitic diseases: Secondary | ICD-10-CM | POA: Diagnosis not present

## 2017-10-13 DIAGNOSIS — E119 Type 2 diabetes mellitus without complications: Secondary | ICD-10-CM | POA: Diagnosis not present

## 2017-10-13 DIAGNOSIS — Z85828 Personal history of other malignant neoplasm of skin: Secondary | ICD-10-CM | POA: Insufficient documentation

## 2017-10-13 DIAGNOSIS — Z7189 Other specified counseling: Secondary | ICD-10-CM

## 2017-10-13 DIAGNOSIS — K219 Gastro-esophageal reflux disease without esophagitis: Secondary | ICD-10-CM | POA: Diagnosis not present

## 2017-10-13 DIAGNOSIS — F419 Anxiety disorder, unspecified: Secondary | ICD-10-CM | POA: Insufficient documentation

## 2017-10-13 DIAGNOSIS — E785 Hyperlipidemia, unspecified: Secondary | ICD-10-CM | POA: Diagnosis not present
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
## 2017-10-13 NOTE — Progress Notes (Signed)
Patient here for follow up. No concerns voiced.  °

## 2017-10-14 DIAGNOSIS — Z7189 Other specified counseling: Secondary | ICD-10-CM | POA: Insufficient documentation

## 2017-10-28 ENCOUNTER — Encounter: Payer: Self-pay | Admitting: Radiation Oncology

## 2017-10-28 ENCOUNTER — Other Ambulatory Visit: Payer: Self-pay

## 2017-10-28 ENCOUNTER — Ambulatory Visit
Admission: RE | Admit: 2017-10-28 | Discharge: 2017-10-28 | Disposition: A | Discharge status: Home / Self Care | Payer: Medicare Other | Source: Ambulatory Visit | Attending: Radiation Oncology | Admitting: Radiation Oncology

## 2017-10-28 ENCOUNTER — Ambulatory Visit: Payer: Medicare Other | Admitting: Radiation Oncology

## 2017-10-28 VITALS — BP 152/65 | HR 59 | Temp 98.7°F | Resp 18 | Wt 208.7 lb

## 2017-10-28 DIAGNOSIS — C50912 Malignant neoplasm of unspecified site of left female breast: Secondary | ICD-10-CM | POA: Diagnosis not present

## 2017-10-28 DIAGNOSIS — Z170 Estrogen receptor positive status [ER+]: Secondary | ICD-10-CM | POA: Insufficient documentation

## 2017-10-28 DIAGNOSIS — Z923 Personal history of irradiation: Secondary | ICD-10-CM | POA: Insufficient documentation

## 2017-10-28 DIAGNOSIS — C50412 Malignant neoplasm of upper-outer quadrant of left female breast: Secondary | ICD-10-CM

## 2017-10-28 NOTE — Progress Notes (Signed)
Radiation Oncology Follow up Note  Name: Ann Cummings   Date:   10/28/2017 MRN:  [id number] DOB: 02/03/1941    This 77 y.o. female presents to the clinic today for one-month follow-up status post accelerated partial breast radiation to her left breast for stage I ER/PR positive invasive mammary carcinoma.  REFERRING PROVIDER: Tower, [name], MD  HPI: patient is a 77 year old female now seen out 1 month having completed accelerated partial breast radiation to her left breast for stage I ER/PR positive HER-2/neu negative invasive mammary carcinoma status post wide local excision. Seen today in routine follow-up she is doing well. She specifically denies breast tenderness cough or bone pain. There is some tenderness in the lumpectomy site along with some fullness which is to be expected.She has been offered antiestrogen therapy although has declined. She is otherwise doing well..  COMPLICATIONS OF TREATMENT: none  FOLLOW UP COMPLIANCE: keeps appointments   PHYSICAL EXAM:  BP (!) 152/65   Pulse (!) 59   Temp 98.7 F (37.1 C)   Resp 18   Wt 208 lb 10.7 oz (94.7 kg)   BMI 35.82 kg/m  Lungs are clear to A&P cardiac examination essentially unremarkable with regular rate and rhythm. No dominant mass or nodularity is noted in either breast in 2 positions examined. Incision is well-healed. No axillary or supraclavicular adenopathy is appreciated. Cosmetic result is excellent. Well-developed well-nourished patient in NAD. HEENT reveals PERLA, EOMI, discs not visualized.  Oral cavity is clear. No oral mucosal lesions are identified. Neck is clear without evidence of cervical or supraclavicular adenopathy. Lungs are clear to A&P. Cardiac examination is essentially unremarkable with regular rate and rhythm without murmur rub or thrill. Abdomen is benign with no organomegaly or masses noted. Motor sensory and DTR levels are equal and symmetric in the upper and lower extremities. Cranial nerves II through  XII are grossly intact. Proprioception is intact. No peripheral adenopathy or edema is identified. No motor or sensory levels are noted. Crude visual fields are within normal range.  RADIOLOGY RESULTS: no current films for review  PLAN: present time patient is doing well 1 month out from accelerated partial breast irradiation. I have briefly discussed antiestrogen therapy although the patient continues to decline. I have asked to see her back in 4-5 months for follow-up. She continues close follow-up care with surgery who will be ordering follow-up mammograms.  I would like to take this opportunity to thank you for allowing me to participate in the care of your patient.Noreene Filbert, MD

## 2017-11-01 ENCOUNTER — Ambulatory Visit
Admission: RE | Admit: 2017-11-01 | Discharge: 2017-11-01 | Disposition: A | Discharge status: Home / Self Care | Payer: Medicare Other | Source: Ambulatory Visit | Attending: General Surgery | Admitting: General Surgery

## 2017-11-01 DIAGNOSIS — Z780 Asymptomatic menopausal state: Secondary | ICD-10-CM | POA: Diagnosis not present

## 2017-11-01 DIAGNOSIS — Z170 Estrogen receptor positive status [ER+]: Secondary | ICD-10-CM | POA: Insufficient documentation

## 2017-11-01 DIAGNOSIS — C50412 Malignant neoplasm of upper-outer quadrant of left female breast: Secondary | ICD-10-CM | POA: Insufficient documentation

## 2017-11-01 DIAGNOSIS — Z13820 Encounter for screening for osteoporosis: Secondary | ICD-10-CM | POA: Diagnosis not present

## 2017-11-03 ENCOUNTER — Telehealth: Payer: Self-pay

## 2017-11-03 NOTE — Telephone Encounter (Signed)
-----   Message from Robert Bellow, MD sent at 11/02/2017  9:15 PM EDT ----- Please notify the patient bone density is good.  Be sure she is taking calcium (1200 mg/ day) as well as vitamin D.  ----- Message ----- From: Interface, Rad Results In Sent: 11/01/2017  10:56 AM To: Robert Bellow, MD

## 2017-11-03 NOTE — Telephone Encounter (Signed)
Notified patient as instructed, patient pleased. Will start supplements.

## 2017-11-05 ENCOUNTER — Inpatient Hospital Stay: Payer: Medicare Other | Attending: Oncology | Admitting: Oncology

## 2017-11-05 ENCOUNTER — Other Ambulatory Visit: Payer: Self-pay

## 2017-11-05 ENCOUNTER — Inpatient Hospital Stay: Payer: Medicare Other

## 2017-11-05 ENCOUNTER — Encounter: Payer: Self-pay | Admitting: Oncology

## 2017-11-05 VITALS — BP 160/62 | HR 59 | Temp 97.9°F | Wt 212.0 lb

## 2017-11-05 DIAGNOSIS — C50412 Malignant neoplasm of upper-outer quadrant of left female breast: Secondary | ICD-10-CM | POA: Diagnosis not present

## 2017-11-05 DIAGNOSIS — D649 Anemia, unspecified: Secondary | ICD-10-CM | POA: Insufficient documentation

## 2017-11-05 DIAGNOSIS — Z170 Estrogen receptor positive status [ER+]: Secondary | ICD-10-CM | POA: Diagnosis not present

## 2017-11-05 LAB — COMPREHENSIVE METABOLIC PANEL
ALT: 10 U/L — AB (ref 14–54)
AST: 15 U/L (ref 15–41)
Albumin: 3.6 g/dL (ref 3.5–5.0)
Alkaline Phosphatase: 47 U/L (ref 38–126)
Anion gap: 6 (ref 5–15)
BUN: 24 mg/dL — ABNORMAL HIGH (ref 6–20)
CHLORIDE: 108 mmol/L (ref 101–111)
CO2: 24 mmol/L (ref 22–32)
CREATININE: 1.21 mg/dL — AB (ref 0.44–1.00)
Calcium: 8.9 mg/dL (ref 8.9–10.3)
GFR, EST AFRICAN AMERICAN: 49 mL/min — AB (ref >60)
GFR, EST NON AFRICAN AMERICAN: 43 mL/min — AB (ref >60)
Glucose, Bld: 120 mg/dL — ABNORMAL HIGH (ref 65–99)
Potassium: 3.9 mmol/L (ref 3.5–5.1)
Sodium: 138 mmol/L (ref 135–145)
Total Bilirubin: 0.3 mg/dL (ref 0.3–1.2)
Total Protein: 6.5 g/dL (ref 6.5–8.1)

## 2017-11-05 LAB — CBC WITH DIFFERENTIAL/PLATELET
BASOS ABS: 0 10*3/uL (ref 0–0.1)
BASOS PCT: 1 %
Eosinophils Absolute: 0.3 10*3/uL (ref 0–0.7)
Eosinophils Relative: 3 %
HCT: 30 % — ABNORMAL LOW (ref 35.0–47.0)
HEMOGLOBIN: 10.4 g/dL — AB (ref 12.0–16.0)
LYMPHS ABS: 1.5 10*3/uL (ref 1.0–3.6)
Lymphocytes Relative: 16 %
MCH: 30.6 pg (ref 26.0–34.0)
MCHC: 34.5 g/dL (ref 32.0–36.0)
MCV: 88.6 fL (ref 80.0–100.0)
Monocytes Absolute: 0.8 10*3/uL (ref 0.2–0.9)
Monocytes Relative: 8 %
NEUTROS PCT: 72 %
Neutro Abs: 6.9 10*3/uL — ABNORMAL HIGH (ref 1.4–6.5)
Platelets: 305 10*3/uL (ref 150–440)
RBC: 3.39 MIL/uL — AB (ref 3.80–5.20)
RDW: 14 % (ref 11.5–14.5)
WBC: 9.5 10*3/uL (ref 3.6–11.0)

## 2017-11-05 NOTE — Progress Notes (Signed)
Patient here today for follow up.  Patient states no new concerns today  
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
## 2017-11-17 ENCOUNTER — Other Ambulatory Visit: Payer: Self-pay | Admitting: *Deleted

## 2017-11-17 MED ORDER — DICYCLOMINE HCL 20 MG PO TABS: ORAL_TABLET | ORAL | 0 refills | Status: DC

## 2017-11-30 ENCOUNTER — Encounter: Payer: Self-pay | Admitting: General Surgery

## 2017-11-30 ENCOUNTER — Ambulatory Visit (INDEPENDENT_AMBULATORY_CARE_PROVIDER_SITE_OTHER): Payer: Medicare Other | Admitting: General Surgery

## 2017-11-30 VITALS — BP 152/64 | HR 51 | Resp 16 | Ht 64.0 in | Wt 210.0 lb

## 2017-11-30 DIAGNOSIS — Z170 Estrogen receptor positive status [ER+]: Secondary | ICD-10-CM | POA: Diagnosis not present

## 2017-11-30 DIAGNOSIS — C50412 Malignant neoplasm of upper-outer quadrant of left female breast: Secondary | ICD-10-CM

## 2017-11-30 NOTE — Patient Instructions (Addendum)
The patient is aware to call back for any questions or concerns. The patient has been asked to return to the office in 8 months with a bilateral diagnostic mammogram.
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
## 2018-02-06 ENCOUNTER — Other Ambulatory Visit: Payer: Self-pay | Admitting: Family Medicine

## 2018-02-08 ENCOUNTER — Other Ambulatory Visit: Payer: Self-pay | Admitting: Family Medicine

## 2018-02-10 ENCOUNTER — Other Ambulatory Visit: Payer: Medicare Other

## 2018-02-10 ENCOUNTER — Ambulatory Visit: Payer: Medicare Other | Admitting: Oncology

## 2018-02-13 ENCOUNTER — Telehealth: Payer: Self-pay | Admitting: Family Medicine

## 2018-02-13 DIAGNOSIS — D518 Other vitamin B12 deficiency anemias: Secondary | ICD-10-CM

## 2018-02-13 DIAGNOSIS — I10 Essential (primary) hypertension: Secondary | ICD-10-CM

## 2018-02-13 DIAGNOSIS — E119 Type 2 diabetes mellitus without complications: Secondary | ICD-10-CM

## 2018-02-13 DIAGNOSIS — D508 Other iron deficiency anemias: Secondary | ICD-10-CM

## 2018-02-13 DIAGNOSIS — E7800 Pure hypercholesterolemia, unspecified: Secondary | ICD-10-CM

## 2018-02-13 NOTE — Telephone Encounter (Signed)
-----   Message from Eustace Pen, LPN sent at 4/83/0735  4:29 PM EDT ----- Regarding: Labs 10/3 Lab orders needed. Thank you.  Insurance:  [employer]

## 2018-02-16 ENCOUNTER — Inpatient Hospital Stay: Payer: Medicare Other | Attending: Oncology

## 2018-02-16 ENCOUNTER — Other Ambulatory Visit: Payer: Self-pay

## 2018-02-16 ENCOUNTER — Encounter: Payer: Self-pay | Admitting: Oncology

## 2018-02-16 ENCOUNTER — Inpatient Hospital Stay (HOSPITAL_BASED_OUTPATIENT_CLINIC_OR_DEPARTMENT_OTHER): Payer: Medicare Other | Admitting: Oncology

## 2018-02-16 VITALS — BP 141/59 | HR 51 | Temp 98.0°F | Resp 20 | Wt 208.2 lb

## 2018-02-16 DIAGNOSIS — Z79899 Other long term (current) drug therapy: Secondary | ICD-10-CM | POA: Diagnosis not present

## 2018-02-16 DIAGNOSIS — E119 Type 2 diabetes mellitus without complications: Secondary | ICD-10-CM | POA: Insufficient documentation

## 2018-02-16 DIAGNOSIS — I10 Essential (primary) hypertension: Secondary | ICD-10-CM | POA: Diagnosis not present

## 2018-02-16 DIAGNOSIS — Z923 Personal history of irradiation: Secondary | ICD-10-CM | POA: Diagnosis not present

## 2018-02-16 DIAGNOSIS — Z170 Estrogen receptor positive status [ER+]: Secondary | ICD-10-CM | POA: Insufficient documentation

## 2018-02-16 DIAGNOSIS — Z9221 Personal history of antineoplastic chemotherapy: Secondary | ICD-10-CM | POA: Diagnosis not present

## 2018-02-16 DIAGNOSIS — D631 Anemia in chronic kidney disease: Secondary | ICD-10-CM | POA: Insufficient documentation

## 2018-02-16 DIAGNOSIS — C50412 Malignant neoplasm of upper-outer quadrant of left female breast: Secondary | ICD-10-CM | POA: Diagnosis not present

## 2018-02-16 DIAGNOSIS — D649 Anemia, unspecified: Secondary | ICD-10-CM

## 2018-02-16 DIAGNOSIS — D518 Other vitamin B12 deficiency anemias: Secondary | ICD-10-CM

## 2018-02-16 LAB — IRON AND TIBC
Iron: 48 ug/dL (ref 28–170)
Saturation Ratios: 16 % (ref 10.4–31.8)
TIBC: 307 ug/dL (ref 250–450)
UIBC: 259 ug/dL

## 2018-02-16 LAB — COMPREHENSIVE METABOLIC PANEL
ALBUMIN: 3.6 g/dL (ref 3.5–5.0)
ALK PHOS: 40 U/L (ref 38–126)
ALT: 10 U/L (ref 0–44)
ANION GAP: 9 (ref 5–15)
AST: 16 U/L (ref 15–41)
BILIRUBIN TOTAL: 0.3 mg/dL (ref 0.3–1.2)
BUN: 22 mg/dL (ref 8–23)
CO2: 24 mmol/L (ref 22–32)
Calcium: 8.8 mg/dL — ABNORMAL LOW (ref 8.9–10.3)
Chloride: 107 mmol/L (ref 98–111)
Creatinine, Ser: 1.16 mg/dL — ABNORMAL HIGH (ref 0.44–1.00)
GFR calc Af Amer: 52 mL/min — ABNORMAL LOW (ref >60)
GFR, EST NON AFRICAN AMERICAN: 45 mL/min — AB (ref >60)
Glucose, Bld: 111 mg/dL — ABNORMAL HIGH (ref 70–99)
POTASSIUM: 4.3 mmol/L (ref 3.5–5.1)
Sodium: 140 mmol/L (ref 135–145)
TOTAL PROTEIN: 6.3 g/dL — AB (ref 6.5–8.1)

## 2018-02-16 LAB — CBC WITH DIFFERENTIAL/PLATELET
BASOS ABS: 0 10*3/uL (ref 0–0.1)
Basophils Relative: 0 %
Eosinophils Absolute: 0.3 10*3/uL (ref 0–0.7)
Eosinophils Relative: 4 %
HEMATOCRIT: 29 % — AB (ref 35.0–47.0)
Hemoglobin: 9.9 g/dL — ABNORMAL LOW (ref 12.0–16.0)
LYMPHS PCT: 14 %
Lymphs Abs: 1.2 10*3/uL (ref 1.0–3.6)
MCH: 30.3 pg (ref 26.0–34.0)
MCHC: 34.1 g/dL (ref 32.0–36.0)
MCV: 88.9 fL (ref 80.0–100.0)
Monocytes Absolute: 0.6 10*3/uL (ref 0.2–0.9)
Monocytes Relative: 7 %
NEUTROS ABS: 6.6 10*3/uL — AB (ref 1.4–6.5)
NEUTROS PCT: 75 %
Platelets: 313 10*3/uL (ref 150–440)
RBC: 3.26 MIL/uL — AB (ref 3.80–5.20)
RDW: 13.6 % (ref 11.5–14.5)
WBC: 8.8 10*3/uL (ref 3.6–11.0)

## 2018-02-16 LAB — FERRITIN: Ferritin: 30 ng/mL (ref 11–307)

## 2018-02-16 NOTE — Progress Notes (Signed)
Patient here for follow up. Pt complains of itching all over, she thinks its due to allergies.
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
## 2018-02-17 ENCOUNTER — Telehealth: Payer: Self-pay

## 2018-02-17 ENCOUNTER — Ambulatory Visit (INDEPENDENT_AMBULATORY_CARE_PROVIDER_SITE_OTHER): Payer: Medicare Other

## 2018-02-17 VITALS — BP 150/68 | HR 56 | Temp 99.0°F

## 2018-02-17 DIAGNOSIS — I10 Essential (primary) hypertension: Secondary | ICD-10-CM

## 2018-02-17 DIAGNOSIS — E119 Type 2 diabetes mellitus without complications: Secondary | ICD-10-CM | POA: Diagnosis not present

## 2018-02-17 DIAGNOSIS — E7800 Pure hypercholesterolemia, unspecified: Secondary | ICD-10-CM

## 2018-02-17 DIAGNOSIS — Z0000 Encounter for general adult medical examination without abnormal findings: Secondary | ICD-10-CM

## 2018-02-17 DIAGNOSIS — R21 Rash and other nonspecific skin eruption: Secondary | ICD-10-CM | POA: Insufficient documentation

## 2018-02-17 LAB — LIPID PANEL
Cholesterol: 131 mg/dL (ref 0–200)
HDL: 36 mg/dL — AB (ref >39.00)
NONHDL: 94.6
Total CHOL/HDL Ratio: 4
Triglycerides: 223 mg/dL — ABNORMAL HIGH (ref 0.0–149.0)
VLDL: 44.6 mg/dL — ABNORMAL HIGH (ref 0.0–40.0)

## 2018-02-17 LAB — LDL CHOLESTEROL, DIRECT: Direct LDL: 59 mg/dL

## 2018-02-17 LAB — HEMOGLOBIN A1C: HEMOGLOBIN A1C: 5.7 % (ref 4.6–6.5)

## 2018-02-17 LAB — TSH: TSH: 2.04 u[IU]/mL (ref 0.35–4.50)

## 2018-02-17 MED ORDER — TRIAMCINOLONE ACETONIDE 0.1 % EX CREA: 1.0000 "application " | TOPICAL_CREAM | Freq: Two times a day (BID) | CUTANEOUS | 0 refills | Status: DC

## 2018-02-17 NOTE — Telephone Encounter (Signed)
Right elbow R lower leg  Plaque formation Some rough skin top of abdomen  ? Eczema vs poss psoriasis  Will send in a steroid cream and re eval at her upcoming appt  She is aware
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
## 2018-02-17 NOTE — Telephone Encounter (Signed)
Patient has several areas of skin patches and rashes. PCP was advised and assessed patient.

## 2018-02-17 NOTE — Patient Instructions (Addendum)
Ann Cummings , Thank you for taking time to come for your Medicare Wellness Visit. I appreciate your ongoing commitment to your health goals. Please review the following plan we discussed and let me know if I can assist you in the future.   These are the goals we discussed: Goals    . Patient Stated     Starting 02/17/2018, I will continue to take medications as prescribed.        This is a list of the screening recommended for you and due dates:  Health Maintenance  Topic Date Due  . Flu Shot  03/19/2018*  . Eye exam for diabetics  04/19/2018*  . Tetanus Vaccine  10/13/2026*  . Hemoglobin A1C  08/19/2018  . Complete foot exam   09/09/2018  . Colon Cancer Screening  04/24/2019  . DEXA scan (bone density measurement)  Completed  . Pneumonia vaccines  Completed  *Topic was postponed. The date shown is not the original due date.   Preventive Care for Adults  A healthy lifestyle and preventive care can promote health and wellness. Preventive health guidelines for adults include the following key practices.  . A routine yearly physical is a good way to check with your health care provider about your health and preventive screening. It is a chance to share any concerns and updates on your health and to receive a thorough exam.  . Visit your dentist for a routine exam and preventive care every 6 months. Brush your teeth twice a day and floss once a day. Good oral hygiene prevents tooth decay and gum disease.  . The frequency of eye exams is based on your age, health, family medical history, use  of contact lenses, and other factors. Follow your health care provider's recommendations for frequency of eye exams.  . Eat a healthy diet. Foods like vegetables, fruits, whole grains, low-fat dairy products, and lean protein foods contain the nutrients you need without too many calories. Decrease your intake of foods high in solid fats, added sugars, and salt. Eat the right amount of calories for  you. Get information about a proper diet from your health care provider, if necessary.  . Regular physical exercise is one of the most important things you can do for your health. Most adults should get at least 150 minutes of moderate-intensity exercise (any activity that increases your heart rate and causes you to sweat) each week. In addition, most adults need muscle-strengthening exercises on 2 or more days a week.  Silver Sneakers may be a benefit available to you. To determine eligibility, you may visit the website: www.silversneakers.com or contact program at 218-352-5934 Mon-Fri between 8AM-8PM.   . Maintain a healthy weight. The body mass index (BMI) is a screening tool to identify possible weight problems. It provides an estimate of body fat based on height and weight. Your health care provider can find your BMI and can help you achieve or maintain a healthy weight.   For adults 20 years and older: ? A BMI below 18.5 is considered underweight. ? A BMI of 18.5 to 24.9 is normal. ? A BMI of 25 to 29.9 is considered overweight. ? A BMI of 30 and above is considered obese.   . Maintain normal blood lipids and cholesterol levels by exercising and minimizing your intake of saturated fat. Eat a balanced diet with plenty of fruit and vegetables. Blood tests for lipids and cholesterol should begin at age 67 and be repeated every 5 years. If your lipid  or cholesterol levels are high, you are over 50, or you are at high risk for heart disease, you may need your cholesterol levels checked more frequently. Ongoing high lipid and cholesterol levels should be treated with medicines if diet and exercise are not working.  . If you smoke, find out from your health care provider how to quit. If you do not use tobacco, please do not start.  . If you choose to drink alcohol, please do not consume more than 2 drinks per day. One drink is considered to be 12 ounces (355 mL) of beer, 5 ounces (148 mL) of  wine, or 1.5 ounces (44 mL) of liquor.  . If you are 9-71 years old, ask your health care provider if you should take aspirin to prevent strokes.  . Use sunscreen. Apply sunscreen liberally and repeatedly throughout the day. You should seek shade when your shadow is shorter than you. Protect yourself by wearing long sleeves, pants, a wide-brimmed hat, and sunglasses year round, whenever you are outdoors.  . Once a month, do a whole body skin exam, using a mirror to look at the skin on your back. Tell your health care provider of new moles, moles that have irregular borders, moles that are larger than a pencil eraser, or moles that have changed in shape or color.

## 2018-02-17 NOTE — Progress Notes (Signed)
PCP notes:   Health maintenance:  Flu vaccine - pt will obtain vaccine at CPE Eye exam - addressed A1C - completed  Abnormal screenings:   Mini-Cog score: 19/20 MMSE - Mini Mental State Exam 02/17/2018 02/10/2017 02/03/2016  Orientation to time 5 5 5   Orientation to Place 5 5 5   Registration 3 3 3   Attention/ Calculation 0 0 0  Recall 2 3 3   Recall-comments unable to recall 1 of 3 words - -  Language- name 2 objects 0 0 0  Language- repeat 1 1 1   Language- follow 3 step command 3 3 3   Language- read & follow direction 0 0 0  Write a sentence 0 0 0  Copy design 0 0 0  Total score 19 20 20      Depression score: 2 Depression screen [facility] 2/9 02/17/2018 02/10/2017 02/03/2016 01/09/2014  Decreased Interest 1 1 2  0  Down, Depressed, Hopeless 1 0 1 0  PHQ - 2 Score 2 1 3  0  Altered sleeping 0 1 1 -  Tired, decreased energy 0 3 2 -  Change in appetite 0 2 2 -  Feeling bad or failure about yourself  0 0 1 -  Trouble concentrating 0 0 0 -  Moving slowly or fidgety/restless 0 0 0 -  Suicidal thoughts 0 0 0 -  PHQ-9 Score 2 7 9  -  Difficult doing work/chores Not difficult at all Somewhat difficult Not difficult at all -    Patient concerns:   Patient has dry, reddened patches of skin present to right elbow and right lower extremity as well as small rash to skin under left breast. PCP was notified and subsequently  assessed patient.   Nurse concerns:  None  Next PCP appt:   02/22/18 @ 0930  I reviewed health advisor's note, was available for consultation, and agree with documentation and plan. Loura Pardon MD

## 2018-02-22 ENCOUNTER — Ambulatory Visit (INDEPENDENT_AMBULATORY_CARE_PROVIDER_SITE_OTHER): Payer: Medicare Other | Admitting: Family Medicine

## 2018-02-22 ENCOUNTER — Encounter: Payer: Self-pay | Admitting: Family Medicine

## 2018-02-22 VITALS — BP 130/56 | HR 58 | Temp 99.6°F | Ht 64.0 in | Wt 205.5 lb

## 2018-02-22 DIAGNOSIS — F3289 Other specified depressive episodes: Secondary | ICD-10-CM

## 2018-02-22 DIAGNOSIS — N289 Disorder of kidney and ureter, unspecified: Secondary | ICD-10-CM | POA: Diagnosis not present

## 2018-02-22 DIAGNOSIS — Z6836 Body mass index (BMI) 36.0-36.9, adult: Secondary | ICD-10-CM | POA: Diagnosis not present

## 2018-02-22 DIAGNOSIS — R21 Rash and other nonspecific skin eruption: Secondary | ICD-10-CM

## 2018-02-22 DIAGNOSIS — L2082 Flexural eczema: Secondary | ICD-10-CM

## 2018-02-22 DIAGNOSIS — I10 Essential (primary) hypertension: Secondary | ICD-10-CM

## 2018-02-22 DIAGNOSIS — C50412 Malignant neoplasm of upper-outer quadrant of left female breast: Secondary | ICD-10-CM | POA: Diagnosis not present

## 2018-02-22 DIAGNOSIS — D649 Anemia, unspecified: Secondary | ICD-10-CM | POA: Diagnosis not present

## 2018-02-22 DIAGNOSIS — E7800 Pure hypercholesterolemia, unspecified: Secondary | ICD-10-CM

## 2018-02-22 DIAGNOSIS — R7303 Prediabetes: Secondary | ICD-10-CM | POA: Diagnosis not present

## 2018-02-22 DIAGNOSIS — G43109 Migraine with aura, not intractable, without status migrainosus: Secondary | ICD-10-CM | POA: Diagnosis not present

## 2018-02-22 DIAGNOSIS — Z170 Estrogen receptor positive status [ER+]: Secondary | ICD-10-CM

## 2018-02-22 MED ORDER — OMEPRAZOLE 20 MG PO CPDR: 20.0000 mg | DELAYED_RELEASE_CAPSULE | Freq: Every day | ORAL | 3 refills | Status: DC

## 2018-02-22 MED ORDER — CITALOPRAM HYDROBROMIDE 40 MG PO TABS: 40.0000 mg | ORAL_TABLET | Freq: Every day | ORAL | 3 refills | Status: DC

## 2018-02-22 MED ORDER — ATENOLOL 25 MG PO TABS: 12.5000 mg | ORAL_TABLET | Freq: Every day | ORAL | 3 refills | Status: DC

## 2018-02-22 MED ORDER — CYCLOBENZAPRINE HCL 10 MG PO TABS: ORAL_TABLET | ORAL | 1 refills | Status: DC

## 2018-02-22 MED ORDER — AMLODIPINE BESYLATE 10 MG PO TABS: 10.0000 mg | ORAL_TABLET | Freq: Every day | ORAL | 3 refills | Status: DC

## 2018-02-22 MED ORDER — BENAZEPRIL HCL 20 MG PO TABS: 20.0000 mg | ORAL_TABLET | Freq: Every day | ORAL | 3 refills | Status: DC

## 2018-02-22 MED ORDER — ROSUVASTATIN CALCIUM 10 MG PO TABS: 5.0000 mg | ORAL_TABLET | Freq: Every day | ORAL | 3 refills | Status: DC
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
## 2018-02-22 NOTE — Assessment & Plan Note (Signed)
With daily itching and scratching Disc moisturizers Avoid hot water  otc antihistamine every day for itch  Triamcinolone cream prn

## 2018-02-22 NOTE — Assessment & Plan Note (Signed)
Discussed how this problem influences overall health and the risks it imposes  Reviewed plan for weight loss with lower calorie diet (via better food choices and also portion control or program like weight watchers) and exercise building up to or more than 30 minutes 5 days per week including some aerobic activity   Down 10 lb from a year ago and A1C is improved

## 2018-02-22 NOTE — Assessment & Plan Note (Signed)
Suspect eczema vs psoriasis -esp on R leg/ R elbow  Triamcinolone Moisturizers  Consider dermatology if worse

## 2018-02-22 NOTE — Assessment & Plan Note (Signed)
Chronic/life long A little worse after lumpectomy and with weather change  Disc trial of flexeril prn instead of tylenol Needs to avoid nsaid due to renal insuff Disc imp of fluid intake

## 2018-02-22 NOTE — Patient Instructions (Addendum)
Use flexeril for your migraines (by itself)- caution of sedation   Don't forget to schedule your eye exam   Keep working on water intake  Avoid ibuprofen and other nsaids   If you are interested in the new shingles vaccine (Shingrix) - call your local pharmacy to check on coverage and availability  If affordable, get on a wait list at your pharmacy to get the vaccine.  Let's plan a flu shot in 1-2 weeks   Keep working on healthy diet and weight loss  Exercise/ stay active as much as you can   Try zyrtec or claritin or allegra - take regularly for itching  Moisturize - and that will also help itching

## 2018-02-22 NOTE — Assessment & Plan Note (Signed)
Disc goals for lipids and reasons to control them Rev last labs with pt Rev low sat fat diet in detail Well controlled with crestor and diet Enc exercise to raise HDL

## 2018-02-22 NOTE — Assessment & Plan Note (Signed)
Stable with GFR in mid 40s Enc water intake-and avoidance of nephro toxic meds

## 2018-02-22 NOTE — Assessment & Plan Note (Signed)
S/p lumpectomy and RT Doing well  Declines aromatase inhibitor given age and risk of side eff Continue oncology and surg visits

## 2018-02-22 NOTE — Assessment & Plan Note (Signed)
Stable with celexa Some stress Got through breast cancer this year

## 2018-02-22 NOTE — Assessment & Plan Note (Signed)
Recent ferritin and iron labs not overly low  Heme/onc is investigating  Has renal insuff - may be from chronic dz

## 2018-02-22 NOTE — Assessment & Plan Note (Signed)
Lab Results  Component Value Date   HGBA1C 5.7 02/17/2018   Out of DM range No medication  Has lost 10 lb this year disc imp of low glycemic diet and wt loss to prevent DM2

## 2018-02-22 NOTE — Assessment & Plan Note (Signed)
bp in fair control at this time  BP Readings from Last 1 Encounters:  02/22/18 (!) 130/56   No changes needed Most recent labs reviewed  Disc lifstyle change with low sodium diet and exercise

## 2018-03-03 ENCOUNTER — Ambulatory Visit (INDEPENDENT_AMBULATORY_CARE_PROVIDER_SITE_OTHER): Payer: Medicare Other

## 2018-03-03 DIAGNOSIS — Z23 Encounter for immunization: Secondary | ICD-10-CM | POA: Diagnosis not present

## 2018-04-01 ENCOUNTER — Other Ambulatory Visit: Payer: Self-pay

## 2018-04-21 ENCOUNTER — Encounter: Payer: Self-pay | Admitting: Radiation Oncology

## 2018-04-21 ENCOUNTER — Ambulatory Visit
Admission: RE | Admit: 2018-04-21 | Discharge: 2018-04-21 | Disposition: A | Discharge status: Home / Self Care | Payer: Medicare Other | Source: Ambulatory Visit | Attending: Radiation Oncology | Admitting: Radiation Oncology

## 2018-04-21 ENCOUNTER — Other Ambulatory Visit: Payer: Self-pay

## 2018-04-21 VITALS — BP 157/69 | HR 62 | Temp 99.1°F | Resp 18 | Wt 206.0 lb

## 2018-04-21 DIAGNOSIS — Z170 Estrogen receptor positive status [ER+]: Principal | ICD-10-CM

## 2018-04-21 DIAGNOSIS — C50412 Malignant neoplasm of upper-outer quadrant of left female breast: Secondary | ICD-10-CM

## 2018-04-21 NOTE — Progress Notes (Signed)
Radiation Oncology Follow up Note  Name: Ann Cummings   Date:   04/21/2018 MRN:  [id number] DOB: 1940/09/26    This 77 y.o. female presents to the clinic today for 6 month follow-up status post accelerated partial breast radiation to her left breast for stage I ER/PR positive invasive mammary carcinoma.  REFERRING PROVIDER: Tower, [name], MD  HPI: patient is a 77 year old female now seen out 6 months having completed accelerated partial breast radiation to her left breast for stage I ER/PR positive invasive mammary carcinoma seen today in routine follow-up she is doing well from a breast standpoint she does have a rash on her sun exposed skin including her upper extremities lower abdomen and legs. This is of unknown etiology and I have requested she sees a dermatologist. She specifically denies breast tenderness cough or bone pain..patient is not on antiestrogen therapy.she has refused aromatase inhibitors.  COMPLICATIONS OF TREATMENT: none  FOLLOW UP COMPLIANCE: keeps appointments   PHYSICAL EXAM:  BP (!) 157/69 (BP Location: Left Arm, Patient Position: Sitting)   Pulse 62   Temp 99.1 F (37.3 C) (Tympanic)   Resp 18   Wt 206 lb 0.3 oz (93.5 kg)   BMI 35.36 kg/m  Lungs are clear to A&P cardiac examination essentially unremarkable with regular rate and rhythm. No dominant mass or nodularity is noted in either breast in 2 positions examined. Incision is well-healed. No axillary or supraclavicular adenopathy is appreciated. Cosmetic result is excellent.patient does have a macular type rash over her sun exposed areas of upper extremities lower extremities and lower abdomen.Well-developed well-nourished patient in NAD. HEENT reveals PERLA, EOMI, discs not visualized.  Oral cavity is clear. No oral mucosal lesions are identified. Neck is clear without evidence of cervical or supraclavicular adenopathy. Lungs are clear to A&P. Cardiac examination is essentially unremarkable with regular rate  and rhythm without murmur rub or thrill. Abdomen is benign with no organomegaly or masses noted. Motor sensory and DTR levels are equal and symmetric in the upper and lower extremities. Cranial nerves II through XII are grossly intact. Proprioception is intact. No peripheral adenopathy or edema is identified. No motor or sensory levels are noted. Crude visual fields are within normal range.  RADIOLOGY RESULTS: she is scheduled for mammogram next month.  PLAN: present time I specifically asked her to see a dermatologist as soon as possible for discussion of this rash. Her breast and point she is doing well. Patient continues to refuse aromatase inhibitor treatment. I've asked to see her back in 1 year for follow-up. Follow-up mammograms next month. Patient knows to call with any concerns.  I would like to take this opportunity to thank you for allowing me to participate in the care of your patient.Noreene Filbert, MD

## 2018-04-22 DIAGNOSIS — D1801 Hemangioma of skin and subcutaneous tissue: Secondary | ICD-10-CM | POA: Diagnosis not present

## 2018-04-22 DIAGNOSIS — Z85828 Personal history of other malignant neoplasm of skin: Secondary | ICD-10-CM | POA: Diagnosis not present

## 2018-04-22 DIAGNOSIS — L400 Psoriasis vulgaris: Secondary | ICD-10-CM | POA: Diagnosis not present

## 2018-04-22 DIAGNOSIS — Z08 Encounter for follow-up examination after completed treatment for malignant neoplasm: Secondary | ICD-10-CM | POA: Diagnosis not present

## 2018-04-22 DIAGNOSIS — D485 Neoplasm of uncertain behavior of skin: Secondary | ICD-10-CM | POA: Diagnosis not present

## 2018-04-22 DIAGNOSIS — L309 Dermatitis, unspecified: Secondary | ICD-10-CM | POA: Diagnosis not present

## 2018-05-09 ENCOUNTER — Other Ambulatory Visit: Payer: Self-pay | Admitting: *Deleted

## 2018-05-09 MED ORDER — DICYCLOMINE HCL 20 MG PO TABS: 20.0000 mg | ORAL_TABLET | Freq: Two times a day (BID) | ORAL | 1 refills | Status: DC

## 2018-05-09 NOTE — Telephone Encounter (Signed)
CPE scheduled for 02/28/19, last filled on 02/08/18 #180 tabs with 0 refill, please advise   [facility]

## 2018-05-23 ENCOUNTER — Other Ambulatory Visit: Payer: Self-pay

## 2018-05-23 DIAGNOSIS — D649 Anemia, unspecified: Secondary | ICD-10-CM

## 2018-05-24 ENCOUNTER — Encounter: Payer: Self-pay | Admitting: Oncology

## 2018-05-24 ENCOUNTER — Inpatient Hospital Stay: Payer: Medicare Other | Attending: Oncology

## 2018-05-24 ENCOUNTER — Other Ambulatory Visit: Payer: Self-pay

## 2018-05-24 ENCOUNTER — Inpatient Hospital Stay (HOSPITAL_BASED_OUTPATIENT_CLINIC_OR_DEPARTMENT_OTHER): Payer: Medicare Other | Admitting: Oncology

## 2018-05-24 VITALS — BP 153/59 | HR 51 | Temp 97.7°F | Resp 18 | Wt 205.7 lb

## 2018-05-24 DIAGNOSIS — Z79899 Other long term (current) drug therapy: Secondary | ICD-10-CM | POA: Insufficient documentation

## 2018-05-24 DIAGNOSIS — D631 Anemia in chronic kidney disease: Secondary | ICD-10-CM | POA: Diagnosis not present

## 2018-05-24 DIAGNOSIS — E538 Deficiency of other specified B group vitamins: Secondary | ICD-10-CM | POA: Diagnosis not present

## 2018-05-24 DIAGNOSIS — I129 Hypertensive chronic kidney disease with stage 1 through stage 4 chronic kidney disease, or unspecified chronic kidney disease: Secondary | ICD-10-CM | POA: Insufficient documentation

## 2018-05-24 DIAGNOSIS — N183 Chronic kidney disease, stage 3 unspecified: Secondary | ICD-10-CM

## 2018-05-24 DIAGNOSIS — Z170 Estrogen receptor positive status [ER+]: Secondary | ICD-10-CM | POA: Diagnosis not present

## 2018-05-24 DIAGNOSIS — D649 Anemia, unspecified: Secondary | ICD-10-CM

## 2018-05-24 DIAGNOSIS — C50412 Malignant neoplasm of upper-outer quadrant of left female breast: Secondary | ICD-10-CM

## 2018-05-24 DIAGNOSIS — D518 Other vitamin B12 deficiency anemias: Secondary | ICD-10-CM

## 2018-05-24 LAB — CBC WITH DIFFERENTIAL/PLATELET
Abs Immature Granulocytes: 0.04 10*3/uL (ref 0.00–0.07)
Basophils Absolute: 0 10*3/uL (ref 0.0–0.1)
Basophils Relative: 0 %
EOS ABS: 0.2 10*3/uL (ref 0.0–0.5)
EOS PCT: 2 %
HCT: 31.4 % — ABNORMAL LOW (ref 36.0–46.0)
Hemoglobin: 9.8 g/dL — ABNORMAL LOW (ref 12.0–15.0)
IMMATURE GRANULOCYTES: 0 %
Lymphocytes Relative: 14 %
Lymphs Abs: 1.4 10*3/uL (ref 0.7–4.0)
MCH: 28.2 pg (ref 26.0–34.0)
MCHC: 31.2 g/dL (ref 30.0–36.0)
MCV: 90.2 fL (ref 80.0–100.0)
Monocytes Absolute: 0.7 10*3/uL (ref 0.1–1.0)
Monocytes Relative: 7 %
NEUTROS PCT: 77 %
Neutro Abs: 7.6 10*3/uL (ref 1.7–7.7)
PLATELETS: 327 10*3/uL (ref 150–400)
RBC: 3.48 MIL/uL — AB (ref 3.87–5.11)
RDW: 13.4 % (ref 11.5–15.5)
WBC: 10 10*3/uL (ref 4.0–10.5)
nRBC: 0 % (ref 0.0–0.2)

## 2018-05-24 LAB — COMPREHENSIVE METABOLIC PANEL
ALT: 12 U/L (ref 0–44)
ANION GAP: 7 (ref 5–15)
AST: 17 U/L (ref 15–41)
Albumin: 3.6 g/dL (ref 3.5–5.0)
Alkaline Phosphatase: 41 U/L (ref 38–126)
BUN: 22 mg/dL (ref 8–23)
CHLORIDE: 104 mmol/L (ref 98–111)
CO2: 28 mmol/L (ref 22–32)
CREATININE: 1.29 mg/dL — AB (ref 0.44–1.00)
Calcium: 9.2 mg/dL (ref 8.9–10.3)
GFR calc non Af Amer: 40 mL/min — ABNORMAL LOW (ref >60)
GFR, EST AFRICAN AMERICAN: 47 mL/min — AB (ref >60)
GLUCOSE: 97 mg/dL (ref 70–99)
POTASSIUM: 4.4 mmol/L (ref 3.5–5.1)
SODIUM: 139 mmol/L (ref 135–145)
TOTAL PROTEIN: 6.4 g/dL — AB (ref 6.5–8.1)
Total Bilirubin: 0.4 mg/dL (ref 0.3–1.2)

## 2018-05-24 LAB — IRON AND TIBC
Iron: 59 ug/dL (ref 28–170)
SATURATION RATIOS: 17 % (ref 10.4–31.8)
TIBC: 346 ug/dL (ref 250–450)
UIBC: 287 ug/dL

## 2018-05-24 LAB — VITAMIN B12: Vitamin B-12: 216 pg/mL (ref 180–914)

## 2018-05-24 LAB — FERRITIN: Ferritin: 25 ng/mL (ref 11–307)
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
## 2018-05-24 NOTE — Addendum Note (Signed)
Addended by: Earlie Server on: 05/24/2018 09:52 PM   Modules accepted: Orders

## 2018-05-25 LAB — KAPPA/LAMBDA LIGHT CHAINS
KAPPA FREE LGHT CHN: 22.3 mg/L — AB (ref 3.3–19.4)
KAPPA, LAMDA LIGHT CHAIN RATIO: 1.52 (ref 0.26–1.65)
Lambda free light chains: 14.7 mg/L (ref 5.7–26.3)

## 2018-05-26 LAB — MULTIPLE MYELOMA PANEL, SERUM
ALBUMIN/GLOB SERPL: 1.3 (ref 0.7–1.7)
ALPHA 1: 0.2 g/dL (ref 0.0–0.4)
ALPHA2 GLOB SERPL ELPH-MCNC: 1.1 g/dL — AB (ref 0.4–1.0)
Albumin SerPl Elph-Mcnc: 3.2 g/dL (ref 2.9–4.4)
B-Globulin SerPl Elph-Mcnc: 0.9 g/dL (ref 0.7–1.3)
Gamma Glob SerPl Elph-Mcnc: 0.3 g/dL — ABNORMAL LOW (ref 0.4–1.8)
Globulin, Total: 2.6 g/dL (ref 2.2–3.9)
IGG (IMMUNOGLOBIN G), SERUM: 480 mg/dL — AB (ref 700–1600)
IGM (IMMUNOGLOBULIN M), SRM: 27 mg/dL (ref 26–217)
IgA: 137 mg/dL (ref 64–422)
TOTAL PROTEIN ELP: 5.8 g/dL — AB (ref 6.0–8.5)

## 2018-06-02 ENCOUNTER — Inpatient Hospital Stay: Payer: Medicare Other

## 2018-06-02 VITALS — BP 129/72 | HR 58 | Resp 20

## 2018-06-02 DIAGNOSIS — D508 Other iron deficiency anemias: Secondary | ICD-10-CM

## 2018-06-02 DIAGNOSIS — N183 Chronic kidney disease, stage 3 (moderate): Secondary | ICD-10-CM | POA: Diagnosis not present

## 2018-06-02 DIAGNOSIS — D631 Anemia in chronic kidney disease: Secondary | ICD-10-CM | POA: Diagnosis not present

## 2018-06-02 DIAGNOSIS — E538 Deficiency of other specified B group vitamins: Secondary | ICD-10-CM | POA: Diagnosis not present

## 2018-06-02 DIAGNOSIS — C50412 Malignant neoplasm of upper-outer quadrant of left female breast: Secondary | ICD-10-CM | POA: Diagnosis not present

## 2018-06-02 DIAGNOSIS — Z170 Estrogen receptor positive status [ER+]: Secondary | ICD-10-CM | POA: Diagnosis not present

## 2018-06-02 DIAGNOSIS — I129 Hypertensive chronic kidney disease with stage 1 through stage 4 chronic kidney disease, or unspecified chronic kidney disease: Secondary | ICD-10-CM | POA: Diagnosis not present

## 2018-06-02 MED ORDER — SODIUM CHLORIDE 0.9 % IV SOLN
Freq: Once | INTRAVENOUS | Status: AC
  Administered 2018-06-02: 14:00:00 via INTRAVENOUS
  Filled 2018-06-02: qty 250

## 2018-06-02 MED ORDER — IRON SUCROSE 20 MG/ML IV SOLN
200.0000 mg | Freq: Once | INTRAVENOUS | Status: AC
  Administered 2018-06-02: 200 mg via INTRAVENOUS
  Filled 2018-06-02: qty 10

## 2018-06-02 MED ORDER — SODIUM CHLORIDE 0.9 % IV SOLN: 200.0000 mg | Freq: Once | INTRAVENOUS | Status: DC

## 2018-06-02 MED ORDER — CYANOCOBALAMIN 1000 MCG/ML IJ SOLN
1000.0000 ug | Freq: Once | INTRAMUSCULAR | Status: AC
  Administered 2018-06-02: 1000 ug via INTRAMUSCULAR
  Filled 2018-06-02: qty 1

## 2018-06-09 ENCOUNTER — Inpatient Hospital Stay: Payer: Medicare Other

## 2018-06-09 VITALS — BP 124/73 | HR 59 | Temp 98.3°F | Resp 18

## 2018-06-09 DIAGNOSIS — D508 Other iron deficiency anemias: Secondary | ICD-10-CM

## 2018-06-09 DIAGNOSIS — D631 Anemia in chronic kidney disease: Secondary | ICD-10-CM | POA: Diagnosis not present

## 2018-06-09 DIAGNOSIS — Z170 Estrogen receptor positive status [ER+]: Secondary | ICD-10-CM | POA: Diagnosis not present

## 2018-06-09 DIAGNOSIS — I129 Hypertensive chronic kidney disease with stage 1 through stage 4 chronic kidney disease, or unspecified chronic kidney disease: Secondary | ICD-10-CM | POA: Diagnosis not present

## 2018-06-09 DIAGNOSIS — E538 Deficiency of other specified B group vitamins: Secondary | ICD-10-CM | POA: Diagnosis not present

## 2018-06-09 DIAGNOSIS — C50412 Malignant neoplasm of upper-outer quadrant of left female breast: Secondary | ICD-10-CM | POA: Diagnosis not present

## 2018-06-09 DIAGNOSIS — N183 Chronic kidney disease, stage 3 (moderate): Secondary | ICD-10-CM | POA: Diagnosis not present

## 2018-06-09 MED ORDER — CYANOCOBALAMIN 1000 MCG/ML IJ SOLN
1000.0000 ug | Freq: Once | INTRAMUSCULAR | Status: AC
  Administered 2018-06-09: 1000 ug via INTRAMUSCULAR
  Filled 2018-06-09: qty 1

## 2018-06-09 MED ORDER — IRON SUCROSE 20 MG/ML IV SOLN
200.0000 mg | Freq: Once | INTRAVENOUS | Status: AC
  Administered 2018-06-09: 200 mg via INTRAVENOUS
  Filled 2018-06-09: qty 10

## 2018-06-09 MED ORDER — SODIUM CHLORIDE 0.9 % IV SOLN
Freq: Once | INTRAVENOUS | Status: AC
  Administered 2018-06-09: 14:00:00 via INTRAVENOUS
  Filled 2018-06-09: qty 250

## 2018-06-16 ENCOUNTER — Inpatient Hospital Stay: Payer: Medicare Other

## 2018-06-16 VITALS — BP 166/68 | HR 60 | Temp 96.6°F | Resp 20

## 2018-06-16 DIAGNOSIS — E538 Deficiency of other specified B group vitamins: Secondary | ICD-10-CM | POA: Diagnosis not present

## 2018-06-16 DIAGNOSIS — D631 Anemia in chronic kidney disease: Secondary | ICD-10-CM | POA: Diagnosis not present

## 2018-06-16 DIAGNOSIS — Z170 Estrogen receptor positive status [ER+]: Secondary | ICD-10-CM | POA: Diagnosis not present

## 2018-06-16 DIAGNOSIS — N183 Chronic kidney disease, stage 3 (moderate): Secondary | ICD-10-CM | POA: Diagnosis not present

## 2018-06-16 DIAGNOSIS — C50412 Malignant neoplasm of upper-outer quadrant of left female breast: Secondary | ICD-10-CM | POA: Diagnosis not present

## 2018-06-16 DIAGNOSIS — D508 Other iron deficiency anemias: Secondary | ICD-10-CM

## 2018-06-16 DIAGNOSIS — I129 Hypertensive chronic kidney disease with stage 1 through stage 4 chronic kidney disease, or unspecified chronic kidney disease: Secondary | ICD-10-CM | POA: Diagnosis not present

## 2018-06-16 MED ORDER — SODIUM CHLORIDE 0.9 % IV SOLN
Freq: Once | INTRAVENOUS | Status: AC
  Administered 2018-06-16: 14:00:00 via INTRAVENOUS
  Filled 2018-06-16: qty 250

## 2018-06-16 MED ORDER — CYANOCOBALAMIN 1000 MCG/ML IJ SOLN
1000.0000 ug | Freq: Once | INTRAMUSCULAR | Status: AC
  Administered 2018-06-16: 1000 ug via INTRAMUSCULAR
  Filled 2018-06-16: qty 1

## 2018-06-16 MED ORDER — IRON SUCROSE 20 MG/ML IV SOLN
200.0000 mg | Freq: Once | INTRAVENOUS | Status: AC
  Administered 2018-06-16: 200 mg via INTRAVENOUS
  Filled 2018-06-16: qty 10

## 2018-06-23 ENCOUNTER — Inpatient Hospital Stay: Payer: Medicare Other | Attending: Oncology

## 2018-07-04 ENCOUNTER — Inpatient Hospital Stay: Payer: Medicare Other | Attending: Oncology

## 2018-07-04 DIAGNOSIS — I129 Hypertensive chronic kidney disease with stage 1 through stage 4 chronic kidney disease, or unspecified chronic kidney disease: Secondary | ICD-10-CM | POA: Insufficient documentation

## 2018-07-04 DIAGNOSIS — D631 Anemia in chronic kidney disease: Secondary | ICD-10-CM | POA: Diagnosis not present

## 2018-07-04 DIAGNOSIS — N183 Chronic kidney disease, stage 3 unspecified: Secondary | ICD-10-CM

## 2018-07-04 DIAGNOSIS — Z170 Estrogen receptor positive status [ER+]: Secondary | ICD-10-CM | POA: Diagnosis not present

## 2018-07-04 DIAGNOSIS — D518 Other vitamin B12 deficiency anemias: Secondary | ICD-10-CM

## 2018-07-04 DIAGNOSIS — C50412 Malignant neoplasm of upper-outer quadrant of left female breast: Secondary | ICD-10-CM | POA: Diagnosis not present

## 2018-07-04 DIAGNOSIS — Z79899 Other long term (current) drug therapy: Secondary | ICD-10-CM | POA: Insufficient documentation

## 2018-07-04 LAB — COMPREHENSIVE METABOLIC PANEL
ALT: 13 U/L (ref 0–44)
AST: 16 U/L (ref 15–41)
Albumin: 3.8 g/dL (ref 3.5–5.0)
Alkaline Phosphatase: 41 U/L (ref 38–126)
Anion gap: 8 (ref 5–15)
BUN: 24 mg/dL — AB (ref 8–23)
CO2: 25 mmol/L (ref 22–32)
Calcium: 9 mg/dL (ref 8.9–10.3)
Chloride: 105 mmol/L (ref 98–111)
Creatinine, Ser: 1.11 mg/dL — ABNORMAL HIGH (ref 0.44–1.00)
GFR calc Af Amer: 56 mL/min — ABNORMAL LOW (ref >60)
GFR calc non Af Amer: 48 mL/min — ABNORMAL LOW (ref >60)
Glucose, Bld: 101 mg/dL — ABNORMAL HIGH (ref 70–99)
POTASSIUM: 4.4 mmol/L (ref 3.5–5.1)
Sodium: 138 mmol/L (ref 135–145)
Total Bilirubin: 0.4 mg/dL (ref 0.3–1.2)
Total Protein: 6.7 g/dL (ref 6.5–8.1)

## 2018-07-04 LAB — CBC WITH DIFFERENTIAL/PLATELET
Abs Immature Granulocytes: 0.03 10*3/uL (ref 0.00–0.07)
Basophils Absolute: 0.1 10*3/uL (ref 0.0–0.1)
Basophils Relative: 1 %
Eosinophils Absolute: 0.3 10*3/uL (ref 0.0–0.5)
Eosinophils Relative: 3 %
HCT: 33.2 % — ABNORMAL LOW (ref 36.0–46.0)
Hemoglobin: 10.9 g/dL — ABNORMAL LOW (ref 12.0–15.0)
IMMATURE GRANULOCYTES: 0 %
LYMPHS ABS: 1.5 10*3/uL (ref 0.7–4.0)
LYMPHS PCT: 16 %
MCH: 30.1 pg (ref 26.0–34.0)
MCHC: 32.8 g/dL (ref 30.0–36.0)
MCV: 91.7 fL (ref 80.0–100.0)
Monocytes Absolute: 0.7 10*3/uL (ref 0.1–1.0)
Monocytes Relative: 7 %
Neutro Abs: 6.8 10*3/uL (ref 1.7–7.7)
Neutrophils Relative %: 73 %
Platelets: 315 10*3/uL (ref 150–400)
RBC: 3.62 MIL/uL — ABNORMAL LOW (ref 3.87–5.11)
RDW: 14.1 % (ref 11.5–15.5)
WBC: 9.2 10*3/uL (ref 4.0–10.5)
nRBC: 0 % (ref 0.0–0.2)

## 2018-07-04 LAB — IRON AND TIBC
Iron: 55 ug/dL (ref 28–170)
Saturation Ratios: 18 % (ref 10.4–31.8)
TIBC: 303 ug/dL (ref 250–450)
UIBC: 248 ug/dL

## 2018-07-04 LAB — FERRITIN: Ferritin: 158 ng/mL (ref 11–307)

## 2018-07-06 ENCOUNTER — Inpatient Hospital Stay (HOSPITAL_BASED_OUTPATIENT_CLINIC_OR_DEPARTMENT_OTHER): Payer: Medicare Other | Admitting: Oncology

## 2018-07-06 ENCOUNTER — Encounter: Payer: Self-pay | Admitting: Oncology

## 2018-07-06 ENCOUNTER — Inpatient Hospital Stay: Payer: Medicare Other

## 2018-07-06 ENCOUNTER — Other Ambulatory Visit: Payer: Self-pay

## 2018-07-06 VITALS — BP 153/55 | HR 55 | Temp 97.0°F | Wt 204.3 lb

## 2018-07-06 DIAGNOSIS — Z170 Estrogen receptor positive status [ER+]: Secondary | ICD-10-CM

## 2018-07-06 DIAGNOSIS — C50412 Malignant neoplasm of upper-outer quadrant of left female breast: Secondary | ICD-10-CM | POA: Diagnosis not present

## 2018-07-06 DIAGNOSIS — N183 Chronic kidney disease, stage 3 unspecified: Secondary | ICD-10-CM

## 2018-07-06 DIAGNOSIS — D631 Anemia in chronic kidney disease: Secondary | ICD-10-CM

## 2018-07-06 DIAGNOSIS — I129 Hypertensive chronic kidney disease with stage 1 through stage 4 chronic kidney disease, or unspecified chronic kidney disease: Secondary | ICD-10-CM | POA: Diagnosis not present

## 2018-07-06 DIAGNOSIS — D508 Other iron deficiency anemias: Secondary | ICD-10-CM

## 2018-07-06 DIAGNOSIS — Z79899 Other long term (current) drug therapy: Secondary | ICD-10-CM | POA: Diagnosis not present

## 2018-07-06 DIAGNOSIS — D518 Other vitamin B12 deficiency anemias: Secondary | ICD-10-CM

## 2018-07-06 MED ORDER — SODIUM CHLORIDE 0.9 % IV SOLN
Freq: Once | INTRAVENOUS | Status: AC
  Administered 2018-07-06: 14:00:00 via INTRAVENOUS
  Filled 2018-07-06: qty 250

## 2018-07-06 MED ORDER — CYANOCOBALAMIN 1000 MCG/ML IJ SOLN
1000.0000 ug | Freq: Once | INTRAMUSCULAR | Status: AC
  Administered 2018-07-06: 1000 ug via INTRAMUSCULAR
  Filled 2018-07-06: qty 1

## 2018-07-06 MED ORDER — IRON SUCROSE 20 MG/ML IV SOLN
200.0000 mg | Freq: Once | INTRAVENOUS | Status: AC
  Administered 2018-07-06: 200 mg via INTRAVENOUS
  Filled 2018-07-06: qty 10

## 2018-07-06 NOTE — Progress Notes (Signed)
Patient here today for follow up.  Patient c/o fatigue 
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
## 2018-07-11 ENCOUNTER — Ambulatory Visit
Admission: RE | Admit: 2018-07-11 | Discharge: 2018-07-11 | Disposition: A | Discharge status: Home / Self Care | Payer: Medicare Other | Source: Ambulatory Visit | Attending: Oncology | Admitting: Oncology

## 2018-07-11 DIAGNOSIS — Z170 Estrogen receptor positive status [ER+]: Principal | ICD-10-CM

## 2018-07-11 DIAGNOSIS — Z853 Personal history of malignant neoplasm of breast: Secondary | ICD-10-CM | POA: Diagnosis not present

## 2018-07-11 DIAGNOSIS — R922 Inconclusive mammogram: Secondary | ICD-10-CM | POA: Diagnosis not present

## 2018-07-11 DIAGNOSIS — C50412 Malignant neoplasm of upper-outer quadrant of left female breast: Secondary | ICD-10-CM | POA: Diagnosis not present

## 2018-07-11 HISTORY — DX: Personal history of irradiation: Z92.3

## 2018-07-19 ENCOUNTER — Encounter: Payer: Self-pay | Admitting: General Surgery

## 2018-07-19 ENCOUNTER — Other Ambulatory Visit: Payer: Self-pay

## 2018-07-19 ENCOUNTER — Ambulatory Visit (INDEPENDENT_AMBULATORY_CARE_PROVIDER_SITE_OTHER): Payer: Medicare Other | Admitting: General Surgery

## 2018-07-19 VITALS — BP 140/72 | HR 74 | Temp 97.7°F | Resp 16 | Ht 64.0 in | Wt 204.0 lb

## 2018-07-19 DIAGNOSIS — Z170 Estrogen receptor positive status [ER+]: Secondary | ICD-10-CM

## 2018-07-19 DIAGNOSIS — C50412 Malignant neoplasm of upper-outer quadrant of left female breast: Secondary | ICD-10-CM | POA: Diagnosis not present
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
## 2018-07-19 NOTE — Patient Instructions (Signed)
The patient has been asked to return to the office in one year with a bilateral diagnostic mammogram.The patient is aware to call back for any questions or concerns. 

## 2018-07-21 ENCOUNTER — Inpatient Hospital Stay: Payer: Medicare Other | Attending: Oncology

## 2018-07-21 DIAGNOSIS — D631 Anemia in chronic kidney disease: Secondary | ICD-10-CM | POA: Diagnosis not present

## 2018-07-21 DIAGNOSIS — I129 Hypertensive chronic kidney disease with stage 1 through stage 4 chronic kidney disease, or unspecified chronic kidney disease: Secondary | ICD-10-CM | POA: Diagnosis not present

## 2018-07-21 DIAGNOSIS — N183 Chronic kidney disease, stage 3 (moderate): Secondary | ICD-10-CM | POA: Diagnosis not present

## 2018-07-21 DIAGNOSIS — Z170 Estrogen receptor positive status [ER+]: Secondary | ICD-10-CM | POA: Insufficient documentation

## 2018-07-21 DIAGNOSIS — Z79899 Other long term (current) drug therapy: Secondary | ICD-10-CM | POA: Insufficient documentation

## 2018-07-21 DIAGNOSIS — C50412 Malignant neoplasm of upper-outer quadrant of left female breast: Secondary | ICD-10-CM | POA: Insufficient documentation

## 2018-07-21 DIAGNOSIS — D508 Other iron deficiency anemias: Secondary | ICD-10-CM

## 2018-07-21 MED ORDER — CYANOCOBALAMIN 1000 MCG/ML IJ SOLN
1000.0000 ug | Freq: Once | INTRAMUSCULAR | Status: AC
  Administered 2018-07-21: 1000 ug via INTRAMUSCULAR

## 2018-08-09 DIAGNOSIS — L570 Actinic keratosis: Secondary | ICD-10-CM | POA: Diagnosis not present

## 2018-08-09 DIAGNOSIS — L400 Psoriasis vulgaris: Secondary | ICD-10-CM | POA: Diagnosis not present

## 2018-08-09 DIAGNOSIS — L853 Xerosis cutis: Secondary | ICD-10-CM | POA: Diagnosis not present

## 2018-08-14 ENCOUNTER — Other Ambulatory Visit: Payer: Self-pay | Admitting: Family Medicine

## 2018-08-25 ENCOUNTER — Inpatient Hospital Stay: Payer: Medicare Other

## 2018-09-01 ENCOUNTER — Telehealth: Payer: Self-pay | Admitting: *Deleted

## 2018-09-01 NOTE — Telephone Encounter (Signed)
PA done at [email or web address] for pt's dicyclomine, I will await a response

## 2018-09-02 NOTE — Telephone Encounter (Signed)
Left VM letting pt's pharmacy know PA was approved

## 2018-09-02 NOTE — Telephone Encounter (Signed)
Ann Cummings with [employer] left v/m approving PA for Dicyclomine from 09/01/18 - 09/01/19. If needs copy of PA approval call [id number] option 5.

## 2018-10-02 ENCOUNTER — Other Ambulatory Visit: Payer: Self-pay

## 2018-10-03 ENCOUNTER — Other Ambulatory Visit: Payer: Self-pay

## 2018-10-03 ENCOUNTER — Inpatient Hospital Stay: Payer: Medicare Other | Attending: Oncology

## 2018-10-03 DIAGNOSIS — Z85828 Personal history of other malignant neoplasm of skin: Secondary | ICD-10-CM | POA: Insufficient documentation

## 2018-10-03 DIAGNOSIS — Z8249 Family history of ischemic heart disease and other diseases of the circulatory system: Secondary | ICD-10-CM | POA: Insufficient documentation

## 2018-10-03 DIAGNOSIS — Z885 Allergy status to narcotic agent status: Secondary | ICD-10-CM | POA: Diagnosis not present

## 2018-10-03 DIAGNOSIS — Z800 Family history of malignant neoplasm of digestive organs: Secondary | ICD-10-CM | POA: Insufficient documentation

## 2018-10-03 DIAGNOSIS — Z8059 Family history of malignant neoplasm of other urinary tract organ: Secondary | ICD-10-CM | POA: Insufficient documentation

## 2018-10-03 DIAGNOSIS — E669 Obesity, unspecified: Secondary | ICD-10-CM | POA: Insufficient documentation

## 2018-10-03 DIAGNOSIS — Z833 Family history of diabetes mellitus: Secondary | ICD-10-CM | POA: Insufficient documentation

## 2018-10-03 DIAGNOSIS — F329 Major depressive disorder, single episode, unspecified: Secondary | ICD-10-CM | POA: Insufficient documentation

## 2018-10-03 DIAGNOSIS — G4730 Sleep apnea, unspecified: Secondary | ICD-10-CM | POA: Diagnosis not present

## 2018-10-03 DIAGNOSIS — J45909 Unspecified asthma, uncomplicated: Secondary | ICD-10-CM | POA: Diagnosis not present

## 2018-10-03 DIAGNOSIS — Z888 Allergy status to other drugs, medicaments and biological substances status: Secondary | ICD-10-CM | POA: Diagnosis not present

## 2018-10-03 DIAGNOSIS — Z170 Estrogen receptor positive status [ER+]: Secondary | ICD-10-CM | POA: Insufficient documentation

## 2018-10-03 DIAGNOSIS — F419 Anxiety disorder, unspecified: Secondary | ICD-10-CM | POA: Diagnosis not present

## 2018-10-03 DIAGNOSIS — Z79899 Other long term (current) drug therapy: Secondary | ICD-10-CM | POA: Diagnosis not present

## 2018-10-03 DIAGNOSIS — D518 Other vitamin B12 deficiency anemias: Secondary | ICD-10-CM

## 2018-10-03 DIAGNOSIS — D508 Other iron deficiency anemias: Secondary | ICD-10-CM | POA: Insufficient documentation

## 2018-10-03 DIAGNOSIS — Z9049 Acquired absence of other specified parts of digestive tract: Secondary | ICD-10-CM | POA: Diagnosis not present

## 2018-10-03 DIAGNOSIS — C50412 Malignant neoplasm of upper-outer quadrant of left female breast: Secondary | ICD-10-CM | POA: Insufficient documentation

## 2018-10-03 DIAGNOSIS — K219 Gastro-esophageal reflux disease without esophagitis: Secondary | ICD-10-CM | POA: Insufficient documentation

## 2018-10-03 DIAGNOSIS — E785 Hyperlipidemia, unspecified: Secondary | ICD-10-CM | POA: Diagnosis not present

## 2018-10-03 DIAGNOSIS — D631 Anemia in chronic kidney disease: Secondary | ICD-10-CM | POA: Diagnosis not present

## 2018-10-03 DIAGNOSIS — E1122 Type 2 diabetes mellitus with diabetic chronic kidney disease: Secondary | ICD-10-CM | POA: Diagnosis not present

## 2018-10-03 DIAGNOSIS — Z881 Allergy status to other antibiotic agents status: Secondary | ICD-10-CM | POA: Insufficient documentation

## 2018-10-03 DIAGNOSIS — N183 Chronic kidney disease, stage 3 (moderate): Secondary | ICD-10-CM | POA: Diagnosis not present

## 2018-10-03 DIAGNOSIS — I130 Hypertensive heart and chronic kidney disease with heart failure and stage 1 through stage 4 chronic kidney disease, or unspecified chronic kidney disease: Secondary | ICD-10-CM | POA: Insufficient documentation

## 2018-10-03 LAB — CBC WITH DIFFERENTIAL/PLATELET
Abs Immature Granulocytes: 0.06 10*3/uL (ref 0.00–0.07)
Basophils Absolute: 0 10*3/uL (ref 0.0–0.1)
Basophils Relative: 0 %
Eosinophils Absolute: 0.3 10*3/uL (ref 0.0–0.5)
Eosinophils Relative: 3 %
HCT: 32.1 % — ABNORMAL LOW (ref 36.0–46.0)
Hemoglobin: 10.7 g/dL — ABNORMAL LOW (ref 12.0–15.0)
Immature Granulocytes: 1 %
Lymphocytes Relative: 16 %
Lymphs Abs: 1.7 10*3/uL (ref 0.7–4.0)
MCH: 31 pg (ref 26.0–34.0)
MCHC: 33.3 g/dL (ref 30.0–36.0)
MCV: 93 fL (ref 80.0–100.0)
Monocytes Absolute: 0.8 10*3/uL (ref 0.1–1.0)
Monocytes Relative: 7 %
Neutro Abs: 7.9 10*3/uL — ABNORMAL HIGH (ref 1.7–7.7)
Neutrophils Relative %: 73 %
Platelets: 290 10*3/uL (ref 150–400)
RBC: 3.45 MIL/uL — ABNORMAL LOW (ref 3.87–5.11)
RDW: 12.5 % (ref 11.5–15.5)
WBC: 10.8 10*3/uL — ABNORMAL HIGH (ref 4.0–10.5)
nRBC: 0 % (ref 0.0–0.2)

## 2018-10-03 LAB — IRON AND TIBC
Iron: 45 ug/dL (ref 28–170)
Saturation Ratios: 15 % (ref 10.4–31.8)
TIBC: 294 ug/dL (ref 250–450)
UIBC: 249 ug/dL

## 2018-10-03 LAB — VITAMIN B12: Vitamin B-12: 625 pg/mL (ref 180–914)

## 2018-10-03 LAB — FERRITIN: Ferritin: 169 ng/mL (ref 11–307)

## 2018-10-04 ENCOUNTER — Inpatient Hospital Stay (HOSPITAL_BASED_OUTPATIENT_CLINIC_OR_DEPARTMENT_OTHER): Payer: Medicare Other | Admitting: Oncology

## 2018-10-04 ENCOUNTER — Inpatient Hospital Stay: Payer: Medicare Other

## 2018-10-04 ENCOUNTER — Other Ambulatory Visit: Payer: Self-pay

## 2018-10-04 ENCOUNTER — Encounter: Payer: Self-pay | Admitting: Oncology

## 2018-10-04 ENCOUNTER — Inpatient Hospital Stay: Payer: Medicare Other | Admitting: Oncology

## 2018-10-04 DIAGNOSIS — Z170 Estrogen receptor positive status [ER+]: Secondary | ICD-10-CM

## 2018-10-04 DIAGNOSIS — N183 Chronic kidney disease, stage 3 unspecified: Secondary | ICD-10-CM

## 2018-10-04 DIAGNOSIS — D508 Other iron deficiency anemias: Secondary | ICD-10-CM | POA: Diagnosis not present

## 2018-10-04 DIAGNOSIS — D518 Other vitamin B12 deficiency anemias: Secondary | ICD-10-CM

## 2018-10-04 DIAGNOSIS — D631 Anemia in chronic kidney disease: Secondary | ICD-10-CM

## 2018-10-04 DIAGNOSIS — C50412 Malignant neoplasm of upper-outer quadrant of left female breast: Secondary | ICD-10-CM | POA: Diagnosis not present

## 2018-10-04 NOTE — Progress Notes (Signed)
Called patient for Telehealth visit via FaceTime. Patient states that she has some occasional pain at the surgical site due to calcium deposits that were seen on an xray.
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
## 2018-10-31 ENCOUNTER — Other Ambulatory Visit: Payer: Self-pay | Admitting: Family Medicine

## 2018-11-25 DIAGNOSIS — L400 Psoriasis vulgaris: Secondary | ICD-10-CM | POA: Diagnosis not present

## 2018-11-25 DIAGNOSIS — L570 Actinic keratosis: Secondary | ICD-10-CM | POA: Diagnosis not present

## 2018-12-15 ENCOUNTER — Encounter: Payer: Self-pay | Admitting: General Surgery

## 2018-12-29 ENCOUNTER — Encounter: Payer: Self-pay | Admitting: Oncology

## 2018-12-30 ENCOUNTER — Other Ambulatory Visit: Payer: Self-pay

## 2019-01-02 ENCOUNTER — Other Ambulatory Visit: Payer: Self-pay

## 2019-01-02 ENCOUNTER — Inpatient Hospital Stay: Payer: Medicare Other | Attending: Oncology

## 2019-01-02 DIAGNOSIS — I129 Hypertensive chronic kidney disease with stage 1 through stage 4 chronic kidney disease, or unspecified chronic kidney disease: Secondary | ICD-10-CM | POA: Diagnosis not present

## 2019-01-02 DIAGNOSIS — Z170 Estrogen receptor positive status [ER+]: Secondary | ICD-10-CM | POA: Insufficient documentation

## 2019-01-02 DIAGNOSIS — D631 Anemia in chronic kidney disease: Secondary | ICD-10-CM

## 2019-01-02 DIAGNOSIS — D508 Other iron deficiency anemias: Secondary | ICD-10-CM

## 2019-01-02 DIAGNOSIS — C50412 Malignant neoplasm of upper-outer quadrant of left female breast: Secondary | ICD-10-CM | POA: Insufficient documentation

## 2019-01-02 DIAGNOSIS — D518 Other vitamin B12 deficiency anemias: Secondary | ICD-10-CM

## 2019-01-02 DIAGNOSIS — N183 Chronic kidney disease, stage 3 unspecified: Secondary | ICD-10-CM

## 2019-01-02 DIAGNOSIS — Z79899 Other long term (current) drug therapy: Secondary | ICD-10-CM | POA: Insufficient documentation

## 2019-01-02 DIAGNOSIS — E538 Deficiency of other specified B group vitamins: Secondary | ICD-10-CM | POA: Insufficient documentation

## 2019-01-02 LAB — FERRITIN: Ferritin: 130 ng/mL (ref 11–307)

## 2019-01-02 LAB — COMPREHENSIVE METABOLIC PANEL
ALT: 10 U/L (ref 0–44)
AST: 14 U/L — ABNORMAL LOW (ref 15–41)
Albumin: 3.6 g/dL (ref 3.5–5.0)
Alkaline Phosphatase: 43 U/L (ref 38–126)
Anion gap: 8 (ref 5–15)
BUN: 22 mg/dL (ref 8–23)
CO2: 27 mmol/L (ref 22–32)
Calcium: 8.9 mg/dL (ref 8.9–10.3)
Chloride: 104 mmol/L (ref 98–111)
Creatinine, Ser: 1.24 mg/dL — ABNORMAL HIGH (ref 0.44–1.00)
GFR calc Af Amer: 49 mL/min — ABNORMAL LOW (ref >60)
GFR calc non Af Amer: 42 mL/min — ABNORMAL LOW (ref >60)
Glucose, Bld: 82 mg/dL (ref 70–99)
Potassium: 4.4 mmol/L (ref 3.5–5.1)
Sodium: 139 mmol/L (ref 135–145)
Total Bilirubin: 0.4 mg/dL (ref 0.3–1.2)
Total Protein: 6.5 g/dL (ref 6.5–8.1)

## 2019-01-02 LAB — VITAMIN B12: Vitamin B-12: 564 pg/mL (ref 180–914)

## 2019-01-02 LAB — CBC WITH DIFFERENTIAL/PLATELET
Abs Immature Granulocytes: 0.03 10*3/uL (ref 0.00–0.07)
Basophils Absolute: 0.1 10*3/uL (ref 0.0–0.1)
Basophils Relative: 1 %
Eosinophils Absolute: 0.4 10*3/uL (ref 0.0–0.5)
Eosinophils Relative: 4 %
HCT: 31.7 % — ABNORMAL LOW (ref 36.0–46.0)
Hemoglobin: 10.6 g/dL — ABNORMAL LOW (ref 12.0–15.0)
Immature Granulocytes: 0 %
Lymphocytes Relative: 16 %
Lymphs Abs: 1.4 10*3/uL (ref 0.7–4.0)
MCH: 30.5 pg (ref 26.0–34.0)
MCHC: 33.4 g/dL (ref 30.0–36.0)
MCV: 91.4 fL (ref 80.0–100.0)
Monocytes Absolute: 0.6 10*3/uL (ref 0.1–1.0)
Monocytes Relative: 6 %
Neutro Abs: 6.7 10*3/uL (ref 1.7–7.7)
Neutrophils Relative %: 73 %
Platelets: 288 10*3/uL (ref 150–400)
RBC: 3.47 MIL/uL — ABNORMAL LOW (ref 3.87–5.11)
RDW: 12.7 % (ref 11.5–15.5)
WBC: 9.2 10*3/uL (ref 4.0–10.5)
nRBC: 0 % (ref 0.0–0.2)

## 2019-01-02 LAB — IRON AND TIBC
Iron: 58 ug/dL (ref 28–170)
Saturation Ratios: 20 % (ref 10.4–31.8)
TIBC: 285 ug/dL (ref 250–450)
UIBC: 227 ug/dL

## 2019-01-03 ENCOUNTER — Encounter: Payer: Self-pay | Admitting: Oncology

## 2019-01-03 ENCOUNTER — Other Ambulatory Visit: Payer: Self-pay

## 2019-01-03 ENCOUNTER — Inpatient Hospital Stay (HOSPITAL_BASED_OUTPATIENT_CLINIC_OR_DEPARTMENT_OTHER): Payer: Medicare Other | Admitting: Oncology

## 2019-01-03 ENCOUNTER — Inpatient Hospital Stay: Payer: Medicare Other

## 2019-01-03 VITALS — BP 171/72 | HR 56 | Temp 99.1°F | Wt 205.6 lb

## 2019-01-03 DIAGNOSIS — C50412 Malignant neoplasm of upper-outer quadrant of left female breast: Secondary | ICD-10-CM

## 2019-01-03 DIAGNOSIS — D518 Other vitamin B12 deficiency anemias: Secondary | ICD-10-CM

## 2019-01-03 DIAGNOSIS — N183 Chronic kidney disease, stage 3 (moderate): Secondary | ICD-10-CM | POA: Diagnosis not present

## 2019-01-03 DIAGNOSIS — I129 Hypertensive chronic kidney disease with stage 1 through stage 4 chronic kidney disease, or unspecified chronic kidney disease: Secondary | ICD-10-CM | POA: Diagnosis not present

## 2019-01-03 DIAGNOSIS — Z170 Estrogen receptor positive status [ER+]: Secondary | ICD-10-CM | POA: Diagnosis not present

## 2019-01-03 DIAGNOSIS — D508 Other iron deficiency anemias: Secondary | ICD-10-CM | POA: Diagnosis not present

## 2019-01-03 DIAGNOSIS — D631 Anemia in chronic kidney disease: Secondary | ICD-10-CM | POA: Diagnosis not present

## 2019-01-03 DIAGNOSIS — E538 Deficiency of other specified B group vitamins: Secondary | ICD-10-CM | POA: Diagnosis not present

## 2019-01-03 LAB — INTRINSIC FACTOR ANTIBODIES: Intrinsic Factor: 0.9 AU/mL (ref 0.0–1.1)
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
## 2019-01-03 NOTE — Progress Notes (Signed)
Pt has no complaints today.  

## 2019-01-05 LAB — ANTI-PARIETAL ANTIBODY: Parietal Cell Antibody-IgG: 57.2 Units — ABNORMAL HIGH (ref 0.0–20.0)

## 2019-01-11 ENCOUNTER — Ambulatory Visit (INDEPENDENT_AMBULATORY_CARE_PROVIDER_SITE_OTHER): Payer: Medicare Other | Admitting: Internal Medicine

## 2019-01-11 ENCOUNTER — Other Ambulatory Visit: Payer: Medicare Other

## 2019-01-11 ENCOUNTER — Encounter: Payer: Self-pay | Admitting: Internal Medicine

## 2019-01-11 VITALS — BP 142/60 | HR 60 | Temp 98.0°F | Ht 64.0 in | Wt 204.0 lb

## 2019-01-11 DIAGNOSIS — K580 Irritable bowel syndrome with diarrhea: Secondary | ICD-10-CM

## 2019-01-11 DIAGNOSIS — R32 Unspecified urinary incontinence: Secondary | ICD-10-CM

## 2019-01-11 DIAGNOSIS — N816 Rectocele: Secondary | ICD-10-CM

## 2019-01-11 DIAGNOSIS — R159 Full incontinence of feces: Secondary | ICD-10-CM

## 2019-01-11 DIAGNOSIS — Z86010 Personal history of colonic polyps: Secondary | ICD-10-CM

## 2019-01-11 DIAGNOSIS — D508 Other iron deficiency anemias: Secondary | ICD-10-CM
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
## 2019-01-11 NOTE — Patient Instructions (Signed)
Your provider has requested that you go to the basement level for lab work before leaving today. Press "B" on the elevator. The lab is located at the first door on the left as you exit the elevator.   No more soda's per Dr Carlean Purl.   We have referred you for physical therapy in [facility]. They will contact you. There phone # is [phone].    You have been given a testing kit to check for small intestine bacterial overgrowth (SIBO) which is completed by a company named [employer]. Make sure to return your test in the mail using the return mailing label given you along with the kit. Your demographic and insurance information have already been sent to the company and they should be in contact with you over the next week regarding this test. Please keep in mind that you will be getting a call from phone number [phone] or a similar number. If you do not hear from them within this time frame, please call our office at [phone].     I appreciate the opportunity to care for you. Silvano Rusk, MD, [facility]

## 2019-01-12 ENCOUNTER — Ambulatory Visit
Admission: RE | Admit: 2019-01-12 | Discharge: 2019-01-12 | Disposition: A | Discharge status: Home / Self Care | Payer: Medicare Other | Source: Ambulatory Visit | Attending: Oncology | Admitting: Oncology

## 2019-01-12 DIAGNOSIS — C50412 Malignant neoplasm of upper-outer quadrant of left female breast: Secondary | ICD-10-CM | POA: Insufficient documentation

## 2019-01-12 DIAGNOSIS — Z170 Estrogen receptor positive status [ER+]: Secondary | ICD-10-CM | POA: Insufficient documentation

## 2019-01-12 DIAGNOSIS — N6489 Other specified disorders of breast: Secondary | ICD-10-CM | POA: Diagnosis not present

## 2019-01-12 DIAGNOSIS — N6320 Unspecified lump in the left breast, unspecified quadrant: Secondary | ICD-10-CM | POA: Diagnosis not present

## 2019-01-12 DIAGNOSIS — R928 Other abnormal and inconclusive findings on diagnostic imaging of breast: Secondary | ICD-10-CM | POA: Diagnosis not present

## 2019-01-12 LAB — TISSUE TRANSGLUTAMINASE, IGA: (tTG) Ab, IgA: 1 U/mL

## 2019-01-15 ENCOUNTER — Encounter: Payer: Self-pay | Admitting: Internal Medicine

## 2019-01-16 NOTE — Progress Notes (Signed)
Ms Mcaleese,  The test for gluten allergy (celiac disease) is negative meaning you do not have that.  Hope reducing and eliminating sodas is ghelping.  I know the PT will help.  Will contact you again when the breath test is done and resulted.  I appreciate the opportunity to care for you. Gatha Mayer, MD, [facility]

## 2019-01-18 ENCOUNTER — Other Ambulatory Visit: Payer: Self-pay

## 2019-01-18 ENCOUNTER — Ambulatory Visit: Payer: Medicare Other | Attending: Internal Medicine | Admitting: Physical Therapy

## 2019-01-18 ENCOUNTER — Encounter: Payer: Self-pay | Admitting: Physical Therapy

## 2019-01-18 DIAGNOSIS — R279 Unspecified lack of coordination: Secondary | ICD-10-CM | POA: Insufficient documentation

## 2019-01-18 DIAGNOSIS — R2689 Other abnormalities of gait and mobility: Secondary | ICD-10-CM | POA: Diagnosis not present

## 2019-01-18 DIAGNOSIS — M533 Sacrococcygeal disorders, not elsewhere classified: Secondary | ICD-10-CM | POA: Diagnosis not present

## 2019-01-18 NOTE — Patient Instructions (Addendum)
0 water,  5 glasses of tea and soda combined  Increase  water to 2 glasses at  room temp, decrease 2 soda and 2 teas  Through out for this week  Increase to 3 glasses for next week of water, and total up of 2 teas, 1 soda.   ________  Complete food diary   _______   Proper body mechanics with getting out of a chair to decrease strain  on back &pelvic floor   Avoid holding your breath when Getting out of the chair:  Scoot to front part of chair chair Heels behind feet, feet are hip width apart, nose over toes  Inhale like you are smelling roses Exhale to stand     Avoid straining pelvic floor, abdominal muscles , spine  Use log rolling technique instead of getting out of bed with your neck or the sit-up     Log rolling into and out of bed   Log rolling into and out of bed If getting out of bed on R side, Bent knees, scoot hips/ shoulder to L  Raise R arm completely overhead, rolling onto armpit  Then lower bent knees to bed to get into complete side lying position  Then drop legs off bed, and push up onto R elbow/forearm, and use L hand to push onto the bed   __  Wear R shoe lift
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
## 2019-01-20 DIAGNOSIS — K580 Irritable bowel syndrome with diarrhea: Secondary | ICD-10-CM | POA: Diagnosis not present

## 2019-01-26 ENCOUNTER — Other Ambulatory Visit: Payer: Self-pay | Admitting: *Deleted

## 2019-01-26 MED ORDER — DICYCLOMINE HCL 20 MG PO TABS: 20.0000 mg | ORAL_TABLET | Freq: Two times a day (BID) | ORAL | 0 refills | Status: DC

## 2019-01-30 ENCOUNTER — Other Ambulatory Visit: Payer: Self-pay

## 2019-01-30 ENCOUNTER — Ambulatory Visit: Payer: Medicare Other | Admitting: Physical Therapy

## 2019-01-30 DIAGNOSIS — R279 Unspecified lack of coordination: Secondary | ICD-10-CM | POA: Diagnosis not present

## 2019-01-30 DIAGNOSIS — R2689 Other abnormalities of gait and mobility: Secondary | ICD-10-CM | POA: Diagnosis not present

## 2019-01-30 DIAGNOSIS — M533 Sacrococcygeal disorders, not elsewhere classified: Secondary | ICD-10-CM | POA: Diagnosis not present
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
## 2019-01-30 NOTE — Patient Instructions (Addendum)
Consult a nutritionist   Variate breakfast foods instead of the same thing of waffles _ cheerios. raisin bran _cream of wheat with peaches   Incorporate veggies into lunch and dinner   _____  For better motility    _1) open book ( handout)   _2) deep core level 1 ( handout)    ____  During the day, side bend with arm over head  Several times

## 2019-02-01 ENCOUNTER — Telehealth: Payer: Self-pay | Admitting: Internal Medicine

## 2019-02-01 NOTE — Telephone Encounter (Signed)
Patient is asking if you have seen the results of her breath test?

## 2019-02-01 NOTE — Telephone Encounter (Signed)
Cont...pt called it cbol? Marland Kitchen

## 2019-02-01 NOTE — Telephone Encounter (Signed)
The breath test is normal.  She does not have SIBO.  Hope the PT is helping - look like she has benefited from a shoe lift for back pain.  I would recommend she come back to me in November - when PT is done

## 2019-02-02 NOTE — Telephone Encounter (Signed)
Left message for patient to call back  

## 2019-02-02 NOTE — Telephone Encounter (Signed)
Patient notified of the results and recommendations.  She will call back to make a OV when she completes her PT

## 2019-02-06 ENCOUNTER — Ambulatory Visit: Payer: Medicare Other | Admitting: Physical Therapy

## 2019-02-13 ENCOUNTER — Ambulatory Visit: Payer: Medicare Other | Admitting: Physical Therapy

## 2019-02-13 ENCOUNTER — Other Ambulatory Visit: Payer: Self-pay

## 2019-02-13 DIAGNOSIS — R2689 Other abnormalities of gait and mobility: Secondary | ICD-10-CM | POA: Diagnosis not present

## 2019-02-13 DIAGNOSIS — M533 Sacrococcygeal disorders, not elsewhere classified: Secondary | ICD-10-CM | POA: Diagnosis not present

## 2019-02-13 DIAGNOSIS — R279 Unspecified lack of coordination: Secondary | ICD-10-CM
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
## 2019-02-13 NOTE — Patient Instructions (Signed)
Stretches: 1) 6 directions of neck  (5 reps each direction)   2) Angel wings with arms dragging on bed, not lifted up  10 reps   3) Lengthen Back rib by _ shoulder    Lie on   side , pillow between knees  Pull  arm overhead over mattress, grab the edge of mattress,pull it upward, drawing elbow away from ears  Breathing    4) open book     Both sides   ___   Deep core level 1 ( make sure to not exhale loudly and not to exert upper belly muscles down but instead, exhale, pelvic lifts softly, then the lower belly sinks and then upper belly sinks    Deep core level 2 ( 6 min)    See handout

## 2019-02-15 ENCOUNTER — Other Ambulatory Visit: Payer: Self-pay | Admitting: Family Medicine

## 2019-02-22 ENCOUNTER — Telehealth: Payer: Self-pay | Admitting: Family Medicine

## 2019-02-22 DIAGNOSIS — R7303 Prediabetes: Secondary | ICD-10-CM

## 2019-02-22 DIAGNOSIS — D518 Other vitamin B12 deficiency anemias: Secondary | ICD-10-CM

## 2019-02-22 DIAGNOSIS — I10 Essential (primary) hypertension: Secondary | ICD-10-CM

## 2019-02-22 DIAGNOSIS — N289 Disorder of kidney and ureter, unspecified: Secondary | ICD-10-CM

## 2019-02-22 DIAGNOSIS — E7800 Pure hypercholesterolemia, unspecified: Secondary | ICD-10-CM

## 2019-02-22 NOTE — Telephone Encounter (Signed)
-----   Message from Ellamae Sia sent at 02/17/2019  9:55 AM EDT ----- Regarding: Lab orders for Thursday, 10.8.20 Patient is scheduled for CPX labs, please order future labs, Thanks , Karna Christmas

## 2019-02-23 ENCOUNTER — Ambulatory Visit: Payer: Medicare Other

## 2019-02-23 ENCOUNTER — Other Ambulatory Visit (INDEPENDENT_AMBULATORY_CARE_PROVIDER_SITE_OTHER): Payer: Medicare Other

## 2019-02-23 ENCOUNTER — Ambulatory Visit: Payer: Medicare Other | Attending: Internal Medicine | Admitting: Physical Therapy

## 2019-02-23 ENCOUNTER — Other Ambulatory Visit: Payer: Self-pay

## 2019-02-23 DIAGNOSIS — D518 Other vitamin B12 deficiency anemias: Secondary | ICD-10-CM | POA: Diagnosis not present

## 2019-02-23 DIAGNOSIS — R2689 Other abnormalities of gait and mobility: Secondary | ICD-10-CM | POA: Insufficient documentation

## 2019-02-23 DIAGNOSIS — R279 Unspecified lack of coordination: Secondary | ICD-10-CM | POA: Diagnosis not present

## 2019-02-23 DIAGNOSIS — M533 Sacrococcygeal disorders, not elsewhere classified: Secondary | ICD-10-CM | POA: Diagnosis not present

## 2019-02-23 DIAGNOSIS — I10 Essential (primary) hypertension: Secondary | ICD-10-CM

## 2019-02-23 DIAGNOSIS — E7800 Pure hypercholesterolemia, unspecified: Secondary | ICD-10-CM | POA: Diagnosis not present

## 2019-02-23 DIAGNOSIS — R7303 Prediabetes: Secondary | ICD-10-CM | POA: Diagnosis not present

## 2019-02-23 LAB — CBC WITH DIFFERENTIAL/PLATELET
Basophils Absolute: 0 10*3/uL (ref 0.0–0.1)
Basophils Relative: 0.5 % (ref 0.0–3.0)
Eosinophils Absolute: 0.3 10*3/uL (ref 0.0–0.7)
Eosinophils Relative: 3.3 % (ref 0.0–5.0)
HCT: 34.5 % — ABNORMAL LOW (ref 36.0–46.0)
Hemoglobin: 11.4 g/dL — ABNORMAL LOW (ref 12.0–15.0)
Lymphocytes Relative: 14.3 % (ref 12.0–46.0)
Lymphs Abs: 1.4 10*3/uL (ref 0.7–4.0)
MCHC: 33 g/dL (ref 30.0–36.0)
MCV: 92 fl (ref 78.0–100.0)
Monocytes Absolute: 0.8 10*3/uL (ref 0.1–1.0)
Monocytes Relative: 8.3 % (ref 3.0–12.0)
Neutro Abs: 7.2 10*3/uL (ref 1.4–7.7)
Neutrophils Relative %: 73.6 % (ref 43.0–77.0)
Platelets: 369 10*3/uL (ref 150.0–400.0)
RBC: 3.75 Mil/uL — ABNORMAL LOW (ref 3.87–5.11)
RDW: 13.1 % (ref 11.5–15.5)
WBC: 9.8 10*3/uL (ref 4.0–10.5)

## 2019-02-23 LAB — LIPID PANEL
Cholesterol: 133 mg/dL (ref 0–200)
HDL: 35.6 mg/dL — ABNORMAL LOW (ref >39.00)
NonHDL: 96.97
Total CHOL/HDL Ratio: 4
Triglycerides: 254 mg/dL — ABNORMAL HIGH (ref 0.0–149.0)
VLDL: 50.8 mg/dL — ABNORMAL HIGH (ref 0.0–40.0)

## 2019-02-23 LAB — COMPREHENSIVE METABOLIC PANEL
ALT: 7 U/L (ref 0–35)
AST: 10 U/L (ref 0–37)
Albumin: 3.9 g/dL (ref 3.5–5.2)
Alkaline Phosphatase: 44 U/L (ref 39–117)
BUN: 23 mg/dL (ref 6–23)
CO2: 28 mEq/L (ref 19–32)
Calcium: 9.5 mg/dL (ref 8.4–10.5)
Chloride: 102 mEq/L (ref 96–112)
Creatinine, Ser: 1.33 mg/dL — ABNORMAL HIGH (ref 0.40–1.20)
GFR: 38.71 mL/min — ABNORMAL LOW (ref >60.00)
Glucose, Bld: 108 mg/dL — ABNORMAL HIGH (ref 70–99)
Potassium: 4.6 mEq/L (ref 3.5–5.1)
Sodium: 138 mEq/L (ref 135–145)
Total Bilirubin: 0.4 mg/dL (ref 0.2–1.2)
Total Protein: 6.6 g/dL (ref 6.0–8.3)

## 2019-02-23 LAB — HEMOGLOBIN A1C: Hgb A1c MFr Bld: 6.1 % (ref 4.6–6.5)

## 2019-02-23 LAB — LDL CHOLESTEROL, DIRECT: Direct LDL: 63 mg/dL

## 2019-02-23 LAB — VITAMIN B12: Vitamin B-12: 477 pg/mL (ref 211–911)

## 2019-02-23 LAB — TSH: TSH: 1.25 u[IU]/mL (ref 0.35–4.50)
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
## 2019-02-23 NOTE — Patient Instructions (Signed)
Reclined position for deep core level 1 and 2

## 2019-02-28 ENCOUNTER — Encounter: Payer: Self-pay | Admitting: Family Medicine

## 2019-02-28 ENCOUNTER — Ambulatory Visit (INDEPENDENT_AMBULATORY_CARE_PROVIDER_SITE_OTHER): Payer: Medicare Other | Admitting: Family Medicine

## 2019-02-28 ENCOUNTER — Other Ambulatory Visit: Payer: Self-pay

## 2019-02-28 VITALS — BP 112/58 | HR 50 | Temp 97.1°F | Ht 63.25 in | Wt 202.1 lb

## 2019-02-28 DIAGNOSIS — E7800 Pure hypercholesterolemia, unspecified: Secondary | ICD-10-CM

## 2019-02-28 DIAGNOSIS — N1831 Chronic kidney disease, stage 3a: Secondary | ICD-10-CM | POA: Diagnosis not present

## 2019-02-28 DIAGNOSIS — Z170 Estrogen receptor positive status [ER+]: Secondary | ICD-10-CM

## 2019-02-28 DIAGNOSIS — E119 Type 2 diabetes mellitus without complications: Secondary | ICD-10-CM | POA: Diagnosis not present

## 2019-02-28 DIAGNOSIS — F3289 Other specified depressive episodes: Secondary | ICD-10-CM

## 2019-02-28 DIAGNOSIS — R7303 Prediabetes: Secondary | ICD-10-CM | POA: Diagnosis not present

## 2019-02-28 DIAGNOSIS — Z23 Encounter for immunization: Secondary | ICD-10-CM | POA: Diagnosis not present

## 2019-02-28 DIAGNOSIS — C50412 Malignant neoplasm of upper-outer quadrant of left female breast: Secondary | ICD-10-CM

## 2019-02-28 DIAGNOSIS — Z0000 Encounter for general adult medical examination without abnormal findings: Secondary | ICD-10-CM | POA: Diagnosis not present

## 2019-02-28 DIAGNOSIS — D631 Anemia in chronic kidney disease: Secondary | ICD-10-CM

## 2019-02-28 DIAGNOSIS — N289 Disorder of kidney and ureter, unspecified: Secondary | ICD-10-CM | POA: Diagnosis not present

## 2019-02-28 DIAGNOSIS — D518 Other vitamin B12 deficiency anemias: Secondary | ICD-10-CM | POA: Diagnosis not present

## 2019-02-28 DIAGNOSIS — I10 Essential (primary) hypertension: Secondary | ICD-10-CM

## 2019-02-28 DIAGNOSIS — Z6836 Body mass index (BMI) 36.0-36.9, adult: Secondary | ICD-10-CM | POA: Diagnosis not present

## 2019-02-28 MED ORDER — CYCLOBENZAPRINE HCL 10 MG PO TABS: ORAL_TABLET | ORAL | 1 refills | Status: DC

## 2019-02-28 MED ORDER — BENAZEPRIL HCL 20 MG PO TABS: 20.0000 mg | ORAL_TABLET | Freq: Every day | ORAL | 3 refills | Status: DC

## 2019-02-28 MED ORDER — CITALOPRAM HYDROBROMIDE 40 MG PO TABS: 40.0000 mg | ORAL_TABLET | Freq: Every day | ORAL | 3 refills | Status: DC

## 2019-02-28 MED ORDER — ATENOLOL 25 MG PO TABS: 12.5000 mg | ORAL_TABLET | Freq: Every day | ORAL | 3 refills | Status: DC

## 2019-02-28 MED ORDER — ROSUVASTATIN CALCIUM 10 MG PO TABS: 5.0000 mg | ORAL_TABLET | Freq: Every day | ORAL | 3 refills | Status: DC

## 2019-02-28 MED ORDER — FUROSEMIDE 20 MG PO TABS: 20.0000 mg | ORAL_TABLET | Freq: Every day | ORAL | 3 refills | Status: DC

## 2019-02-28 MED ORDER — OMEPRAZOLE 20 MG PO CPDR: 20.0000 mg | DELAYED_RELEASE_CAPSULE | Freq: Every day | ORAL | 3 refills | Status: DC

## 2019-02-28 MED ORDER — AMLODIPINE BESYLATE 10 MG PO TABS: 10.0000 mg | ORAL_TABLET | Freq: Every day | ORAL | 3 refills | Status: DC
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
## 2019-02-28 NOTE — Assessment & Plan Note (Signed)
Lab Results  Component Value Date   HGBA1C 6.1 02/23/2019   disc imp of low glycemic diet and wt loss to prevent DM2

## 2019-02-28 NOTE — Assessment & Plan Note (Signed)
bp in fair control at this time  BP Readings from Last 1 Encounters:  02/28/19 (!) 112/58   No changes needed Most recent labs reviewed  Disc lifstyle change with low sodium diet and exercise

## 2019-02-28 NOTE — Assessment & Plan Note (Signed)
Stable  Hb up to 11.4 Continue to follow

## 2019-02-28 NOTE — Assessment & Plan Note (Signed)
utd oncology care  For mammogram upcoming

## 2019-02-28 NOTE — Patient Instructions (Addendum)
Keep working on weight loss  If no improvement in sleep issues with a 5-10 lb loss we can re eval in the sleep clinic   If you are interested in the new shingles vaccine (Shingrix) - call your local pharmacy to check on coverage and availability  If affordable, get on a wait list at your pharmacy to get the vaccine.   For diabetes prevention  Try to get most of your carbohydrates from produce (with the exception of white potatoes)  Eat less bread/pasta/rice/snack foods/cereals/sweets and other items from the middle of the grocery store (processed carbs)   Keep exercising

## 2019-02-28 NOTE — Assessment & Plan Note (Signed)
Disc goals for lipids and reasons to control them Rev last labs with pt Rev low sat fat diet in detail  Fair control with crestor and diet   

## 2019-02-28 NOTE — Assessment & Plan Note (Signed)
Cr of 1.33 - enc strongly to inc water intake and avoid nsaids  Taking ace  ibs /diarrhea may dehydrate her at times

## 2019-02-28 NOTE — Assessment & Plan Note (Signed)
Discussed how this problem influences overall health and the risks it imposes  Reviewed plan for weight loss with lower calorie diet (via better food choices and also portion control or program like weight watchers) and exercise building up to or more than 30 minutes 5 days per week including some aerobic activity    

## 2019-02-28 NOTE — Assessment & Plan Note (Signed)
Continues citalopram Dose well as long as she stays on it

## 2019-02-28 NOTE — Assessment & Plan Note (Signed)
Reviewed health habits including diet and exercise and skin cancer prevention Reviewed appropriate screening tests for age  Also reviewed health mt list, fam hx and immunization status , as well as social and family history   See HPi Labs reviewed  Pt has discussed colon screening with her GI  Mammogram upcoming/utd breast cancer f/u Flu vaccine given  Disc shingrix/she will check on coverage Nl bmd on dexa  Advance directive is up to date Nl hearing screen  utd vision exam

## 2019-02-28 NOTE — Assessment & Plan Note (Signed)
Lab Results  Component Value Date   VITAMINB12 477 02/23/2019   Continues oral dose 1000 mcg daily

## 2019-03-02 ENCOUNTER — Ambulatory Visit: Payer: Medicare Other | Admitting: Physical Therapy

## 2019-03-08 ENCOUNTER — Telehealth: Payer: Self-pay | Admitting: Family Medicine

## 2019-03-08 NOTE — Telephone Encounter (Signed)
2nd call that I have received regarding this will route to [facility] and PCP

## 2019-03-08 NOTE — Telephone Encounter (Signed)
PAtietn stated she received a call that she is needing an A1C done but she was just in on the 8th for this. She is not sure why she received a phone call.  Are you aware of what this call could be about?

## 2019-03-09 NOTE — Telephone Encounter (Signed)
Thanks

## 2019-03-09 NOTE — Telephone Encounter (Signed)
Apparently, I was just made aware that EMMI calls went out over the past week to patient's with diabetes who were triggering as over due for diabetic follow up labs.   I will forward to Dr. Glori Bickers on this.  If patient is not due for anything then I would have [place] just follow up with patient and reassure her that she is up to date and we apologize for the call.  If she continues to get calls and would prefer, I can have Corene Cornea Occupational psychologist) have her name removed from the [facility] call list if she would like.   Thanks.

## 2019-03-10 NOTE — Telephone Encounter (Signed)
Pt notified of Ann Cummings's instructions and verbalized understanding. She thanked [place] for following up with her

## 2019-03-16 ENCOUNTER — Ambulatory Visit: Payer: Medicare Other | Admitting: Physical Therapy

## 2019-03-23 ENCOUNTER — Other Ambulatory Visit: Payer: Self-pay

## 2019-03-23 ENCOUNTER — Ambulatory Visit: Payer: Medicare Other | Attending: Internal Medicine | Admitting: Physical Therapy

## 2019-03-23 DIAGNOSIS — R279 Unspecified lack of coordination: Secondary | ICD-10-CM | POA: Diagnosis not present

## 2019-03-23 DIAGNOSIS — R2689 Other abnormalities of gait and mobility: Secondary | ICD-10-CM | POA: Insufficient documentation

## 2019-03-23 DIAGNOSIS — M533 Sacrococcygeal disorders, not elsewhere classified: Secondary | ICD-10-CM | POA: Diagnosis not present

## 2019-03-23 NOTE — Patient Instructions (Signed)
Look over the research regarding sleep apnea (OSA)  on the co-morbidities you have and decide on whether you want to go ahead to get screened for OSA .  Find out if your insurance can cover the sleep unit to do the test at home since it will b e easier for your daughter not to drive you  Follow up with your doctor on your decision    ____  Articles on OSA, metabolism, and treatment of OSA for weightloss  https://www.mdedge.com/chestphysician/article/197827/sleep-medicine/cpap-use-associated-greater-weight-loss-obese-patients ""Patients with OSA who are prescribed weight loss should also be considered for CPAP for the goal of weight loss," Dr. Ronny Bacon said. "We think this therapy should be started right away."  https://www.sciencedaily.com/releases/2017/08/170831101454.htm   "But Jun says that the study provides further evidence that sleep apnea isn't just a manifestation of obesity, diabetes and cardiovascular disease -- it can directly aggravate these conditions."   Effect on Asthma   https://bowen-mcdonald.org/ PrepaidPayroll.ca    Cardiovascular  CobrandedAffiliateProgram.com.cy   Effect on Nocturia Raheem O et al. Clinical predictors of nocturia in the sleep apnea population. Urology Annals. 2014. 6 (1): 31-35.  Chang A et al. Management of Nocturia in the Female. Current Urology Reports. 2015. 16.(3) :485.  Articles: HDTVGame.dk  http://www.taylor.net/  ___   Clear the area around the treadmill, Create motivation strategies ( pictures of your cat, taped to the bars or the clock)   Wear tennis shoes next visit for treadmill assessment    Keep  up the good work with heal;ither eating and drinking water   ____    Marching in chair for 3 min  For cardio  Keep up with stretches and Deep core level 1 ( quick blueberry squeeze) and deep core level 2 ( knee out 6 min) 2 x day
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
## 2019-03-30 ENCOUNTER — Ambulatory Visit: Payer: Medicare Other | Admitting: Physical Therapy

## 2019-03-30 ENCOUNTER — Other Ambulatory Visit: Payer: Self-pay

## 2019-03-30 DIAGNOSIS — R279 Unspecified lack of coordination: Secondary | ICD-10-CM

## 2019-03-30 DIAGNOSIS — M533 Sacrococcygeal disorders, not elsewhere classified: Secondary | ICD-10-CM

## 2019-03-30 DIAGNOSIS — R2689 Other abnormalities of gait and mobility: Secondary | ICD-10-CM | POA: Diagnosis not present
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
## 2019-03-30 NOTE — Patient Instructions (Addendum)
Calming practices in seated position when you feel stomach pain   Lengthened breathing to calm asthma and stomach 1-2- pause, inhale  2-1 pause exhale    Feet firm on these seated exercises:   Seated twist  3 breaths      Towel behind back  3 breaths       Cat cow  Brushing hands on thigh , slouch, then chest lifts, elbow back, palm brush back 5 reps        Seated position with hands ( fists) behind oyur hips on the chair to stretch the bottom of belly  5 breaths

## 2019-04-03 ENCOUNTER — Other Ambulatory Visit: Payer: Self-pay

## 2019-04-03 ENCOUNTER — Inpatient Hospital Stay: Payer: Medicare Other | Attending: Oncology

## 2019-04-03 DIAGNOSIS — I129 Hypertensive chronic kidney disease with stage 1 through stage 4 chronic kidney disease, or unspecified chronic kidney disease: Secondary | ICD-10-CM | POA: Insufficient documentation

## 2019-04-03 DIAGNOSIS — E538 Deficiency of other specified B group vitamins: Secondary | ICD-10-CM | POA: Diagnosis not present

## 2019-04-03 DIAGNOSIS — D508 Other iron deficiency anemias: Secondary | ICD-10-CM

## 2019-04-03 DIAGNOSIS — N183 Chronic kidney disease, stage 3 unspecified: Secondary | ICD-10-CM | POA: Insufficient documentation

## 2019-04-03 DIAGNOSIS — D631 Anemia in chronic kidney disease: Secondary | ICD-10-CM | POA: Insufficient documentation

## 2019-04-03 DIAGNOSIS — Z170 Estrogen receptor positive status [ER+]: Secondary | ICD-10-CM | POA: Insufficient documentation

## 2019-04-03 DIAGNOSIS — C50412 Malignant neoplasm of upper-outer quadrant of left female breast: Secondary | ICD-10-CM | POA: Diagnosis not present

## 2019-04-03 DIAGNOSIS — Z79899 Other long term (current) drug therapy: Secondary | ICD-10-CM | POA: Insufficient documentation

## 2019-04-03 LAB — COMPREHENSIVE METABOLIC PANEL
ALT: 10 U/L (ref 0–44)
AST: 13 U/L — ABNORMAL LOW (ref 15–41)
Albumin: 3.5 g/dL (ref 3.5–5.0)
Alkaline Phosphatase: 42 U/L (ref 38–126)
Anion gap: 5 (ref 5–15)
BUN: 21 mg/dL (ref 8–23)
CO2: 25 mmol/L (ref 22–32)
Calcium: 8.5 mg/dL — ABNORMAL LOW (ref 8.9–10.3)
Chloride: 106 mmol/L (ref 98–111)
Creatinine, Ser: 1.12 mg/dL — ABNORMAL HIGH (ref 0.44–1.00)
GFR calc Af Amer: 55 mL/min — ABNORMAL LOW (ref >60)
GFR calc non Af Amer: 48 mL/min — ABNORMAL LOW (ref >60)
Glucose, Bld: 86 mg/dL (ref 70–99)
Potassium: 4.3 mmol/L (ref 3.5–5.1)
Sodium: 136 mmol/L (ref 135–145)
Total Bilirubin: 0.4 mg/dL (ref 0.3–1.2)
Total Protein: 6.5 g/dL (ref 6.5–8.1)

## 2019-04-03 LAB — CBC WITH DIFFERENTIAL/PLATELET
Abs Immature Granulocytes: 0.05 10*3/uL (ref 0.00–0.07)
Basophils Absolute: 0 10*3/uL (ref 0.0–0.1)
Basophils Relative: 0 %
Eosinophils Absolute: 0.3 10*3/uL (ref 0.0–0.5)
Eosinophils Relative: 2 %
HCT: 32.4 % — ABNORMAL LOW (ref 36.0–46.0)
Hemoglobin: 10.7 g/dL — ABNORMAL LOW (ref 12.0–15.0)
Immature Granulocytes: 0 %
Lymphocytes Relative: 11 %
Lymphs Abs: 1.3 10*3/uL (ref 0.7–4.0)
MCH: 30.1 pg (ref 26.0–34.0)
MCHC: 33 g/dL (ref 30.0–36.0)
MCV: 91 fL (ref 80.0–100.0)
Monocytes Absolute: 0.8 10*3/uL (ref 0.1–1.0)
Monocytes Relative: 7 %
Neutro Abs: 8.9 10*3/uL — ABNORMAL HIGH (ref 1.7–7.7)
Neutrophils Relative %: 80 %
Platelets: 324 10*3/uL (ref 150–400)
RBC: 3.56 MIL/uL — ABNORMAL LOW (ref 3.87–5.11)
RDW: 12.8 % (ref 11.5–15.5)
WBC: 11.4 10*3/uL — ABNORMAL HIGH (ref 4.0–10.5)
nRBC: 0 % (ref 0.0–0.2)

## 2019-04-03 LAB — IRON AND TIBC
Iron: 40 ug/dL (ref 28–170)
Saturation Ratios: 14 % (ref 10.4–31.8)
TIBC: 293 ug/dL (ref 250–450)
UIBC: 253 ug/dL

## 2019-04-03 LAB — FERRITIN: Ferritin: 114 ng/mL (ref 11–307)

## 2019-04-04 ENCOUNTER — Ambulatory Visit: Payer: Medicare Other | Admitting: Physical Therapy

## 2019-04-04 ENCOUNTER — Other Ambulatory Visit: Payer: Self-pay

## 2019-04-04 NOTE — Progress Notes (Signed)
Patient pre screened for office appointment, no questions or concerns today. Patient reminded of upcoming appointment time and date. 

## 2019-04-05 ENCOUNTER — Other Ambulatory Visit: Payer: Self-pay

## 2019-04-05 ENCOUNTER — Encounter: Payer: Self-pay | Admitting: Oncology

## 2019-04-05 ENCOUNTER — Inpatient Hospital Stay (HOSPITAL_BASED_OUTPATIENT_CLINIC_OR_DEPARTMENT_OTHER): Payer: Medicare Other | Admitting: Oncology

## 2019-04-05 ENCOUNTER — Inpatient Hospital Stay: Payer: Medicare Other

## 2019-04-05 VITALS — BP 151/70 | HR 57 | Temp 96.3°F | Resp 18 | Wt 203.0 lb

## 2019-04-05 DIAGNOSIS — I129 Hypertensive chronic kidney disease with stage 1 through stage 4 chronic kidney disease, or unspecified chronic kidney disease: Secondary | ICD-10-CM | POA: Diagnosis not present

## 2019-04-05 DIAGNOSIS — C50412 Malignant neoplasm of upper-outer quadrant of left female breast: Secondary | ICD-10-CM | POA: Diagnosis not present

## 2019-04-05 DIAGNOSIS — D518 Other vitamin B12 deficiency anemias: Secondary | ICD-10-CM | POA: Diagnosis not present

## 2019-04-05 DIAGNOSIS — D631 Anemia in chronic kidney disease: Secondary | ICD-10-CM

## 2019-04-05 DIAGNOSIS — Z170 Estrogen receptor positive status [ER+]: Secondary | ICD-10-CM

## 2019-04-05 DIAGNOSIS — N1831 Chronic kidney disease, stage 3a: Secondary | ICD-10-CM

## 2019-04-05 DIAGNOSIS — N183 Chronic kidney disease, stage 3 unspecified: Secondary | ICD-10-CM | POA: Diagnosis not present

## 2019-04-05 DIAGNOSIS — E538 Deficiency of other specified B group vitamins: Secondary | ICD-10-CM | POA: Diagnosis not present
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
## 2019-04-05 NOTE — Progress Notes (Signed)
Patient has noticed left nipple has a dry patching area that does itch.

## 2019-04-06 ENCOUNTER — Encounter: Payer: Medicare Other | Admitting: Physical Therapy

## 2019-04-18 ENCOUNTER — Other Ambulatory Visit: Payer: Self-pay

## 2019-04-18 ENCOUNTER — Ambulatory Visit: Payer: Medicare Other | Attending: Internal Medicine | Admitting: Physical Therapy

## 2019-04-18 DIAGNOSIS — R279 Unspecified lack of coordination: Secondary | ICD-10-CM | POA: Insufficient documentation

## 2019-04-18 DIAGNOSIS — R2689 Other abnormalities of gait and mobility: Secondary | ICD-10-CM | POA: Insufficient documentation

## 2019-04-18 DIAGNOSIS — M533 Sacrococcygeal disorders, not elsewhere classified: Secondary | ICD-10-CM | POA: Diagnosis not present
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
## 2019-04-18 NOTE — Patient Instructions (Addendum)
Instead of using the treadmill   Use the portable cycler  Raise higher with folded blankets to knee knees below hips Use arm rests to press forearms/ shoulders down   ____  After seated marches and cycling  _ stretch quads : Spear throwing  Turned 45 deg on chair,  Outer leg back, knee pint to ground  Outer arm overhead    _stretch gluts  _ figure-4  crossing ankle over opp thigh   hands behind hip  lean forward,    _ stretch hamstrings Sit in a V  One knee straight, lean towards it    _     Transition from standing to floor if you have hypertension or can not have your head lower than your heart  Wide squat in front of a CHAIR  Forearms onto the chair Lower knees down into double kneeling   floor   To get up Walk on your knees to the front of chair again Forearms onto the chair Inhale, exhale, pushing onto the forearms to life  lifts hips up, kneeping knees bent hands on thighs, then hips then pause HERE  to avoid (moving too quickly up/ blood rush) -->  knees glide forward and roll  Hips up instead of hinging spine up   * KEEP YOUR HEAD AND HEART LEVELLED , NEVER LETTING YOUR HEAD GET BELOW YOUR HEART

## 2019-04-27 ENCOUNTER — Other Ambulatory Visit: Payer: Self-pay

## 2019-04-27 ENCOUNTER — Ambulatory Visit
Admission: RE | Admit: 2019-04-27 | Discharge: 2019-04-27 | Disposition: A | Discharge status: Home / Self Care | Payer: Medicare Other | Source: Ambulatory Visit | Attending: Radiation Oncology | Admitting: Radiation Oncology

## 2019-04-27 ENCOUNTER — Encounter: Payer: Self-pay | Admitting: Radiation Oncology

## 2019-04-27 VITALS — BP 147/60 | HR 52 | Temp 98.1°F | Resp 18 | Wt 202.4 lb

## 2019-04-27 DIAGNOSIS — Z170 Estrogen receptor positive status [ER+]: Secondary | ICD-10-CM

## 2019-04-27 DIAGNOSIS — Z79899 Other long term (current) drug therapy: Secondary | ICD-10-CM | POA: Diagnosis not present

## 2019-04-27 DIAGNOSIS — Z853 Personal history of malignant neoplasm of breast: Secondary | ICD-10-CM | POA: Insufficient documentation

## 2019-04-27 DIAGNOSIS — E538 Deficiency of other specified B group vitamins: Secondary | ICD-10-CM | POA: Diagnosis not present

## 2019-04-27 DIAGNOSIS — D649 Anemia, unspecified: Secondary | ICD-10-CM | POA: Diagnosis not present

## 2019-04-27 NOTE — Progress Notes (Signed)
Radiation Oncology Follow up Note  Name: Ann Cummings   Date:   04/27/2019 MRN:  [id number] DOB: 1940-09-26    This 78 y.o. female presents to the clinic today for 23-month follow-up status post accelerated partial breast irradiation to her left breast for stage I ER/PR positive invasive mammary carcinoma.  REFERRING PROVIDER: Tower, [name], MD  HPI: Patient is a 78 year old female now about 18 months having completed accelerated partial breast irradiation to her left breast for stage I ER/PR positive invasive mammary carcinoma.  Seen today in routine follow-up she is doing well.  She specifically denies breast tenderness cough or bone pain..  She had mammogram and ultrasound back in August which was BI-RADS 2 benign.  Patient is not on antiestrogen therapy has multifactorial anemia and anemia B12 deficiency  COMPLICATIONS OF TREATMENT: none  FOLLOW UP COMPLIANCE: keeps appointments   PHYSICAL EXAM:  BP (!) 147/60 (BP Location: Left Arm, Patient Position: Sitting)   Pulse (!) 52   Temp 98.1 F (36.7 C) (Tympanic)   Resp 18   Wt 202 lb 6.4 oz (91.8 kg)   BMI 35.57 kg/m  Lungs are clear to A&P cardiac examination essentially unremarkable with regular rate and rhythm. No dominant mass or nodularity is noted in either breast in 2 positions examined. Incision is well-healed. No axillary or supraclavicular adenopathy is appreciated. Cosmetic result is excellent.  Well-developed well-nourished patient in NAD. HEENT reveals PERLA, EOMI, discs not visualized.  Oral cavity is clear. No oral mucosal lesions are identified. Neck is clear without evidence of cervical or supraclavicular adenopathy. Lungs are clear to A&P. Cardiac examination is essentially unremarkable with regular rate and rhythm without murmur rub or thrill. Abdomen is benign with no organomegaly or masses noted. Motor sensory and DTR levels are equal and symmetric in the upper and lower extremities. Cranial nerves II through XII  are grossly intact. Proprioception is intact. No peripheral adenopathy or edema is identified. No motor or sensory levels are noted. Crude visual fields are within normal range.  RADIOLOGY RESULTS: Mammograms reviewed compatible with above-stated findings  PLAN: Present time patient now 18 months out from accelerated partial breast radiation and is doing well with no evidence of disease.  I am pleased with her overall progress.  I have asked to see her back in 1 year for follow-up.  Patient knows to call sooner with any concerns.  She is already been scheduled for follow-up mammograms in February.  I would like to take this opportunity to thank you for allowing me to participate in the care of your patient.Noreene Filbert, MD

## 2019-05-02 ENCOUNTER — Ambulatory Visit: Payer: Medicare Other | Admitting: Physical Therapy

## 2019-05-05 ENCOUNTER — Other Ambulatory Visit: Payer: Self-pay | Admitting: Family Medicine

## 2019-05-14 IMAGING — MG MM DIGITAL DIAGNOSTIC UNILAT*L* W/ TOMO W/ CAD
7 series · 8 of 15 positions shown · non-contrast
Comparison: Previous exam(s).

CLINICAL DATA: Left breast possible masses seen on most recent
screening mammography.

EXAM:
DIGITAL DIAGNOSTIC LEFT MAMMOGRAM WITH CAD AND TOMO
ULTRASOUND LEFT BREAST

[L MLO synth-2D]
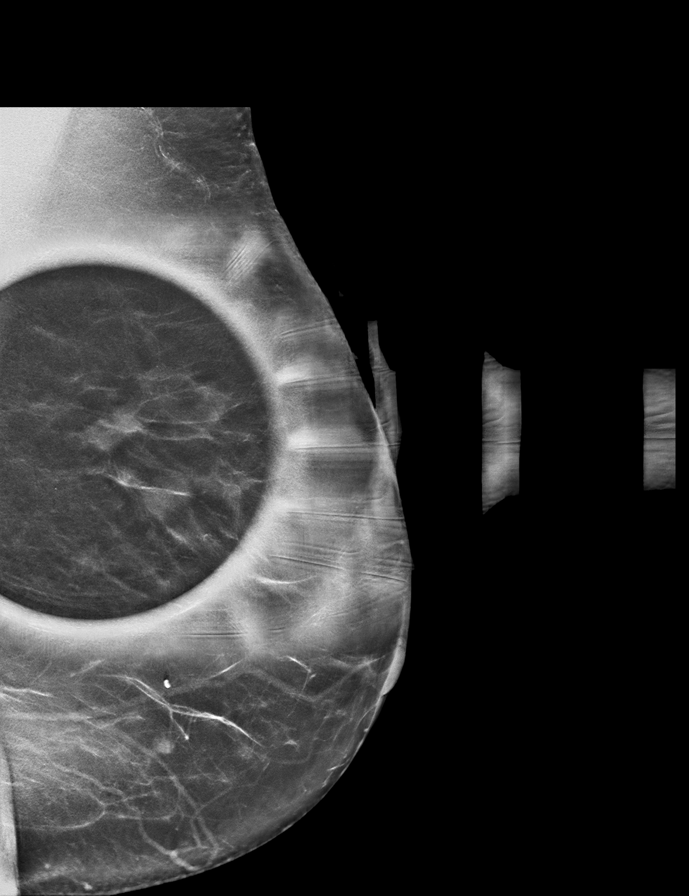

[L CC (1 of 2)]
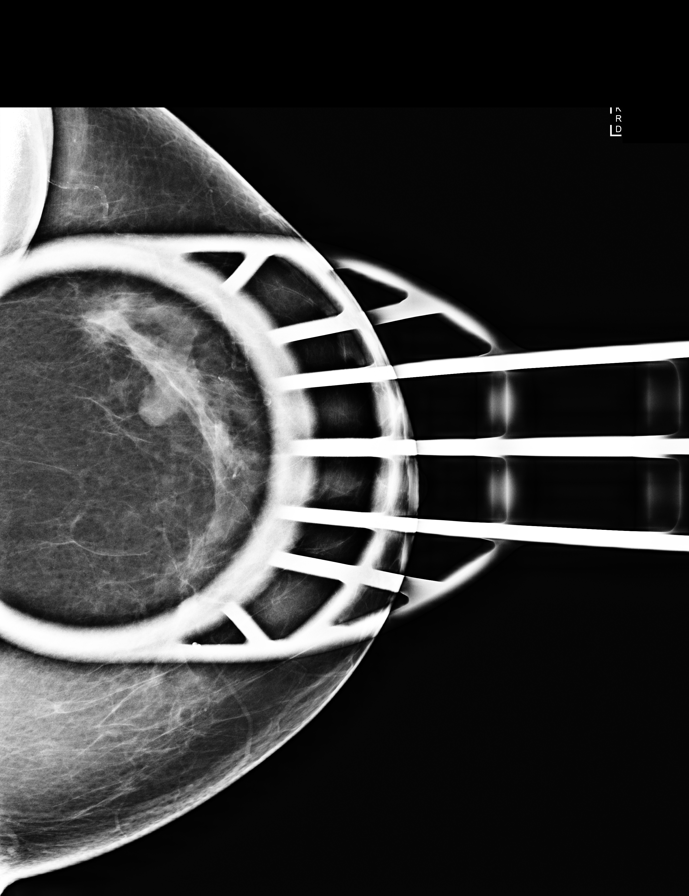

[L CC synth-2D]
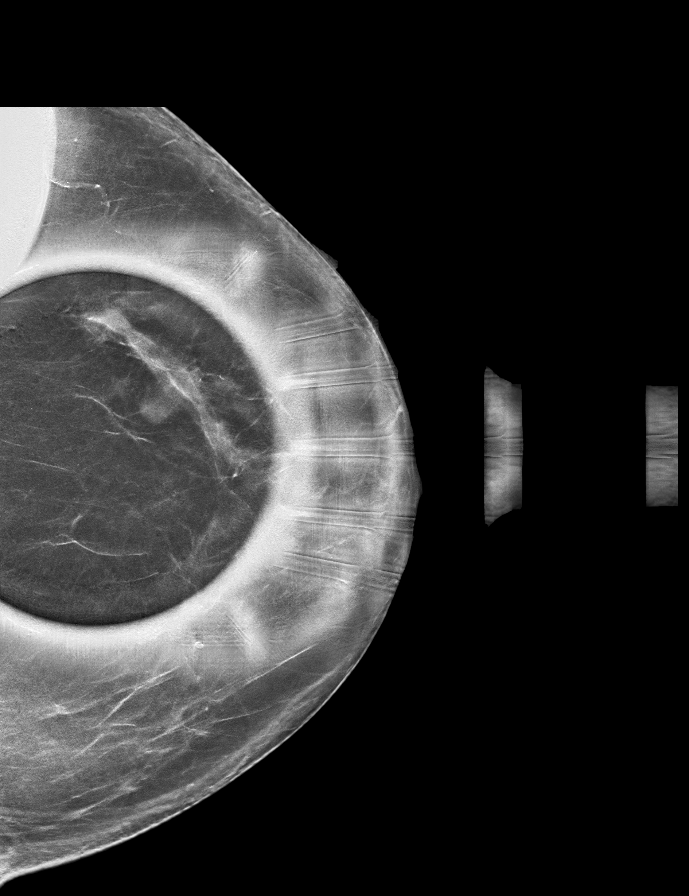

[L MLO]
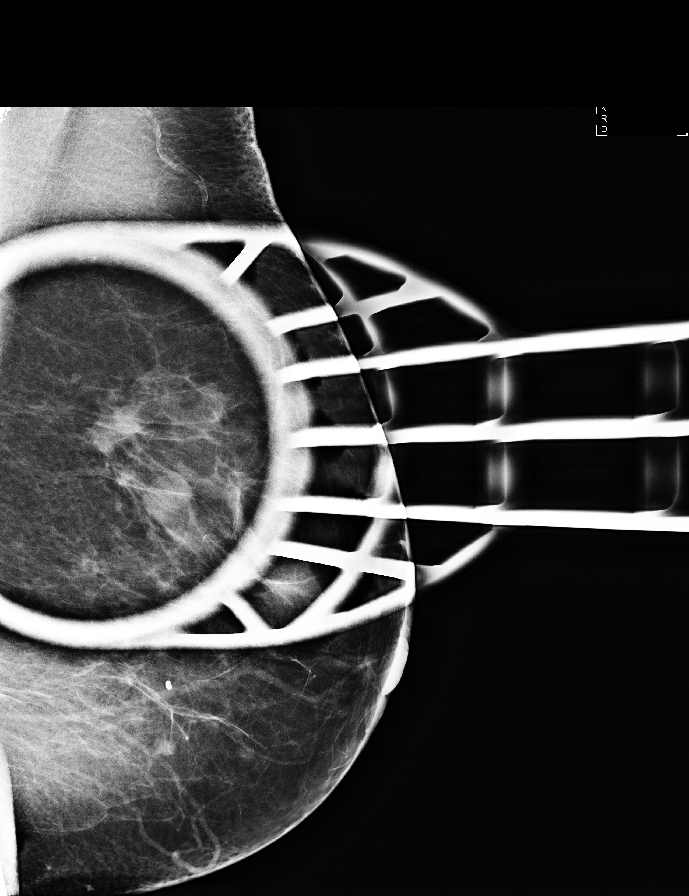

[L CC tomo · 2 of 64 frames shown]
[frame 21/64]
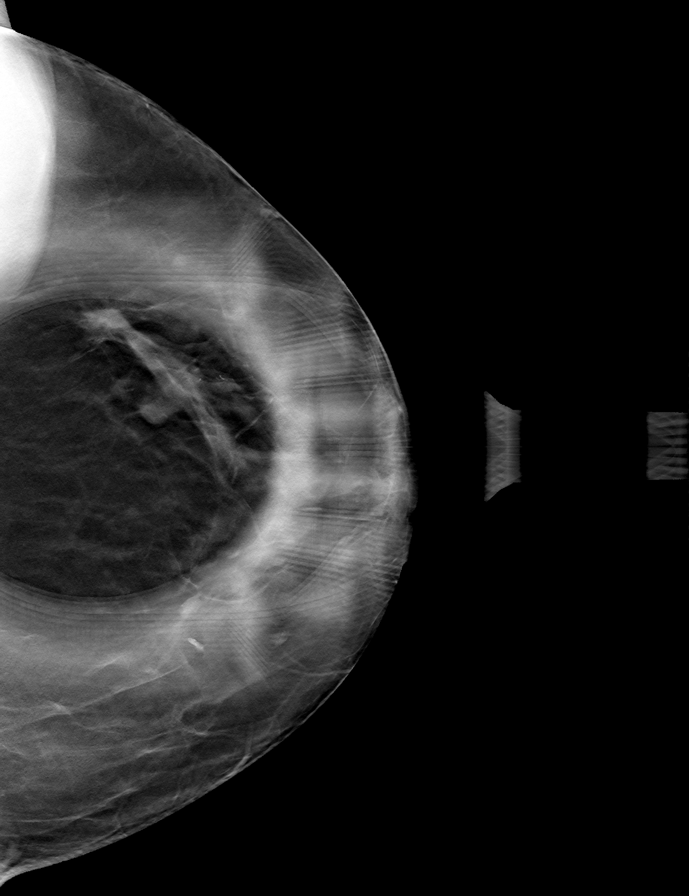
[frame 33/64]
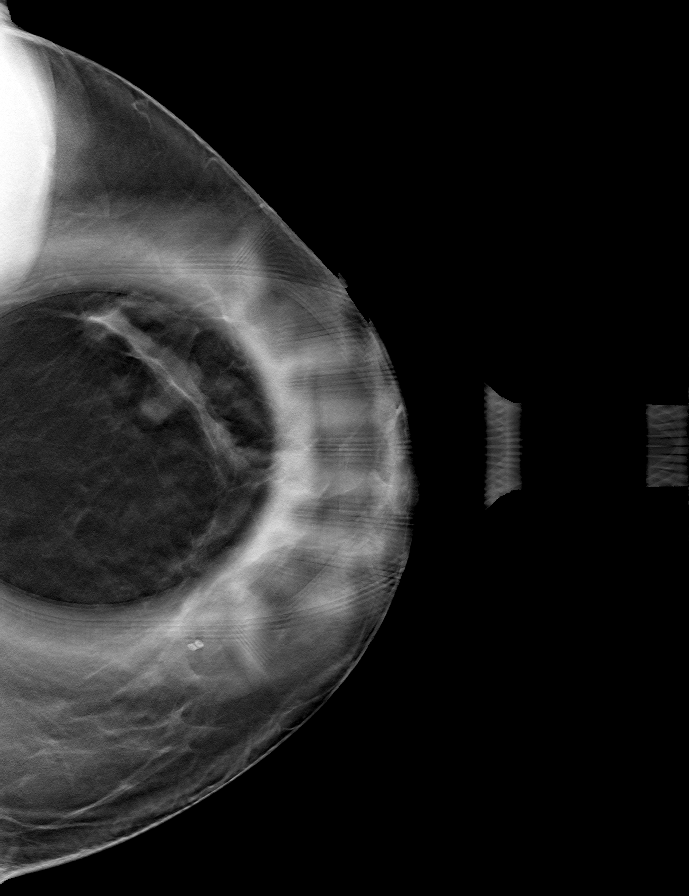

[L MLO tomo · tomo slice 32/63.0]
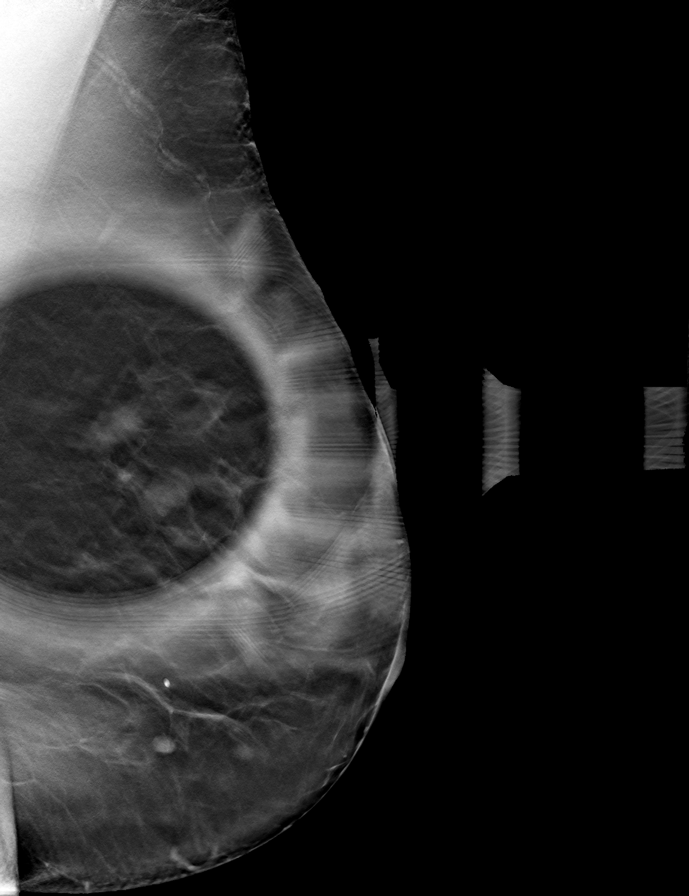

[L CC (2 of 2)]
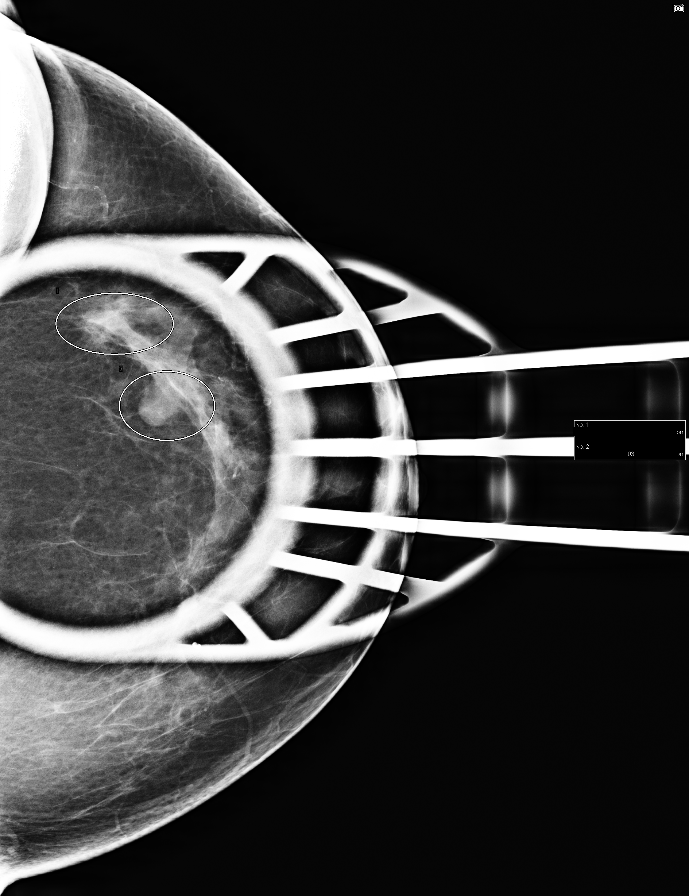

[8 of 15 positions shown; findings below may reference images not displayed]

ACR Breast Density Category c: The breast tissue is heterogeneously
dense, which may obscure small masses.
FINDINGS: Mammographically, there is a persistent irregular mass in the left
breast upper outer quadrant, posterior depth, circumscribed mass in
the left breast upper outer quadrant, middle depth and 7 mm
circumscribed nodule in the left breast lower inner quadrant, middle
depth. There is also a slightly irregularly-shaped lymph node in the
left breast upper outer quadrant, posterior depth, seen on the
screening mammogram.

Mammographic images were processed with CAD.

On physical exam, there are 2 palpable masses in the left breast
upper outer quadrant, middle and posterior depth.

Targeted ultrasound is performed, showing left breast 2 o'clock 5 cm
from the nipple spiculated mass measuring 1.0 x 1.2 x 1.1 cm. This
corresponds to the more posterior of the 2 mammographic masses in
the left breast upper outer quadrant. Additionally, in the left
breast 2 o'clock 3 cm from the nipple, there is a bilobed
circumscribed mass measuring 1.2 x 0.9 x 0.7 cm. The distance
between the 2 masses is 2.3 cm. In the left breast 2 o'clock 9 cm
from the nipple, there is a benign-appearing intramammary lymph node
measuring 5 mm, which corresponds to the third tiny nodule in the
left breast upper outer quadrant, far posterior depth. In the left
breast 8 o'clock 4 cm from the nipple, there is a complicated cyst
versus solid nodule which measures 0.6 x 0.5 x 0.3 cm. There is no
evidence of left axillary lymphadenopathy.
IMPRESSION: Suspicious left breast 2 o'clock 5 cm from the nipple 1.2 cm mass.

Two other circumscribed masses in the left breast, at 2 o'clock 3 cm
from the nipple and 8 o'clock 4 cm from the nipple, with more benign
appearance, however ultrasound-guided core needle biopsy is also
recommended , as they do not have the appearance of simple cysts.

No evidence of left axillary lymphadenopathy.

RECOMMENDATION:
Ultrasound-guided core needle biopsy of 3 left breast masses.

I have discussed the findings and recommendations with the patient.
Results were also provided in writing at the conclusion of the
visit. If applicable, a reminder letter will be sent to the patient
regarding the next appointment.

BI-RADS CATEGORY  4: Suspicious.

## 2019-05-31 ENCOUNTER — Ambulatory Visit: Payer: Medicare Other | Admitting: Physical Therapy

## 2019-06-05 ENCOUNTER — Encounter: Payer: Self-pay | Admitting: Internal Medicine

## 2019-06-24 ENCOUNTER — Other Ambulatory Visit: Payer: Self-pay | Admitting: Family Medicine

## 2019-07-05 ENCOUNTER — Encounter: Payer: Medicare Other | Admitting: Physical Therapy

## 2019-07-13 ENCOUNTER — Ambulatory Visit
Admission: RE | Admit: 2019-07-13 | Discharge: 2019-07-13 | Disposition: A | Discharge status: Home / Self Care | Payer: Medicare Other | Source: Ambulatory Visit | Attending: Oncology | Admitting: Oncology

## 2019-07-13 DIAGNOSIS — R928 Other abnormal and inconclusive findings on diagnostic imaging of breast: Secondary | ICD-10-CM | POA: Diagnosis not present

## 2019-07-13 DIAGNOSIS — Z170 Estrogen receptor positive status [ER+]: Secondary | ICD-10-CM | POA: Diagnosis not present

## 2019-07-13 DIAGNOSIS — C50412 Malignant neoplasm of upper-outer quadrant of left female breast: Secondary | ICD-10-CM

## 2019-07-17 ENCOUNTER — Other Ambulatory Visit: Payer: Self-pay

## 2019-07-17 ENCOUNTER — Inpatient Hospital Stay (HOSPITAL_BASED_OUTPATIENT_CLINIC_OR_DEPARTMENT_OTHER): Payer: Medicare Other | Admitting: Oncology

## 2019-07-17 ENCOUNTER — Inpatient Hospital Stay: Payer: Medicare Other | Attending: Oncology

## 2019-07-17 ENCOUNTER — Encounter: Payer: Self-pay | Admitting: Oncology

## 2019-07-17 VITALS — BP 126/64 | HR 60 | Temp 97.5°F | Wt 204.4 lb

## 2019-07-17 DIAGNOSIS — C50412 Malignant neoplasm of upper-outer quadrant of left female breast: Secondary | ICD-10-CM

## 2019-07-17 DIAGNOSIS — Z170 Estrogen receptor positive status [ER+]: Secondary | ICD-10-CM

## 2019-07-17 DIAGNOSIS — I129 Hypertensive chronic kidney disease with stage 1 through stage 4 chronic kidney disease, or unspecified chronic kidney disease: Secondary | ICD-10-CM | POA: Diagnosis not present

## 2019-07-17 DIAGNOSIS — D518 Other vitamin B12 deficiency anemias: Secondary | ICD-10-CM

## 2019-07-17 DIAGNOSIS — E1122 Type 2 diabetes mellitus with diabetic chronic kidney disease: Secondary | ICD-10-CM | POA: Diagnosis not present

## 2019-07-17 DIAGNOSIS — D508 Other iron deficiency anemias: Secondary | ICD-10-CM

## 2019-07-17 DIAGNOSIS — Z79899 Other long term (current) drug therapy: Secondary | ICD-10-CM | POA: Insufficient documentation

## 2019-07-17 DIAGNOSIS — N189 Chronic kidney disease, unspecified: Secondary | ICD-10-CM | POA: Diagnosis not present

## 2019-07-17 DIAGNOSIS — D631 Anemia in chronic kidney disease: Secondary | ICD-10-CM | POA: Insufficient documentation

## 2019-07-17 DIAGNOSIS — D519 Vitamin B12 deficiency anemia, unspecified: Secondary | ICD-10-CM | POA: Diagnosis not present

## 2019-07-17 LAB — CBC WITH DIFFERENTIAL/PLATELET
Abs Immature Granulocytes: 0.04 10*3/uL (ref 0.00–0.07)
Basophils Absolute: 0 10*3/uL (ref 0.0–0.1)
Basophils Relative: 1 %
Eosinophils Absolute: 0.3 10*3/uL (ref 0.0–0.5)
Eosinophils Relative: 4 %
HCT: 33 % — ABNORMAL LOW (ref 36.0–46.0)
Hemoglobin: 10.6 g/dL — ABNORMAL LOW (ref 12.0–15.0)
Immature Granulocytes: 1 %
Lymphocytes Relative: 20 %
Lymphs Abs: 1.5 10*3/uL (ref 0.7–4.0)
MCH: 29.5 pg (ref 26.0–34.0)
MCHC: 32.1 g/dL (ref 30.0–36.0)
MCV: 91.9 fL (ref 80.0–100.0)
Monocytes Absolute: 0.6 10*3/uL (ref 0.1–1.0)
Monocytes Relative: 8 %
Neutro Abs: 5.1 10*3/uL (ref 1.7–7.7)
Neutrophils Relative %: 66 %
Platelets: 321 10*3/uL (ref 150–400)
RBC: 3.59 MIL/uL — ABNORMAL LOW (ref 3.87–5.11)
RDW: 12.6 % (ref 11.5–15.5)
WBC: 7.5 10*3/uL (ref 4.0–10.5)
nRBC: 0 % (ref 0.0–0.2)

## 2019-07-17 LAB — COMPREHENSIVE METABOLIC PANEL
ALT: 10 U/L (ref 0–44)
AST: 13 U/L — ABNORMAL LOW (ref 15–41)
Albumin: 3.5 g/dL (ref 3.5–5.0)
Alkaline Phosphatase: 39 U/L (ref 38–126)
Anion gap: 10 (ref 5–15)
BUN: 25 mg/dL — ABNORMAL HIGH (ref 8–23)
CO2: 24 mmol/L (ref 22–32)
Calcium: 8.6 mg/dL — ABNORMAL LOW (ref 8.9–10.3)
Chloride: 104 mmol/L (ref 98–111)
Creatinine, Ser: 1.31 mg/dL — ABNORMAL HIGH (ref 0.44–1.00)
GFR calc Af Amer: 45 mL/min — ABNORMAL LOW (ref >60)
GFR calc non Af Amer: 39 mL/min — ABNORMAL LOW (ref >60)
Glucose, Bld: 108 mg/dL — ABNORMAL HIGH (ref 70–99)
Potassium: 4.3 mmol/L (ref 3.5–5.1)
Sodium: 138 mmol/L (ref 135–145)
Total Bilirubin: 0.3 mg/dL (ref 0.3–1.2)
Total Protein: 6.5 g/dL (ref 6.5–8.1)

## 2019-07-17 LAB — IRON AND TIBC
Iron: 46 ug/dL (ref 28–170)
Saturation Ratios: 15 % (ref 10.4–31.8)
TIBC: 308 ug/dL (ref 250–450)
UIBC: 262 ug/dL

## 2019-07-17 LAB — FERRITIN: Ferritin: 88 ng/mL (ref 11–307)

## 2019-07-17 NOTE — Progress Notes (Signed)
Patient here for follow up. Pt reports increased tiredness. Wants to know if iron can be checked. No new breast problem voiced.
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
## 2019-07-18 ENCOUNTER — Telehealth: Payer: Self-pay

## 2019-07-18 NOTE — Telephone Encounter (Signed)
Done..  oppt for Venofer x1 has been scheduled. Pts daughter [name] is aware Of the scheduled appt date and time.

## 2019-07-18 NOTE — Telephone Encounter (Signed)
-----   Message from Earlie Server, MD sent at 07/17/2019 10:20 PM EST ----- Please arrange her to get 1 dose of Venofer. Thank you.

## 2019-07-19 ENCOUNTER — Ambulatory Visit (INDEPENDENT_AMBULATORY_CARE_PROVIDER_SITE_OTHER): Payer: Medicare Other | Admitting: Surgery

## 2019-07-19 ENCOUNTER — Other Ambulatory Visit: Payer: Self-pay

## 2019-07-19 ENCOUNTER — Encounter: Payer: Self-pay | Admitting: Surgery

## 2019-07-19 VITALS — BP 140/56 | HR 55 | Temp 95.2°F | Resp 12 | Ht 63.0 in | Wt 206.0 lb

## 2019-07-19 DIAGNOSIS — Z170 Estrogen receptor positive status [ER+]: Secondary | ICD-10-CM

## 2019-07-19 DIAGNOSIS — C50412 Malignant neoplasm of upper-outer quadrant of left female breast: Secondary | ICD-10-CM | POA: Diagnosis not present

## 2019-07-19 NOTE — Patient Instructions (Addendum)
Dr.Pabon recommends patient to have a follow up mammogram and office visit in one year.   Breast Self-Awareness Breast self-awareness means being familiar with how your breasts look and feel. It involves checking your breasts regularly and reporting any changes to your health care provider. Practicing breast self-awareness is important. Sometimes changes may not be harmful (are benign), but sometimes a change in your breasts can be a sign of a serious medical problem. It is important to learn how to do this procedure correctly so that you can catch problems early, when treatment is more likely to be successful. All women should practice breast self-awareness, including women who have had breast implants. What you need:  A mirror.  A well-lit room. How to do a breast self-exam A breast self-exam is one way to learn what is normal for your breasts and whether your breasts are changing. To do a breast self-exam: Look for changes  1. Remove all the clothing above your waist. 2. Stand in front of a mirror in a room with good lighting. 3. Put your hands on your hips. 4. Push your hands firmly downward. 5. Compare your breasts in the mirror. Look for differences between them (asymmetry), such as: ? Differences in shape. ? Differences in size. ? Puckers, dips, and bumps in one breast and not the other. 6. Look at each breast for changes in the skin, such as: ? Redness. ? Scaly areas. 7. Look for changes in your nipples, such as: ? Discharge. ? Bleeding. ? Dimpling. ? Redness. ? A change in position. Feel for changes Carefully feel your breasts for lumps and changes. It is best to do this while lying on your back on the floor, and again while sitting or standing in the tub or shower with soapy water on your skin. Feel each breast in the following way: 1. Place the arm on the side of the breast you are examining above your head. 2. Feel your breast with the other hand. 3. Start in the nipple  area and make -inch (2 cm) overlapping circles to feel your breast. Use the pads of your three middle fingers to do this. Apply light pressure, then medium pressure, then firm pressure. The light pressure will allow you to feel the tissue closest to the skin. The medium pressure will allow you to feel the tissue that is a little deeper. The firm pressure will allow you to feel the tissue close to the ribs. 4. Continue the overlapping circles, moving downward over the breast until you feel your ribs below your breast. 5. Move one finger-width toward the center of the body. Continue to use the -inch (2 cm) overlapping circles to feel your breast as you move slowly up toward your collarbone. 6. Continue the up-and-down exam using all three pressures until you reach your armpit.  Write down what you find Writing down what you find can help you remember what to discuss with your health care provider. Write down:  What is normal for each breast.  Any changes that you find in each breast, including: ? The kind of changes you find. ? Any pain or tenderness. ? Size and location of any lumps.  Where you are in your menstrual cycle, if you are still menstruating. General tips and recommendations  Examine your breasts every month.  If you are breastfeeding, the best time to examine your breasts is after a feeding or after using a breast pump.  If you menstruate, the best time to examine your  breasts is 5-7 days after your period. Breasts are generally lumpier during menstrual periods, and it may be more difficult to notice changes.  With time and practice, you will become more familiar with the variations in your breasts and more comfortable with the exam. Contact a health care provider if you:  See a change in the shape or size of your breasts or nipples.  See a change in the skin of your breast or nipples, such as a reddened or scaly area.  Have unusual discharge from your nipples.  Find a  lump or thick area that was not there before.  Have pain in your breasts.  Have any concerns related to your breast health. Summary  Breast self-awareness includes looking for physical changes in your breasts, as well as feeling for any changes within your breasts.  Breast self-awareness should be performed in front of a mirror in a well-lit room.  You should examine your breasts every month. If you menstruate, the best time to examine your breasts is 5-7 days after your menstrual period.  Let your health care provider know of any changes you notice in your breasts, including changes in size, changes on the skin, pain or tenderness, or unusual fluid from your nipples. This information is not intended to replace advice given to you by your health care provider. Make sure you discuss any questions you have with your health care provider. Document Revised: 12/21/2017 Document Reviewed: 12/21/2017 Elsevier Patient Education  [place].

## 2019-07-21 ENCOUNTER — Encounter: Payer: Self-pay | Admitting: Surgery
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
## 2019-07-26 ENCOUNTER — Inpatient Hospital Stay: Payer: Medicare Other

## 2019-07-26 ENCOUNTER — Other Ambulatory Visit: Payer: Self-pay

## 2019-07-26 VITALS — BP 144/69 | HR 59 | Temp 96.5°F | Resp 18

## 2019-07-26 DIAGNOSIS — C50412 Malignant neoplasm of upper-outer quadrant of left female breast: Secondary | ICD-10-CM | POA: Diagnosis not present

## 2019-07-26 DIAGNOSIS — D508 Other iron deficiency anemias: Secondary | ICD-10-CM

## 2019-07-26 DIAGNOSIS — N189 Chronic kidney disease, unspecified: Secondary | ICD-10-CM | POA: Diagnosis not present

## 2019-07-26 DIAGNOSIS — Z170 Estrogen receptor positive status [ER+]: Secondary | ICD-10-CM | POA: Diagnosis not present

## 2019-07-26 DIAGNOSIS — D519 Vitamin B12 deficiency anemia, unspecified: Secondary | ICD-10-CM | POA: Diagnosis not present

## 2019-07-26 DIAGNOSIS — I129 Hypertensive chronic kidney disease with stage 1 through stage 4 chronic kidney disease, or unspecified chronic kidney disease: Secondary | ICD-10-CM | POA: Diagnosis not present

## 2019-07-26 MED ORDER — IRON SUCROSE 20 MG/ML IV SOLN
200.0000 mg | Freq: Once | INTRAVENOUS | Status: AC
  Administered 2019-07-26: 200 mg via INTRAVENOUS
  Filled 2019-07-26: qty 10

## 2019-07-26 MED ORDER — CYANOCOBALAMIN 1000 MCG/ML IJ SOLN: 1000.0000 ug | Freq: Once | INTRAMUSCULAR | Status: DC

## 2019-07-26 MED ORDER — SODIUM CHLORIDE 0.9 % IV SOLN
INTRAVENOUS | Status: DC
  Filled 2019-07-26: qty 250

## 2019-08-10 ENCOUNTER — Other Ambulatory Visit: Payer: Self-pay | Admitting: Family Medicine

## 2019-11-02 DIAGNOSIS — Z23 Encounter for immunization: Secondary | ICD-10-CM | POA: Diagnosis not present

## 2019-11-22 ENCOUNTER — Telehealth: Payer: Self-pay | Admitting: *Deleted

## 2019-11-22 NOTE — Telephone Encounter (Signed)
PA done for pt's Bentyl at [email or web address], I will await a response

## 2019-11-24 NOTE — Telephone Encounter (Signed)
PA was approved. Pharmacy  Notified and letter placed in Dr. Marliss Coots inbox to sign and send for scanning

## 2020-01-17 ENCOUNTER — Inpatient Hospital Stay: Payer: Medicare Other | Attending: Oncology

## 2020-01-17 ENCOUNTER — Encounter: Payer: Self-pay | Admitting: Oncology

## 2020-01-17 ENCOUNTER — Other Ambulatory Visit: Payer: Self-pay

## 2020-01-17 ENCOUNTER — Ambulatory Visit
Admission: RE | Admit: 2020-01-17 | Discharge: 2020-01-17 | Disposition: A | Discharge status: Home / Self Care | Payer: Medicare Other | Source: Ambulatory Visit | Attending: Oncology | Admitting: Oncology

## 2020-01-17 ENCOUNTER — Inpatient Hospital Stay (HOSPITAL_BASED_OUTPATIENT_CLINIC_OR_DEPARTMENT_OTHER): Payer: Medicare Other | Admitting: Oncology

## 2020-01-17 VITALS — BP 148/70 | HR 55 | Temp 97.5°F | Resp 18 | Wt 206.7 lb

## 2020-01-17 DIAGNOSIS — R0602 Shortness of breath: Secondary | ICD-10-CM | POA: Insufficient documentation

## 2020-01-17 DIAGNOSIS — Z8249 Family history of ischemic heart disease and other diseases of the circulatory system: Secondary | ICD-10-CM | POA: Insufficient documentation

## 2020-01-17 DIAGNOSIS — Z79899 Other long term (current) drug therapy: Secondary | ICD-10-CM | POA: Diagnosis not present

## 2020-01-17 DIAGNOSIS — Z841 Family history of disorders of kidney and ureter: Secondary | ICD-10-CM | POA: Insufficient documentation

## 2020-01-17 DIAGNOSIS — G4730 Sleep apnea, unspecified: Secondary | ICD-10-CM | POA: Diagnosis not present

## 2020-01-17 DIAGNOSIS — C50412 Malignant neoplasm of upper-outer quadrant of left female breast: Secondary | ICD-10-CM

## 2020-01-17 DIAGNOSIS — Z833 Family history of diabetes mellitus: Secondary | ICD-10-CM | POA: Diagnosis not present

## 2020-01-17 DIAGNOSIS — Z8049 Family history of malignant neoplasm of other genital organs: Secondary | ICD-10-CM | POA: Insufficient documentation

## 2020-01-17 DIAGNOSIS — I10 Essential (primary) hypertension: Secondary | ICD-10-CM | POA: Diagnosis not present

## 2020-01-17 DIAGNOSIS — R05 Cough: Secondary | ICD-10-CM | POA: Diagnosis not present

## 2020-01-17 DIAGNOSIS — Z170 Estrogen receptor positive status [ER+]: Secondary | ICD-10-CM

## 2020-01-17 DIAGNOSIS — F329 Major depressive disorder, single episode, unspecified: Secondary | ICD-10-CM | POA: Diagnosis not present

## 2020-01-17 DIAGNOSIS — R059 Cough, unspecified: Secondary | ICD-10-CM

## 2020-01-17 DIAGNOSIS — E785 Hyperlipidemia, unspecified: Secondary | ICD-10-CM | POA: Insufficient documentation

## 2020-01-17 DIAGNOSIS — R5383 Other fatigue: Secondary | ICD-10-CM | POA: Insufficient documentation

## 2020-01-17 DIAGNOSIS — Z836 Family history of other diseases of the respiratory system: Secondary | ICD-10-CM | POA: Diagnosis not present

## 2020-01-17 DIAGNOSIS — M7989 Other specified soft tissue disorders: Secondary | ICD-10-CM | POA: Diagnosis not present

## 2020-01-17 DIAGNOSIS — D518 Other vitamin B12 deficiency anemias: Secondary | ICD-10-CM

## 2020-01-17 LAB — CBC WITH DIFFERENTIAL/PLATELET
Abs Immature Granulocytes: 0.05 10*3/uL (ref 0.00–0.07)
Basophils Absolute: 0 10*3/uL (ref 0.0–0.1)
Basophils Relative: 0 %
Eosinophils Absolute: 0.5 10*3/uL (ref 0.0–0.5)
Eosinophils Relative: 5 %
HCT: 33.4 % — ABNORMAL LOW (ref 36.0–46.0)
Hemoglobin: 11.3 g/dL — ABNORMAL LOW (ref 12.0–15.0)
Immature Granulocytes: 1 %
Lymphocytes Relative: 19 %
Lymphs Abs: 1.9 10*3/uL (ref 0.7–4.0)
MCH: 30 pg (ref 26.0–34.0)
MCHC: 33.8 g/dL (ref 30.0–36.0)
MCV: 88.6 fL (ref 80.0–100.0)
Monocytes Absolute: 0.6 10*3/uL (ref 0.1–1.0)
Monocytes Relative: 6 %
Neutro Abs: 7 10*3/uL (ref 1.7–7.7)
Neutrophils Relative %: 69 %
Platelets: 328 10*3/uL (ref 150–400)
RBC: 3.77 MIL/uL — ABNORMAL LOW (ref 3.87–5.11)
RDW: 12.9 % (ref 11.5–15.5)
WBC: 10.1 10*3/uL (ref 4.0–10.5)
nRBC: 0 % (ref 0.0–0.2)

## 2020-01-17 LAB — COMPREHENSIVE METABOLIC PANEL
ALT: 10 U/L (ref 0–44)
AST: 13 U/L — ABNORMAL LOW (ref 15–41)
Albumin: 3.7 g/dL (ref 3.5–5.0)
Alkaline Phosphatase: 44 U/L (ref 38–126)
Anion gap: 10 (ref 5–15)
BUN: 29 mg/dL — ABNORMAL HIGH (ref 8–23)
CO2: 26 mmol/L (ref 22–32)
Calcium: 8.6 mg/dL — ABNORMAL LOW (ref 8.9–10.3)
Chloride: 102 mmol/L (ref 98–111)
Creatinine, Ser: 1.3 mg/dL — ABNORMAL HIGH (ref 0.44–1.00)
GFR calc Af Amer: 46 mL/min — ABNORMAL LOW (ref >60)
GFR calc non Af Amer: 40 mL/min — ABNORMAL LOW (ref >60)
Glucose, Bld: 146 mg/dL — ABNORMAL HIGH (ref 70–99)
Potassium: 4.3 mmol/L (ref 3.5–5.1)
Sodium: 138 mmol/L (ref 135–145)
Total Bilirubin: 0.2 mg/dL — ABNORMAL LOW (ref 0.3–1.2)
Total Protein: 6.8 g/dL (ref 6.5–8.1)

## 2020-01-17 LAB — FERRITIN: Ferritin: 85 ng/mL (ref 11–307)

## 2020-01-17 LAB — VITAMIN B12: Vitamin B-12: 947 pg/mL — ABNORMAL HIGH (ref 180–914)

## 2020-01-17 LAB — IRON AND TIBC
Iron: 53 ug/dL (ref 28–170)
Saturation Ratios: 18 % (ref 10.4–31.8)
TIBC: 302 ug/dL (ref 250–450)
UIBC: 249 ug/dL

## 2020-01-17 NOTE — Progress Notes (Signed)
Patient is feeling weak/fatigued and feels she would benefit from an iron infusion.
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
## 2020-01-18 ENCOUNTER — Encounter: Payer: Self-pay | Admitting: Oncology

## 2020-01-23 NOTE — Telephone Encounter (Signed)
Done... I was uable to reach pts daughter [name]  But I did reach out to Ms Kenealy and made her aware of her sched 02/01/20 IV Venofer 200 mg x 1  @ 130. She stated that she would let her daughter know as well.

## 2020-02-01 ENCOUNTER — Other Ambulatory Visit: Payer: Self-pay

## 2020-02-01 ENCOUNTER — Inpatient Hospital Stay: Payer: Medicare Other

## 2020-02-01 VITALS — BP 137/69 | HR 56 | Temp 99.0°F | Resp 16

## 2020-02-01 DIAGNOSIS — Z170 Estrogen receptor positive status [ER+]: Secondary | ICD-10-CM | POA: Diagnosis not present

## 2020-02-01 DIAGNOSIS — R5383 Other fatigue: Secondary | ICD-10-CM | POA: Diagnosis not present

## 2020-02-01 DIAGNOSIS — G4730 Sleep apnea, unspecified: Secondary | ICD-10-CM | POA: Diagnosis not present

## 2020-02-01 DIAGNOSIS — C50412 Malignant neoplasm of upper-outer quadrant of left female breast: Secondary | ICD-10-CM | POA: Diagnosis not present

## 2020-02-01 DIAGNOSIS — R0602 Shortness of breath: Secondary | ICD-10-CM | POA: Diagnosis not present

## 2020-02-01 DIAGNOSIS — F329 Major depressive disorder, single episode, unspecified: Secondary | ICD-10-CM | POA: Diagnosis not present

## 2020-02-01 DIAGNOSIS — D508 Other iron deficiency anemias: Secondary | ICD-10-CM

## 2020-02-01 MED ORDER — IRON SUCROSE 20 MG/ML IV SOLN
200.0000 mg | Freq: Once | INTRAVENOUS | Status: AC
  Administered 2020-02-01: 200 mg via INTRAVENOUS
  Filled 2020-02-01: qty 10

## 2020-02-22 ENCOUNTER — Other Ambulatory Visit: Payer: Self-pay | Admitting: Family Medicine

## 2020-02-22 NOTE — Telephone Encounter (Signed)
Pt hasn't been seen in almost a year and no future appts., please advise  

## 2020-02-22 NOTE — Telephone Encounter (Signed)
Please schedule f/u or PE and refill until then 

## 2020-02-23 NOTE — Telephone Encounter (Signed)
Med refilled once and Morey Hummingbird will reach out to to pt to try and get appt scheduled

## 2020-02-27 ENCOUNTER — Ambulatory Visit (INDEPENDENT_AMBULATORY_CARE_PROVIDER_SITE_OTHER): Payer: Medicare Other | Admitting: Family Medicine

## 2020-02-27 ENCOUNTER — Encounter: Payer: Self-pay | Admitting: Family Medicine

## 2020-02-27 ENCOUNTER — Other Ambulatory Visit: Payer: Self-pay

## 2020-02-27 VITALS — BP 132/60 | HR 49 | Temp 97.6°F | Ht 63.0 in | Wt 207.0 lb

## 2020-02-27 DIAGNOSIS — Z6836 Body mass index (BMI) 36.0-36.9, adult: Secondary | ICD-10-CM | POA: Diagnosis not present

## 2020-02-27 DIAGNOSIS — D508 Other iron deficiency anemias: Secondary | ICD-10-CM | POA: Diagnosis not present

## 2020-02-27 DIAGNOSIS — E7800 Pure hypercholesterolemia, unspecified: Secondary | ICD-10-CM | POA: Diagnosis not present

## 2020-02-27 DIAGNOSIS — I10 Essential (primary) hypertension: Secondary | ICD-10-CM | POA: Diagnosis not present

## 2020-02-27 DIAGNOSIS — Z23 Encounter for immunization: Secondary | ICD-10-CM

## 2020-02-27 DIAGNOSIS — R1310 Dysphagia, unspecified: Secondary | ICD-10-CM | POA: Insufficient documentation

## 2020-02-27 DIAGNOSIS — R053 Chronic cough: Secondary | ICD-10-CM | POA: Diagnosis not present

## 2020-02-27 DIAGNOSIS — G43109 Migraine with aura, not intractable, without status migrainosus: Secondary | ICD-10-CM | POA: Diagnosis not present

## 2020-02-27 DIAGNOSIS — D518 Other vitamin B12 deficiency anemias: Secondary | ICD-10-CM | POA: Diagnosis not present

## 2020-02-27 DIAGNOSIS — R7303 Prediabetes: Secondary | ICD-10-CM | POA: Diagnosis not present

## 2020-02-27 DIAGNOSIS — N289 Disorder of kidney and ureter, unspecified: Secondary | ICD-10-CM

## 2020-02-27 LAB — HEMOGLOBIN A1C: Hgb A1c MFr Bld: 6.1 % (ref 4.6–6.5)

## 2020-02-27 LAB — LIPID PANEL
Cholesterol: 144 mg/dL (ref 0–200)
HDL: 37.1 mg/dL — ABNORMAL LOW (ref >39.00)
NonHDL: 106.43
Total CHOL/HDL Ratio: 4
Triglycerides: 272 mg/dL — ABNORMAL HIGH (ref 0.0–149.0)
VLDL: 54.4 mg/dL — ABNORMAL HIGH (ref 0.0–40.0)

## 2020-02-27 LAB — LDL CHOLESTEROL, DIRECT: Direct LDL: 66 mg/dL

## 2020-02-27 LAB — TSH: TSH: 1.78 u[IU]/mL (ref 0.35–4.50)

## 2020-02-27 MED ORDER — BENAZEPRIL HCL 20 MG PO TABS
20.0000 mg | ORAL_TABLET | Freq: Every day | ORAL | 3 refills | Status: DC
Start: 2020-02-27 — End: 2021-03-21

## 2020-02-27 MED ORDER — FUROSEMIDE 20 MG PO TABS
20.0000 mg | ORAL_TABLET | Freq: Every day | ORAL | 3 refills | Status: DC
Start: 2020-02-27 — End: 2021-03-21

## 2020-02-27 MED ORDER — CITALOPRAM HYDROBROMIDE 40 MG PO TABS
40.0000 mg | ORAL_TABLET | Freq: Every day | ORAL | 3 refills | Status: DC
Start: 2020-02-27 — End: 2021-03-21

## 2020-02-27 MED ORDER — ALBUTEROL SULFATE HFA 108 (90 BASE) MCG/ACT IN AERS: 2.0000 | INHALATION_SPRAY | RESPIRATORY_TRACT | 3 refills | Status: AC | PRN

## 2020-02-27 MED ORDER — OMEPRAZOLE 20 MG PO CPDR
20.0000 mg | DELAYED_RELEASE_CAPSULE | Freq: Every day | ORAL | 3 refills | Status: DC
Start: 2020-02-27 — End: 2021-03-21

## 2020-02-27 MED ORDER — ROSUVASTATIN CALCIUM 10 MG PO TABS
5.0000 mg | ORAL_TABLET | Freq: Every day | ORAL | 3 refills | Status: DC
Start: 2020-02-27 — End: 2021-03-17

## 2020-02-27 MED ORDER — DICYCLOMINE HCL 20 MG PO TABS
20.0000 mg | ORAL_TABLET | Freq: Two times a day (BID) | ORAL | 3 refills | Status: DC
Start: 2020-02-27 — End: 2021-03-04

## 2020-02-27 MED ORDER — AMLODIPINE BESYLATE 10 MG PO TABS
10.0000 mg | ORAL_TABLET | Freq: Every day | ORAL | 3 refills | Status: DC
Start: 2020-02-27 — End: 2021-03-21
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
## 2020-02-27 NOTE — Assessment & Plan Note (Signed)
Seen by hematology Lab Results  Component Value Date   VITAMINB12 947 (H) 01/17/2020   Taking 1000 mcg daily

## 2020-02-27 NOTE — Assessment & Plan Note (Signed)
Due for A1C Eating some ice cream disc imp of low glycemic diet and wt loss to prevent DM2

## 2020-02-27 NOTE — Assessment & Plan Note (Signed)
Holding atenolol due to low pulse  She will report back if migraine worsens

## 2020-02-27 NOTE — Assessment & Plan Note (Signed)
Discussed how this problem influences overall health and the risks it imposes  Reviewed plan for weight loss with lower calorie diet (via better food choices and also portion control or program like weight watchers) and exercise building up to or more than 30 minutes 5 days per week including some aerobic activity    

## 2020-02-27 NOTE — Patient Instructions (Addendum)
Hold your atenolol due to low pulse  I want to watch your blood pressure and migraines  If your migraines get worse let me know   Aim for 64 oz of fluids daily (mostly water)  This is for kidney health   Please increase activity / exercise   Use your inhaler for cough or wheeze and we will re visit at your follow up in 2-4 weeks

## 2020-02-27 NOTE — Assessment & Plan Note (Signed)
Taking crestor Disc goals for lipids and reasons to control them Rev last labs with pt Rev low sat fat diet in detail Will check labs today  Diet is fair

## 2020-02-27 NOTE — Assessment & Plan Note (Addendum)
Last cr stable at 1.3  Enc strongly to inc fluids to 60 oz daily (mostly water)  She takes ace inhibitor

## 2020-02-27 NOTE — Assessment & Plan Note (Signed)
bp in fair control at this time  BP Readings from Last 1 Encounters:  02/27/20 132/60   With low pulse of 49, will try holding the atenolol and continue the amlodipine 10 mg daily and benazepril 20 mg daily  Will discuss benazepril further at next appt in light of chronic cough if she still has it  Most recent labs reviewed  Disc lifstyle change with low sodium diet and exercise

## 2020-02-27 NOTE — Assessment & Plan Note (Signed)
Continues hematology care with IV iron infusions when needed

## 2020-02-27 NOTE — Assessment & Plan Note (Signed)
occ pills get stuck in throat at beginning of swallow Mouth is dry- asked to first inc fluids to 60 oz daily  Will address further at f/u  ? If GERD issue

## 2020-03-19 ENCOUNTER — Other Ambulatory Visit: Payer: Self-pay

## 2020-03-19 ENCOUNTER — Encounter: Payer: Self-pay | Admitting: Family Medicine

## 2020-03-19 ENCOUNTER — Ambulatory Visit (INDEPENDENT_AMBULATORY_CARE_PROVIDER_SITE_OTHER): Payer: Medicare Other | Admitting: Family Medicine

## 2020-03-19 VITALS — BP 134/66 | HR 68 | Temp 96.5°F | Ht 63.0 in | Wt 204.8 lb

## 2020-03-19 DIAGNOSIS — I10 Essential (primary) hypertension: Secondary | ICD-10-CM | POA: Diagnosis not present

## 2020-03-19 DIAGNOSIS — R7303 Prediabetes: Secondary | ICD-10-CM | POA: Diagnosis not present

## 2020-03-19 DIAGNOSIS — R053 Chronic cough: Secondary | ICD-10-CM

## 2020-03-19 DIAGNOSIS — E7800 Pure hypercholesterolemia, unspecified: Secondary | ICD-10-CM | POA: Diagnosis not present

## 2020-03-19 NOTE — Assessment & Plan Note (Signed)
Well controlled LDL (66) with crestor 10 mg daily  Disc goals for lipids and reasons to control them Rev last labs with pt Rev low sat fat diet in detail

## 2020-03-19 NOTE — Patient Instructions (Addendum)
Blood pressure is looking ok on current medicines   No changes for now  Your blood pressure cuff may not be accurate   If your cough worsens we may need to change the benazapril so let me know   Glad you are doing better

## 2020-03-19 NOTE — Assessment & Plan Note (Signed)
bp is stable off the atenolol  bp in fair control at this time  BP Readings from Last 1 Encounters:  03/19/20 134/66   No changes needed Plan to continue amlodipine 10 mg daily and benazepril 20 mg daily  If cough returns would consider changing the ace to an ARB Most recent labs reviewed  Disc lifstyle change with low sodium diet and exercise  F/u 6 mo  Sounds like home cuff is not accurate-will stop checking or get a new one

## 2020-03-19 NOTE — Assessment & Plan Note (Signed)
This improved significantly off of atenolol  She still takes ace inhibitor and would consider change to ARB if cough returns
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
## 2020-03-19 NOTE — Assessment & Plan Note (Signed)
Lab Results  Component Value Date   HGBA1C 6.1 02/27/2020   Eating has improved disc imp of low glycemic diet and wt loss to prevent DM2

## 2020-03-27 ENCOUNTER — Other Ambulatory Visit: Payer: Self-pay | Admitting: Family Medicine

## 2020-04-29 ENCOUNTER — Encounter: Payer: Self-pay | Admitting: Radiation Oncology

## 2020-04-29 ENCOUNTER — Ambulatory Visit
Admission: RE | Admit: 2020-04-29 | Discharge: 2020-04-29 | Disposition: A | Discharge status: Home / Self Care | Payer: Medicare Other | Source: Ambulatory Visit | Attending: Radiation Oncology | Admitting: Radiation Oncology

## 2020-04-29 VITALS — BP 152/73 | HR 66 | Temp 97.4°F | Wt 205.1 lb

## 2020-04-29 DIAGNOSIS — C50412 Malignant neoplasm of upper-outer quadrant of left female breast: Secondary | ICD-10-CM

## 2020-04-29 DIAGNOSIS — Z853 Personal history of malignant neoplasm of breast: Secondary | ICD-10-CM | POA: Diagnosis not present

## 2020-04-29 NOTE — Progress Notes (Signed)
Radiation Oncology Follow up Note  Name: Ann Cummings   Date:   04/29/2020 MRN:  [id number] DOB: 1941/05/14    This 79 y.o. female presents to the clinic today for 2-1/2-year follow-up status post accelerated partial breast radiation to her left breast for stage I ER/PR positive invasive mammary carcinoma.  REFERRING PROVIDER: Tower, [name], MD  HPI: Patient is a 79 year old female now out 2 and half years having completed accelerated partial breast radiation to her left breast for stage I ER/PR positive invasive mammary carcinoma.  Seen today in routine follow-up she is doing well.  She specifically denies breast tenderness cough or bone pain.  She mammograms back in February which I have reviewed were BI-RADS 2 benign.  Patient has declined adjuvant aromatase inhibitor.  COMPLICATIONS OF TREATMENT: none  FOLLOW UP COMPLIANCE: keeps appointments   PHYSICAL EXAM:  BP (!) 152/73   Pulse 66   Temp (!) 97.4 F (36.3 C) (Tympanic)   Wt 205 lb 1.6 oz (93 kg)   BMI 36.33 kg/m  Lungs are clear to A&P cardiac examination essentially unremarkable with regular rate and rhythm. No dominant mass or nodularity is noted in either breast in 2 positions examined. Incision is well-healed. No axillary or supraclavicular adenopathy is appreciated. Cosmetic result is excellent.  Well-developed well-nourished patient in NAD. HEENT reveals PERLA, EOMI, discs not visualized.  Oral cavity is clear. No oral mucosal lesions are identified. Neck is clear without evidence of cervical or supraclavicular adenopathy. Lungs are clear to A&P. Cardiac examination is essentially unremarkable with regular rate and rhythm without murmur rub or thrill. Abdomen is benign with no organomegaly or masses noted. Motor sensory and DTR levels are equal and symmetric in the upper and lower extremities. Cranial nerves II through XII are grossly intact. Proprioception is intact. No peripheral adenopathy or edema is identified. No motor  or sensory levels are noted. Crude visual fields are within normal range.  RADIOLOGY RESULTS: Mammograms reviewed compatible with above-stated findings  PLAN: Present time patient is doing well with no evidence of disease at 2-1/2 years.  I am pleased with her overall progress.  She is already scheduled for follow-up mammograms in February.  Again she has declined aromatase inhibitors.  I have asked to see her back in 1 year for follow-up.  Patient knows to call with any concerns.  I would like to take this opportunity to thank you for allowing me to participate in the care of your patient.Noreene Filbert, MD

## 2020-05-01 ENCOUNTER — Other Ambulatory Visit: Payer: Self-pay | Admitting: Family Medicine

## 2020-05-07 DIAGNOSIS — H59811 Chorioretinal scars after surgery for detachment, right eye: Secondary | ICD-10-CM | POA: Diagnosis not present

## 2020-05-07 DIAGNOSIS — H2513 Age-related nuclear cataract, bilateral: Secondary | ICD-10-CM | POA: Diagnosis not present

## 2020-05-07 DIAGNOSIS — E113293 Type 2 diabetes mellitus with mild nonproliferative diabetic retinopathy without macular edema, bilateral: Secondary | ICD-10-CM | POA: Diagnosis not present

## 2020-05-07 DIAGNOSIS — H524 Presbyopia: Secondary | ICD-10-CM | POA: Diagnosis not present

## 2020-05-07 DIAGNOSIS — H5203 Hypermetropia, bilateral: Secondary | ICD-10-CM | POA: Diagnosis not present

## 2020-05-07 DIAGNOSIS — D3132 Benign neoplasm of left choroid: Secondary | ICD-10-CM | POA: Diagnosis not present

## 2020-05-07 LAB — HM DIABETES EYE EXAM

## 2020-06-06 DIAGNOSIS — H348322 Tributary (branch) retinal vein occlusion, left eye, stable: Secondary | ICD-10-CM | POA: Diagnosis not present

## 2020-06-06 DIAGNOSIS — H353211 Exudative age-related macular degeneration, right eye, with active choroidal neovascularization: Secondary | ICD-10-CM | POA: Diagnosis not present

## 2020-06-10 DIAGNOSIS — D3132 Benign neoplasm of left choroid: Secondary | ICD-10-CM | POA: Diagnosis not present

## 2020-06-10 DIAGNOSIS — H353211 Exudative age-related macular degeneration, right eye, with active choroidal neovascularization: Secondary | ICD-10-CM | POA: Diagnosis not present

## 2020-06-10 DIAGNOSIS — H353122 Nonexudative age-related macular degeneration, left eye, intermediate dry stage: Secondary | ICD-10-CM | POA: Diagnosis not present

## 2020-06-10 DIAGNOSIS — H35372 Puckering of macula, left eye: Secondary | ICD-10-CM | POA: Diagnosis not present

## 2020-06-19 ENCOUNTER — Encounter: Payer: Self-pay | Admitting: Family Medicine

## 2020-06-24 ENCOUNTER — Other Ambulatory Visit: Payer: Self-pay | Admitting: Family Medicine

## 2020-06-25 NOTE — Telephone Encounter (Signed)
F/u scheduled on 09/16/20, last filled on 02/28/19 #90 tabs with 1 refills

## 2020-06-28 ENCOUNTER — Telehealth: Payer: Self-pay | Admitting: *Deleted

## 2020-06-28 NOTE — Telephone Encounter (Signed)
PA done at [email or web address] for pt's flexeril. I will await a response

## 2020-07-15 ENCOUNTER — Telehealth: Payer: Self-pay | Admitting: *Deleted

## 2020-07-15 ENCOUNTER — Other Ambulatory Visit: Payer: Medicare Other

## 2020-07-15 DIAGNOSIS — H353122 Nonexudative age-related macular degeneration, left eye, intermediate dry stage: Secondary | ICD-10-CM | POA: Diagnosis not present

## 2020-07-15 DIAGNOSIS — H34832 Tributary (branch) retinal vein occlusion, left eye, with macular edema: Secondary | ICD-10-CM | POA: Diagnosis not present

## 2020-07-15 DIAGNOSIS — H353211 Exudative age-related macular degeneration, right eye, with active choroidal neovascularization: Secondary | ICD-10-CM | POA: Diagnosis not present

## 2020-07-15 NOTE — Telephone Encounter (Signed)
Left VM letting daughter know letter ready for pick up

## 2020-07-15 NOTE — Telephone Encounter (Signed)
Patient's daughter [name] called stating that her mom got a jury summons in the mail. [name] wants to know if Dr. Glori Bickers will write her a letter to have her excused from jury duty. [name] stated that Dr. Glori Bickers has done this for her before.

## 2020-07-15 NOTE — Telephone Encounter (Signed)
Patient has memory and balance issues as well as macular degeneration. She has started treatment for today.

## 2020-07-15 NOTE — Telephone Encounter (Signed)
Printed and in IN box  

## 2020-07-15 NOTE — Telephone Encounter (Signed)
Left VM requesting pt's daughter to call the office back

## 2020-07-15 NOTE — Telephone Encounter (Signed)
What is her latest barrier to serving (caused by health problems) ?  I will need to include this  Thanks

## 2020-07-17 ENCOUNTER — Other Ambulatory Visit: Payer: Medicare Other

## 2020-07-17 ENCOUNTER — Ambulatory Visit: Payer: Medicare Other | Admitting: Oncology

## 2020-07-24 ENCOUNTER — Ambulatory Visit
Admission: RE | Admit: 2020-07-24 | Discharge: 2020-07-24 | Disposition: A | Discharge status: Home / Self Care | Payer: Medicare Other | Source: Ambulatory Visit | Attending: Oncology | Admitting: Oncology

## 2020-07-24 ENCOUNTER — Other Ambulatory Visit: Payer: Self-pay

## 2020-07-24 DIAGNOSIS — Z170 Estrogen receptor positive status [ER+]: Secondary | ICD-10-CM | POA: Insufficient documentation

## 2020-07-24 DIAGNOSIS — C50412 Malignant neoplasm of upper-outer quadrant of left female breast: Secondary | ICD-10-CM | POA: Insufficient documentation

## 2020-07-24 DIAGNOSIS — N6489 Other specified disorders of breast: Secondary | ICD-10-CM | POA: Diagnosis not present

## 2020-07-24 DIAGNOSIS — R928 Other abnormal and inconclusive findings on diagnostic imaging of breast: Secondary | ICD-10-CM | POA: Diagnosis not present

## 2020-07-29 ENCOUNTER — Other Ambulatory Visit: Payer: Self-pay

## 2020-07-29 ENCOUNTER — Encounter: Payer: Self-pay | Admitting: Surgery

## 2020-07-29 ENCOUNTER — Ambulatory Visit (INDEPENDENT_AMBULATORY_CARE_PROVIDER_SITE_OTHER): Payer: Medicare Other | Admitting: Surgery

## 2020-07-29 VITALS — BP 172/68 | HR 84 | Temp 99.6°F | Ht 63.0 in | Wt 202.0 lb

## 2020-07-29 DIAGNOSIS — Z170 Estrogen receptor positive status [ER+]: Secondary | ICD-10-CM | POA: Diagnosis not present

## 2020-07-29 DIAGNOSIS — C50412 Malignant neoplasm of upper-outer quadrant of left female breast: Secondary | ICD-10-CM | POA: Diagnosis not present
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
## 2020-07-29 NOTE — Patient Instructions (Addendum)
We will contact you in February 2023 to schedule your mammogram and follow up appointment with Dr.Pabon. If you do not hear from our office by the end of February please call so we can get this scheduled.   If you prefer your Primary care physician to order your mammogram and breast exam that will be fine.  Breast Self-Awareness Breast self-awareness is knowing how your breasts look and feel. Doing breast self-awareness is important. It allows you to catch a breast problem early while it is still small and can be treated. All women should do breast self-awareness, including women who have had breast implants. Tell your doctor if you notice a change in your breasts. What you need:  A mirror.  A well-lit room. How to do a breast self-exam A breast self-exam is one way to learn what is normal for your breasts and to check for changes. To do a breast self-exam: Look for changes 1. Take off all the clothes above your waist. 2. Stand in front of a mirror in a room with good lighting. 3. Put your hands on your hips. 4. Push your hands down. 5. Look at your breasts and nipples in the mirror to see if one breast or nipple looks different from the other. Check to see if: ? The shape of one breast is different. ? The size of one breast is different. ? There are wrinkles, dips, and bumps in one breast and not the other. 6. Look at each breast for changes in the skin, such as: ? Redness. ? Scaly areas. 7. Look for changes in your nipples, such as: ? Liquid around the nipples. ? Bleeding. ? Dimpling. ? Redness. ? A change in where the nipples are.   Feel for changes 1. Lie on your back on the floor. 2. Feel each breast. To do this, follow these steps: ? Pick a breast to feel. ? Put the arm closest to that breast above your head. ? Use your other arm to feel the nipple area of your breast. Feel the area with the pads of your three middle fingers by making small circles with your fingers. For the  first circle, press lightly. For the second circle, press harder. For the third circle, press even harder. ? Keep making circles with your fingers at the different pressures as you move down your breast. Stop when you feel your ribs. ? Move your fingers a little toward the center of your body. ? Start making circles with your fingers again, this time going up until you reach your collarbone. ? Keep making up-and-down circles until you reach your armpit. Remember to keep using the three pressures. ? Feel the other breast in the same way. 3. Sit or stand in the tub or shower. 4. With soapy water on your skin, feel each breast the same way you did in step 2 when you were lying on the floor.   Write down what you find Writing down what you find can help you remember what to tell your doctor. Write down:  What is normal for each breast.  Any changes you find in each breast, including: ? The kind of changes you find. ? Whether you have pain. ? Size and location of any lumps.  When you last had your menstrual period. General tips  Check your breasts every month.  If you are breastfeeding, the best time to check your breasts is after you feed your baby or after you use a breast pump.  If  you get menstrual periods, the best time to check your breasts is 5-7 days after your menstrual period is over.  With time, you will become comfortable with the self-exam, and you will begin to know if there are changes in your breasts. Contact a doctor if you:  See a change in the shape or size of your breasts or nipples.  See a change in the skin of your breast or nipples, such as red or scaly skin.  Have fluid coming from your nipples that is not normal.  Find a lump or thick area that was not there before.  Have pain in your breasts.  Have any concerns about your breast health. Summary  Breast self-awareness includes looking for changes in your breasts, as well as feeling for changes within  your breasts.  Breast self-awareness should be done in front of a mirror in a well-lit room.  You should check your breasts every month. If you get menstrual periods, the best time to check your breasts is 5-7 days after your menstrual period is over.  Let your doctor know of any changes you see in your breasts, including changes in size, changes on the skin, pain or tenderness, or fluid from your nipples that is not normal. This information is not intended to replace advice given to you by your health care provider. Make sure you discuss any questions you have with your health care provider. Document Revised: 12/21/2017 Document Reviewed: 12/21/2017 Elsevier Patient Education  [place].

## 2020-07-30 ENCOUNTER — Inpatient Hospital Stay: Payer: Medicare Other

## 2020-07-30 ENCOUNTER — Inpatient Hospital Stay: Payer: Medicare Other | Attending: Oncology | Admitting: Oncology

## 2020-07-30 ENCOUNTER — Encounter: Payer: Self-pay | Admitting: Oncology

## 2020-07-30 VITALS — BP 122/58 | HR 62 | Temp 99.3°F | Resp 18 | Wt 201.9 lb

## 2020-07-30 DIAGNOSIS — Z79899 Other long term (current) drug therapy: Secondary | ICD-10-CM | POA: Diagnosis not present

## 2020-07-30 DIAGNOSIS — R011 Cardiac murmur, unspecified: Secondary | ICD-10-CM | POA: Insufficient documentation

## 2020-07-30 DIAGNOSIS — C50412 Malignant neoplasm of upper-outer quadrant of left female breast: Secondary | ICD-10-CM

## 2020-07-30 DIAGNOSIS — D631 Anemia in chronic kidney disease: Secondary | ICD-10-CM | POA: Diagnosis not present

## 2020-07-30 DIAGNOSIS — G4730 Sleep apnea, unspecified: Secondary | ICD-10-CM | POA: Diagnosis not present

## 2020-07-30 DIAGNOSIS — Z86010 Personal history of colonic polyps: Secondary | ICD-10-CM | POA: Insufficient documentation

## 2020-07-30 DIAGNOSIS — E119 Type 2 diabetes mellitus without complications: Secondary | ICD-10-CM | POA: Diagnosis not present

## 2020-07-30 DIAGNOSIS — Z170 Estrogen receptor positive status [ER+]: Secondary | ICD-10-CM

## 2020-07-30 DIAGNOSIS — N1831 Chronic kidney disease, stage 3a: Secondary | ICD-10-CM | POA: Insufficient documentation

## 2020-07-30 DIAGNOSIS — Z8673 Personal history of transient ischemic attack (TIA), and cerebral infarction without residual deficits: Secondary | ICD-10-CM | POA: Insufficient documentation

## 2020-07-30 DIAGNOSIS — R5383 Other fatigue: Secondary | ICD-10-CM | POA: Diagnosis not present

## 2020-07-30 DIAGNOSIS — N1832 Chronic kidney disease, stage 3b: Secondary | ICD-10-CM

## 2020-07-30 DIAGNOSIS — K219 Gastro-esophageal reflux disease without esophagitis: Secondary | ICD-10-CM | POA: Insufficient documentation

## 2020-07-30 DIAGNOSIS — M1990 Unspecified osteoarthritis, unspecified site: Secondary | ICD-10-CM | POA: Diagnosis not present

## 2020-07-30 DIAGNOSIS — I129 Hypertensive chronic kidney disease with stage 1 through stage 4 chronic kidney disease, or unspecified chronic kidney disease: Secondary | ICD-10-CM | POA: Diagnosis not present

## 2020-07-30 LAB — CBC WITH DIFFERENTIAL/PLATELET
Abs Immature Granulocytes: 0.04 10*3/uL (ref 0.00–0.07)
Basophils Absolute: 0.1 10*3/uL (ref 0.0–0.1)
Basophils Relative: 1 %
Eosinophils Absolute: 0.4 10*3/uL (ref 0.0–0.5)
Eosinophils Relative: 3 %
HCT: 32.3 % — ABNORMAL LOW (ref 36.0–46.0)
Hemoglobin: 10.9 g/dL — ABNORMAL LOW (ref 12.0–15.0)
Immature Granulocytes: 0 %
Lymphocytes Relative: 19 %
Lymphs Abs: 2.1 10*3/uL (ref 0.7–4.0)
MCH: 30.6 pg (ref 26.0–34.0)
MCHC: 33.7 g/dL (ref 30.0–36.0)
MCV: 90.7 fL (ref 80.0–100.0)
Monocytes Absolute: 1 10*3/uL (ref 0.1–1.0)
Monocytes Relative: 9 %
Neutro Abs: 7.5 10*3/uL (ref 1.7–7.7)
Neutrophils Relative %: 68 %
Platelets: 327 10*3/uL (ref 150–400)
RBC: 3.56 MIL/uL — ABNORMAL LOW (ref 3.87–5.11)
RDW: 13 % (ref 11.5–15.5)
WBC: 11.1 10*3/uL — ABNORMAL HIGH (ref 4.0–10.5)
nRBC: 0 % (ref 0.0–0.2)

## 2020-07-30 LAB — COMPREHENSIVE METABOLIC PANEL
ALT: 10 U/L (ref 0–44)
AST: 13 U/L — ABNORMAL LOW (ref 15–41)
Albumin: 3.8 g/dL (ref 3.5–5.0)
Alkaline Phosphatase: 41 U/L (ref 38–126)
Anion gap: 10 (ref 5–15)
BUN: 28 mg/dL — ABNORMAL HIGH (ref 8–23)
CO2: 26 mmol/L (ref 22–32)
Calcium: 8.7 mg/dL — ABNORMAL LOW (ref 8.9–10.3)
Chloride: 103 mmol/L (ref 98–111)
Creatinine, Ser: 1.4 mg/dL — ABNORMAL HIGH (ref 0.44–1.00)
GFR, Estimated: 39 mL/min — ABNORMAL LOW (ref >60)
Glucose, Bld: 89 mg/dL (ref 70–99)
Potassium: 4.4 mmol/L (ref 3.5–5.1)
Sodium: 139 mmol/L (ref 135–145)
Total Bilirubin: 0.5 mg/dL (ref 0.3–1.2)
Total Protein: 6.6 g/dL (ref 6.5–8.1)
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
## 2020-07-30 NOTE — Progress Notes (Signed)
Patient denies new problems/concerns today.   °

## 2020-08-15 DIAGNOSIS — H353211 Exudative age-related macular degeneration, right eye, with active choroidal neovascularization: Secondary | ICD-10-CM | POA: Diagnosis not present

## 2020-08-15 DIAGNOSIS — H353122 Nonexudative age-related macular degeneration, left eye, intermediate dry stage: Secondary | ICD-10-CM | POA: Diagnosis not present

## 2020-08-15 DIAGNOSIS — H35372 Puckering of macula, left eye: Secondary | ICD-10-CM | POA: Diagnosis not present

## 2020-08-15 DIAGNOSIS — D3132 Benign neoplasm of left choroid: Secondary | ICD-10-CM | POA: Diagnosis not present

## 2020-09-16 ENCOUNTER — Encounter: Payer: Self-pay | Admitting: Family Medicine

## 2020-09-16 ENCOUNTER — Other Ambulatory Visit: Payer: Self-pay

## 2020-09-16 ENCOUNTER — Ambulatory Visit (INDEPENDENT_AMBULATORY_CARE_PROVIDER_SITE_OTHER): Payer: Medicare Other | Admitting: Family Medicine

## 2020-09-16 VITALS — BP 136/68 | HR 65 | Temp 96.9°F | Ht 63.0 in | Wt 202.6 lb

## 2020-09-16 DIAGNOSIS — H353230 Exudative age-related macular degeneration, bilateral, stage unspecified: Secondary | ICD-10-CM | POA: Diagnosis not present

## 2020-09-16 DIAGNOSIS — L2082 Flexural eczema: Secondary | ICD-10-CM | POA: Diagnosis not present

## 2020-09-16 DIAGNOSIS — E7800 Pure hypercholesterolemia, unspecified: Secondary | ICD-10-CM

## 2020-09-16 DIAGNOSIS — I10 Essential (primary) hypertension: Secondary | ICD-10-CM | POA: Diagnosis not present

## 2020-09-16 DIAGNOSIS — R7303 Prediabetes: Secondary | ICD-10-CM | POA: Diagnosis not present

## 2020-09-16 DIAGNOSIS — H353290 Exudative age-related macular degeneration, unspecified eye, stage unspecified: Secondary | ICD-10-CM | POA: Insufficient documentation

## 2020-09-16 DIAGNOSIS — D631 Anemia in chronic kidney disease: Secondary | ICD-10-CM

## 2020-09-16 DIAGNOSIS — N1831 Chronic kidney disease, stage 3a: Secondary | ICD-10-CM | POA: Diagnosis not present

## 2020-09-16 DIAGNOSIS — Z6835 Body mass index (BMI) 35.0-35.9, adult: Secondary | ICD-10-CM

## 2020-09-16 DIAGNOSIS — N1832 Chronic kidney disease, stage 3b: Secondary | ICD-10-CM

## 2020-09-16 NOTE — Assessment & Plan Note (Signed)
Scattered areas on extremities for the most part Plan to continue fluocinonide per derm  Encouraged use of moisturizer

## 2020-09-16 NOTE — Assessment & Plan Note (Signed)
GFR of 39 and watching (also anemia) Disc imp of fluid intake and she has been lax  Plan to avoid nephrotoxic meds and keep ace dose low

## 2020-09-16 NOTE — Assessment & Plan Note (Signed)
bp in fair control at this time  BP Readings from Last 1 Encounters:  09/16/20 136/68   No changes needed Most recent labs reviewed  Disc lifstyle change with low sodium diet and exercise  Plan to continue amlodipine 10 mg daily and benazapril 20 mg daily  Doing ok w/o beta blocker

## 2020-09-16 NOTE — Assessment & Plan Note (Signed)
Getting injections now and tolerating well so far  It has helped

## 2020-09-16 NOTE — Assessment & Plan Note (Signed)
Lab Results  Component Value Date   HGBA1C 6.1 02/27/2020   disc imp of low glycemic diet and wt loss to prevent DM2  Enc more exercise as tolerated

## 2020-09-16 NOTE — Assessment & Plan Note (Signed)
Recent Hb of 10.9 (close to baseline per records) Followed by heme/onc  Had had venofer in the past so some iron def noted

## 2020-09-16 NOTE — Assessment & Plan Note (Signed)
Discussed how this problem influences overall health and the risks it imposes  Reviewed plan for weight loss with lower calorie diet (via better food choices and also portion control or program like weight watchers) and exercise building up to or more than 30 minutes 5 days per week including some aerobic activity   Exercise is as tolerated with pain/back

## 2020-09-16 NOTE — Assessment & Plan Note (Signed)
Disc goals for lipids and reasons to control them Rev last labs with pt Rev low sat fat diet in detail Controlled well with crestor 10 mg daily  LDL of 61
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
## 2020-09-16 NOTE — Patient Instructions (Addendum)
Sip fluid through day if you cannot drink a lot at once Kidney numbers are up  Optimally 64 oz of fluid daily mostly water   That will help your headaches   Follow up for annual exam after 10/13

## 2020-09-26 DIAGNOSIS — H34832 Tributary (branch) retinal vein occlusion, left eye, with macular edema: Secondary | ICD-10-CM | POA: Diagnosis not present

## 2020-09-26 DIAGNOSIS — H35412 Lattice degeneration of retina, left eye: Secondary | ICD-10-CM | POA: Diagnosis not present

## 2020-09-26 DIAGNOSIS — H353211 Exudative age-related macular degeneration, right eye, with active choroidal neovascularization: Secondary | ICD-10-CM | POA: Diagnosis not present

## 2020-09-26 DIAGNOSIS — H353122 Nonexudative age-related macular degeneration, left eye, intermediate dry stage: Secondary | ICD-10-CM | POA: Diagnosis not present

## 2020-11-06 DIAGNOSIS — H34832 Tributary (branch) retinal vein occlusion, left eye, with macular edema: Secondary | ICD-10-CM | POA: Diagnosis not present

## 2020-11-06 DIAGNOSIS — H353211 Exudative age-related macular degeneration, right eye, with active choroidal neovascularization: Secondary | ICD-10-CM | POA: Diagnosis not present

## 2020-11-06 DIAGNOSIS — H353122 Nonexudative age-related macular degeneration, left eye, intermediate dry stage: Secondary | ICD-10-CM | POA: Diagnosis not present

## 2020-11-19 DIAGNOSIS — Z20822 Contact with and (suspected) exposure to covid-19: Secondary | ICD-10-CM | POA: Diagnosis not present

## 2020-12-13 ENCOUNTER — Telehealth: Payer: Self-pay | Admitting: Family Medicine

## 2020-12-13 NOTE — Chronic Care Management (AMB) (Signed)
  Chronic Care Management   Note  12/13/2020 Name: ELISHEBA GRACIA MRN: [id number] DOB: 1941-04-15  KEILI SFERRA is a 80 y.o. year old female who is a primary care patient of Tower, [name], MD. I reached out to Lodema Hong by phone today in response to a referral sent by Ms. Dennie Fetters Salser's PCP, Tower, [name], MD.   Ms. Brotman was given information about Chronic Care Management services today including:  CCM service includes personalized support from designated clinical staff supervised by her physician, including individualized plan of care and coordination with other care providers 24/7 contact phone numbers for assistance for urgent and routine care needs. Service will only be billed when office clinical staff spend 20 minutes or more in a month to coordinate care. Only one practitioner may furnish and bill the service in a calendar month. The patient may stop CCM services at any time (effective at the end of the month) by phone call to the office staff.   Patient agreed to services and verbal consent obtained.   Follow up plan:  Tatjana Secretary/administrator

## 2020-12-30 ENCOUNTER — Telehealth: Payer: Self-pay | Admitting: Pharmacist

## 2020-12-30 NOTE — Progress Notes (Signed)
Chronic Care Management Pharmacy Assistant   Name: Ann Cummings  MRN: [id number] DOB: 1941/03/20  Ann Cummings is an 80 y.o. year old female who presents for his initial CCM visit with the clinical pharmacist.  Reason for Encounter: Initial Questions for CPP   Recent office visits:  09/16/20 Dr. Loura Pardon (PCP) Reason:Essential hypertension   Recent consult visits:  07/30/20 Dr. Edythe Clarity, Oncology Reason:Malignant neoplasm of upper-outer quadrant of left breast in female, estrogen receptor positive  Orders:CBC with Differential/Platelet,Comprehensive metabolic panel,FerritinProtein electrophoresis, serum,Iron and TIBC  Hospital visits:  None in previous 6 months  Medications: Outpatient Encounter Medications as of 12/30/2020  Medication Sig   acetaminophen (TYLENOL) 500 MG tablet Take 500 mg by mouth every 6 (six) hours as needed (FOR PAIN.).    albuterol (PROVENTIL HFA;VENTOLIN HFA) 108 (90 BASE) MCG/ACT inhaler Inhale 2 puffs into the lungs every 4 (four) hours as needed for wheezing.   albuterol (VENTOLIN HFA) 108 (90 Base) MCG/ACT inhaler Inhale 2 puffs into the lungs every 4 (four) hours as needed for wheezing.   amLODipine (NORVASC) 10 MG tablet Take 1 tablet (10 mg total) by mouth daily.   benazepril (LOTENSIN) 20 MG tablet Take 1 tablet (20 mg total) by mouth daily.   cetirizine (ZYRTEC) 10 MG tablet Take 10 mg by mouth daily as needed for allergies.   cholecalciferol (VITAMIN D) 400 units TABS tablet Take 400 Units by mouth.   citalopram (CELEXA) 40 MG tablet Take 1 tablet (40 mg total) by mouth at bedtime.   clobetasol ointment (TEMOVATE) 0.05 % Apply topically as needed. Twice daily as needed (Patient taking differently: Apply 1 application topically 2 (two) times daily as needed (for eczema.).)   clotrimazole-betamethasone (LOTRISONE) cream Apply 1 application topically 2 (two) times daily as needed (FOR ECZEMA.). APPLY TO AFFECTED AREA DAILY AS NEEDED    cyclobenzaprine (FLEXERIL) 10 MG tablet TAKE 1/2 TO 1 PILL UP TO 3 TIMES DAILY AS NEEDED FOR HEADACHE OR MUSCLE SPASM, CAUTION OF SEDATION   dicyclomine (BENTYL) 20 MG tablet Take 1 tablet (20 mg total) by mouth 2 (two) times daily before a meal.   diphenhydrAMINE (BENADRYL) 25 MG tablet Take 25 mg by mouth at bedtime as needed for itching or allergies.   fluocinonide (LIDEX) 0.05 % cream Apply 1 application topically 2 (two) times daily as needed (for skin irritation.).   furosemide (LASIX) 20 MG tablet Take 1 tablet (20 mg total) by mouth daily.   glucose blood (GE100 BLOOD GLUCOSE TEST) test strip Ck blood sugar once a day and as directed. Dx E11.9   Ketotifen Fumarate (ALAWAY OP) Place 1-2 drops into both eyes 3 (three) times daily as needed (for eye irritation.).    Lancets (ONETOUCH ULTRASOFT) lancets CHECK BLOOD GLUCOSE ONCE DAILY AND AS DIRECTED FOR DM 250.0   loperamide (IMODIUM) 2 MG capsule Take 2-4 mg by mouth 4 (four) times daily as needed for diarrhea or loose stools.   meclizine (ANTIVERT) 25 MG tablet Take 25 mg by mouth 3 (three) times daily as needed (for dizziness/vertigo).   omeprazole (PRILOSEC) 20 MG capsule Take 1 capsule (20 mg total) by mouth daily.   ONETOUCH DELICA LANCETS FINE MISC 1 each by Other route as directed. CHECK BLOOD GLUCOSE ONCE DAILY AND AS DIRECTED FOR DIABETES MELLITIS   rosuvastatin (CRESTOR) 10 MG tablet Take 0.5 tablets (5 mg total) by mouth daily.   sodium chloride (MURO 128) 5 % ophthalmic solution Place 1 drop into both  eyes 4 (four) times daily as needed for eye irritation.    Triamcinolone Acetonide (NASACORT ALLERGY 24HR NA) Place 1 spray into the nose daily as needed (for allergies.).    triamcinolone cream (KENALOG) 0.1 % Apply 1 application topically 2 (two) times daily. To affected areas   vitamin B-12 (CYANOCOBALAMIN) 1000 MCG tablet Take 1,000 mcg by mouth at bedtime.   No facility-administered encounter medications on file as of 12/30/2020.   Pharmacist Review  Have you seen any other providers since your last visit? Patient states that she saw Dr. Iona Hansen about her eyes  Any changes in your medications or health?  Patient states that she has not made any changes in her medications or health  Any side effects from any medications?  Patient states that she does not have any side effects from medication  Do you have an symptoms or problems not managed by your medications?  Patient states that she does not have any symptoms or problems not managed by medications  Any concerns about your health right now?  Patient states that her concern is psoriasis on her arms. She does take benadryl for the itching  Has your provider asked that you check blood pressure, blood sugar, or follow special diet at home? Patient states that she does not check blood pressure, her blood sugar stays in the range of 100, and she does not follow any special diet  Do you get any type of exercise on a regular basis?  Patient states that she does exercise  Can you think of a goal you would like to reach for your health? Patent states that she would like to lose weight and keep her Iron up  Do you have any problems getting your medications?  Patient states that she does not have any problems getting medications or the cost of medications from the pharmacy  Is there anything that you would like to discuss during the appointment? Patient states that she does not have anything specific to discuss at this time  Please bring medications and supplements to appointment   Care Gaps: annual exam 02/27/21  Star Rating Drugs: Benazepril 20 mg-last fill 12/14/20 90 ds Rosuvastatin 10 mg-last fill 12/20/20 90 ds  Ethelene Hal Clinical Pharmacist Assistant [phone]   Time spent:40

## 2021-01-02 DIAGNOSIS — H353122 Nonexudative age-related macular degeneration, left eye, intermediate dry stage: Secondary | ICD-10-CM | POA: Diagnosis not present

## 2021-01-02 DIAGNOSIS — H35412 Lattice degeneration of retina, left eye: Secondary | ICD-10-CM | POA: Diagnosis not present

## 2021-01-02 DIAGNOSIS — H353211 Exudative age-related macular degeneration, right eye, with active choroidal neovascularization: Secondary | ICD-10-CM | POA: Diagnosis not present

## 2021-01-02 DIAGNOSIS — H34832 Tributary (branch) retinal vein occlusion, left eye, with macular edema: Secondary | ICD-10-CM | POA: Diagnosis not present

## 2021-01-03 ENCOUNTER — Other Ambulatory Visit: Payer: Self-pay

## 2021-01-03 ENCOUNTER — Ambulatory Visit (INDEPENDENT_AMBULATORY_CARE_PROVIDER_SITE_OTHER): Payer: Medicare Other | Admitting: Pharmacist

## 2021-01-03 DIAGNOSIS — E7800 Pure hypercholesterolemia, unspecified: Secondary | ICD-10-CM | POA: Diagnosis not present

## 2021-01-03 DIAGNOSIS — F3289 Other specified depressive episodes: Secondary | ICD-10-CM | POA: Diagnosis not present

## 2021-01-03 DIAGNOSIS — K580 Irritable bowel syndrome with diarrhea: Secondary | ICD-10-CM

## 2021-01-03 DIAGNOSIS — L2082 Flexural eczema: Secondary | ICD-10-CM

## 2021-01-03 DIAGNOSIS — N1832 Chronic kidney disease, stage 3b: Secondary | ICD-10-CM

## 2021-01-03 DIAGNOSIS — I10 Essential (primary) hypertension: Secondary | ICD-10-CM | POA: Diagnosis not present

## 2021-01-03 DIAGNOSIS — G43109 Migraine with aura, not intractable, without status migrainosus: Secondary | ICD-10-CM

## 2021-01-03 DIAGNOSIS — J301 Allergic rhinitis due to pollen: Secondary | ICD-10-CM

## 2021-01-03 NOTE — Patient Instructions (Addendum)
Thank you for meeting with me to discuss your medications! I look forward to working with you to achieve your health care goals. Below is a summary of what we talked about during the visit:   Try Benadryl Cream (can take with hydrocortisone cream) Try to avoid Benadryl tablets. Take Allegra (or Claritin) instead. Get an Omron Blood pressure monitor and check BP  Try to avoid Sudafed (pseudoephredrine) or PE (phenylephrine) decongestants. Try Coricidin HBP instead.  There is a possibility that amlodipine is worsening the swelling in your legs. We may be able to adjust this, please monitor BP at home.   Visit Information  Phone number for Pharmacist: [phone]  Thank you for meeting with me to discuss your medications! I look forward to working with you to achieve your health care goals. Below is a summary of what we talked about during the visit:   Goals Addressed             This Visit's Progress    Manage My Medicine       Timeframe:  Long-Range Goal Priority:  High Start Date:          01/03/21                   Expected End Date:   01/03/22                    Follow Up Date Sept 2022   - call for medicine refill 2 or 3 days before it runs out - call if I am sick and can't take my medicine - keep a list of all the medicines I take; vitamins and herbals too  -Try Benadryl cream for rash/itching -Avoid Benadryl tablets. Take Allegra instead -Avoid Sudafed or PE decongestant product; try Coricidin HBP instead -Let PCP know if you want to pursue injections to prevent migraines   Why is this important?   These steps will help you keep on track with your medicines.   Notes:      Track and Manage My Blood Pressure-Hypertension       Timeframe:  Long-Range Goal Priority:  High Start Date:    01/03/21                         Expected End Date:  05/16/21                     Follow Up Date Sept 2022   - check blood pressure 3 times per week - choose a place to take my  blood pressure (home, clinic or office, retail store) - write blood pressure results in a log or diary -Get a new BP monitor (Omron brand is accurate)    Why is this important?   You won't feel high blood pressure, but it can still hurt your blood vessels.  High blood pressure can cause heart or kidney problems. It can also cause a stroke.  Making lifestyle changes like losing a little weight or eating less salt will help.  Checking your blood pressure at home and at different times of the day can help to control blood pressure.  If the doctor prescribes medicine remember to take it the way the doctor ordered.  Call the office if you cannot afford the medicine or if there are questions about it.     Notes:         Care Plan : [place]  Updates made by Charlton Haws, RPH since 01/03/2021 12:00 AM     Problem: Hypertension, Hyperlipidemia, GERD, Chronic Kidney Disease, Depression, and Allergic Rhinitis, IBS, Eczema   Priority: High     Long-Range Goal: Disease management   Start Date: 01/03/2021  Expected End Date: 01/03/2022  This Visit's Progress: On track  Priority: High  Note:   Current Barriers:  Unable to independently monitor therapeutic efficacy Suboptimal therapeutic regimen for HTN, migraines, eczema  Pharmacist Clinical Goal(s):  Patient will achieve adherence to monitoring guidelines and medication adherence to achieve therapeutic efficacy adhere to plan to optimize therapeutic regimen for HTN, migraines, eczema as evidenced by report of adherence to recommended medication management changes through collaboration with PharmD and provider.   Interventions: 1:1 collaboration with Tower, [name], MD regarding development and update of comprehensive plan of care as evidenced by provider attestation and co-signature Inter-disciplinary care team collaboration (see longitudinal plan of care) Comprehensive medication review performed; medication list  updated in electronic medical record  Hypertension / CKD stage 3b (BP goal <130/80) -Not ideally controlled - pt reports home readings are higher than office readings, unable to provide specific numbers; she reports she is taking furosemide for swelling, she denies swelling on daily basis, denies hx of heart failure; she does report several sisters have issues with lymphedema -Current home readings: unknown -Current treatment: Amlodipine 10 mg daily Benazepril 20 mg daily Furosemide 20 mg daily -Medications previously tried: atenolol, triamterene/htctz -Denies hypotensive/hypertensive symptoms -Educated on BP goals and benefits of medications for prevention of heart attack, stroke and kidney damage; Importance of home blood pressure monitoring; Proper BP monitoring technique; -Counseled to monitor BP at home daily - advised to get Omron monitor for accurate readings -Discussed swelling at length - possible causes, contributing factors including salt intake, kidney impariment, and amlodipine use; pt may benefit from reducing or d/c amlodipine and/or trial off furosemide; first we need home BP readings to determine baseline average -Plan: pt will get a new BP monitor and start checking BP at home -May consider reducing amlodipine to help with swelling, depending on home BP readings. F/U call in 1 month for home readings.  Hyperlipidemia: (LDL goal < 100) -Controlled - LDL is at goal; pt endorses compliance with statin and denies side effects; Triglycerides were elevated at last check, unknown if fasting lab or note -Current treatment: Rosuvastatin 10 mg - 1/2 tab daily -Educated on Cholesterol goals; Benefits of statin for ASCVD risk reduction; non-fasting can lead to falsely elevated triglyceridies -Counseled on benefits of OTC fish oil for triglycerides; pt would like to avoid adding more supplements, so advised to increase fish in diet -Recommended to continue current  medication  Depression/Anxiety (Goal: manage symptoms) -Controlled - pt endorses compliance with medication; she reports good control with mood currently -Current treatment: Citalopram 40 mg daily HS -PHQ9: 5 -GAD7: not on file -Connected with PCP for mental health support -Educated on Benefits of medication for symptom control -Recommended to continue current medication  IBS / GERD (Goal: manage symptoms) -Controlled - pt reports using loperamide before she leaves the house "just in case"; she has seen GI specialist previously and has been told to forego further colonoscopies; she denies issues with GERD -Current treatment  Dicyclomine 20 mg BID Loperamide 2 mg PRN Omeprazole 20 mg daily Probiotic -Recommended to continue current medication  Allergic rhinitis (Goal: manage symptoms) -Not ideally controlled - pt reports taking Benadryl BID for itching "all over";  -Current treatment  Benadryl 25 mg  BID Phenylephrine PRN Nasacort nasal spray PRN Albuterol HFA prn Ketotifen ([place]away) eye drops Systane eye drops -Counseled on potential risks of taking Benadryl chronically including memory impairment; pt and daughter report patient already suffers from memory issues -Plan: Take Allegra instead of Benadryl on daily basis; may reserve Benadryl for severe symptoms  Migraine headache (Goal: manage symptoms) -Uncontrolled - pt reports near daily headaches; she uses Tylenol and rest to help; she reports she has seen a neurologist for this before and they have tried gabapentin, atenolol but she could not tolerate -Current treatment  Cyclobenzaprine 10 mg PRN Tylenol 500 mg PRN -Medications previously tried: tramadol, gabapentin, atenolol -Educated on migraine prevention options - beta blockers, amitriptyline, topiramate are first line option; pt has not tolerated atenolol due to low pulse; would avoid amitriptyline due to anticholinergic properties and age; would avoid topiramate due to  potential to worsen cognitive issues; CGRP antagonists would be a good option -Recommend trial of CGRP antagonist (Emgality); pt will think about this and let PCP know if she wants to pursue it  Eczema (Goal: maange symptoms) -Not ideally controlled - pt reports worsening itching/rash lately; she has been using OTC products to manage; she has been prescribed Rx steroid creams in the past but these are expired and she does now use them -Current treatment  Hydrocortisone 1% cream (OTC) Neosporin PRN -Counseled on difference in potency between OTC hydrocortisone cream and prescription steroid creams -Recommended OTC Benadryl cream for rash/itching; if no improvement, make appt with PCP  Health Maintenance -Vaccine gaps: Shingrix, covid booster -Counseled on Shingrix and covid booster; pt will think about getting them at local pharmacy -Current therapy:  Vitamin B12 1000 mcg daily Vitamin D 1000 IU daily -Patient is satisfied with current therapy and denies issues -Recommended to continue current medication  Patient Goals/Self-Care Activities Patient will:  - take medications as prescribed focus on medication adherence by routine check blood pressure daily, document, and provide at future appointments -Get a new BP monitor (Omron brand is a good one) -Try Benadryl cream for rash/itching -Avoid Benadryl tablets. Take Allegra instead -Avoid Sudafed or PE decongestant product; try Coricidin HBP instead -Let PCP know if you want to pursue injections to prevent migraines      Ms. Sanagustin was given information about Chronic Care Management services today including:  CCM service includes personalized support from designated clinical staff supervised by her physician, including individualized plan of care and coordination with other care providers 24/7 contact phone numbers for assistance for urgent and routine care needs. Standard insurance, coinsurance, copays and deductibles apply for  chronic care management only during months in which we provide at least 20 minutes of these services. Most insurances cover these services at 100%, however patients may be responsible for any copay, coinsurance and/or deductible if applicable. This service may help you avoid the need for more expensive face-to-face services. Only one practitioner may furnish and bill the service in a calendar month. The patient may stop CCM services at any time (effective at the end of the month) by phone call to the office staff.  Patient agreed to services and verbal consent obtained.   Print copy of patient instructions, educational materials, and care plan provided in person. Telephone follow up appointment with pharmacy team member scheduled for:  Charlene Brooke, PharmD, BCACP, CPP Clinical Pharmacist [facility] [phone]

## 2021-01-03 NOTE — Progress Notes (Signed)
Chronic Care Management Pharmacy Note  01/03/2021 Name:  LAKIRA OGANDO MRN:  [id number] DOB:  01/02/1941  Summary: -Pt reports minimal swelling on furosemide; of note she is taking amlodipine 10 mg which may be worsening swelling, reducing or d/c amlodipine may allow d/c of furosemide. Unfortunately BP at home is unknown, pt not checking. -Pt is taking Benadryl daily for "all over itching"; this may be contributing to memory issues -Pt takes Sudafed PE products PRN for congestion/sinus; these can increase BP, HR -Pt reports near daily migraine headaches; she uses Tylenol and rests when these occur; preventative options were discussed - she has failed atenolol (low pulse), and would avoid TCA or topiramate due to potential to worsen cognition/memory  Recommendations/Changes made from today's visit: -Advised to get a new BP monitor and check BP at home. May be able to reduce or d/c amlodipine to help with swelling. F/u call 1 month to check on BP. -Try Benadryl cream for rash/itching -Avoid Benadryl tablets. Take Allegra instead -Avoid Sudafed or PE decongestant product; try Coricidin HBP instead -Trial of CGRP antagaonist (Emgality) recommended; pt will think about it and let PCP know if she would like to pursue this   Subjective: Ann Cummings is an 80 y.o. year old female who is a primary patient of Tower, [name], MD.  The CCM team was consulted for assistance with disease management and care coordination needs.    Engaged with patient face to face for initial visit in response to provider referral for pharmacy case management and/or care coordination services.   Consent to Services:  The patient was given the following information about Chronic Care Management services today, agreed to services, and gave verbal consent: 1. CCM service includes personalized support from designated clinical staff supervised by the primary care provider, including individualized plan of care and  coordination with other care providers 2. 24/7 contact phone numbers for assistance for urgent and routine care needs. 3. Service will only be billed when office clinical staff spend 20 minutes or more in a month to coordinate care. 4. Only one practitioner may furnish and bill the service in a calendar month. 5.The patient may stop CCM services at any time (effective at the end of the month) by phone call to the office staff. 6. The patient will be responsible for cost sharing (co-pay) of up to 20% of the service fee (after annual deductible is met). Patient agreed to services and consent obtained.  Patient Care Team: Tower, [name], MD as PCP - General Ninfa Linden Lind Guest, MD as Consulting Physician (Orthopedic Surgery) Earnie Larsson, MD as Consulting Physician (Neurosurgery) Oneta Rack, MD as Consulting Physician (Dermatology) Earlie Server, MD as Consulting Physician (Hematology and Oncology) Charlton Haws, [facility] as Pharmacist (Pharmacist)   Patient came with her daughter [name] today. Patient has lived in [place] for 34 years. She is retired and spends most of her time at home watching TV. She does not drive due to migraines.  Recent office visits: 09/16/20 Dr. Loura Pardon (PCP) Reason: CPE. No med changes. No labs.  Recent consult visits: 07/30/20 Dr. Edythe Clarity, Oncology Reason:Malignant neoplasm of upper-outer quadrant of left breast in female, estrogen receptor positive  Orders:CBC with Differential/Platelet,Comprehensive metabolic panel,FerritinProtein electrophoresis, serum,Iron and TIBC   Hospital visits: None in previous 6 months   Objective:  Lab Results  Component Value Date   CREATININE 1.40 (H) 07/30/2020   BUN 28 (H) 07/30/2020   GFR 38.71 (L) 02/23/2019   GFRNONAA  39 (L) 07/30/2020   GFRAA 46 (L) 01/17/2020   NA 139 07/30/2020   K 4.4 07/30/2020   CALCIUM 8.7 (L) 07/30/2020   CO2 26 07/30/2020   GLUCOSE 89 07/30/2020    Lab Results  Component Value  Date/Time   HGBA1C 6.1 02/27/2020 11:14 AM   HGBA1C 6.1 02/23/2019 08:59 AM   GFR 38.71 (L) 02/23/2019 08:59 AM   GFR 51.43 (L) 09/08/2017 10:20 AM   MICROALBUR 1.6 04/24/2009 08:42 AM   MICROALBUR 9.6 (H) 04/17/2008 10:08 AM    Last diabetic Eye exam:  Lab Results  Component Value Date/Time   HMDIABEYEEXA Retinopathy (A) 05/07/2020 12:00 AM    Last diabetic Foot exam:  Lab Results  Component Value Date/Time   HMDIABFOOTEX yes 08/21/2009 12:00 AM     Lab Results  Component Value Date   CHOL 144 02/27/2020   HDL 37.10 (L) 02/27/2020   LDLCALC 61 01/15/2015   LDLDIRECT 66.0 02/27/2020   TRIG 272.0 (H) 02/27/2020   CHOLHDL 4 02/27/2020    Hepatic Function Latest Ref Rng & Units 07/30/2020 01/17/2020 07/17/2019  Total Protein 6.5 - 8.1 g/dL 6.6 6.8 6.5  Albumin 3.5 - 5.0 g/dL 3.8 3.7 3.5  AST 15 - 41 U/L 13(L) 13(L) 13(L)  ALT 0 - 44 U/L '10 10 10  ' Alk Phosphatase 38 - 126 U/L 41 44 39  Total Bilirubin 0.3 - 1.2 mg/dL 0.5 0.2(L) 0.3  Bilirubin, Direct 0.0 - 0.3 mg/dL - - -    Lab Results  Component Value Date/Time   TSH 1.78 02/27/2020 11:14 AM   TSH 1.25 02/23/2019 08:59 AM    CBC Latest Ref Rng & Units 07/30/2020 01/17/2020 07/17/2019  WBC 4.0 - 10.5 K/uL 11.1(H) 10.1 7.5  Hemoglobin 12.0 - 15.0 g/dL 10.9(L) 11.3(L) 10.6(L)  Hematocrit 36.0 - 46.0 % 32.3(L) 33.4(L) 33.0(L)  Platelets 150 - 400 K/uL 327 328 321    Lab Results  Component Value Date/Time   VD25OH 21 (L) 05/16/2009 10:15 PM    Clinical ASCVD: No  The 10-year ASCVD risk score Mikey Bussing DC Jr., et al., 2013) is: 50.1%   Values used to calculate the score:     Age: 47 years     Sex: Female     Is Non-Hispanic African American: No     Diabetic: Yes     Tobacco smoker: No     Systolic Blood Pressure: 364 mmHg     Is BP treated: Yes     HDL Cholesterol: 37.1 mg/dL     Total Cholesterol: 144 mg/dL    Depression screen [facility] 2/9 02/27/2020 02/28/2019 02/17/2018  Decreased Interest 1 0 1  Down, Depressed,  Hopeless 0 0 1  PHQ - 2 Score 1 0 2  Altered sleeping 1 - 0  Tired, decreased energy 2 - 0  Change in appetite 1 - 0  Feeling bad or failure about yourself  0 - 0  Trouble concentrating 0 - 0  Moving slowly or fidgety/restless 0 - 0  Suicidal thoughts 0 - 0  PHQ-9 Score 5 - 2  Difficult doing work/chores Not difficult at all - Not difficult at all  Some recent data might be hidden    Social History   Tobacco Use  Smoking Status Never  Smokeless Tobacco Never   BP Readings from Last 3 Encounters:  09/16/20 136/68  07/30/20 (!) 122/58  07/29/20 (!) 172/68   Pulse Readings from Last 3 Encounters:  09/16/20 65  07/30/20 62  07/29/20  84   Wt Readings from Last 3 Encounters:  09/16/20 202 lb 9 oz (91.9 kg)  07/30/20 201 lb 14.4 oz (91.6 kg)  07/29/20 202 lb (91.6 kg)   BMI Readings from Last 3 Encounters:  09/16/20 35.88 kg/m  07/30/20 35.76 kg/m  07/29/20 35.78 kg/m    Assessment/Interventions: Review of patient past medical history, allergies, medications, health status, including review of consultants reports, laboratory and other test data, was performed as part of comprehensive evaluation and provision of chronic care management services.   SDOH:  (Social Determinants of Health) assessments and interventions performed: Yes  SDOH Screenings   Alcohol Screen: Not on file  Depression (PHQ2-9): Medium Risk   PHQ-2 Score: 5  Financial Resource Strain: Not on file  Food Insecurity: Not on file  Housing: Not on file  Physical Activity: Not on file  Social Connections: Not on file  Stress: Not on file  Tobacco Use: Low Risk    Smoking Tobacco Use: Never   Smokeless Tobacco Use: Never  Transportation Needs: Not on file    CCM Care Plan  Allergies  Allergen Reactions   Calcium     REACTION: severe constipation   Cetirizine Hcl     REACTION: reaction not known   Codeine     Per pt elevated blood pressure and caused haedache   Gabapentin     REACTION:  Confussion   Mometasone Furoate     REACTION: not effective   Prednisone Diarrhea    Stomach swollen and IBS   Ropinirole Hydrochloride     REACTION: lightheaded and felt like going to pass out   Ampicillin Rash    Rash all over body    Medications Reviewed Today     Reviewed by Charlton Haws, [facility] (Pharmacist) on 01/03/21 at 1217  Med List Status: <None>   Medication Order Taking? Sig Documenting Provider Last Dose Status Informant  acetaminophen (TYLENOL) 500 MG tablet [id number] Yes Take 500 mg by mouth every 6 (six) hours as needed (FOR PAIN.).  [provider] Taking Active Self  albuterol (VENTOLIN HFA) 108 (90 Base) MCG/ACT inhaler [id number] Yes Inhale 2 puffs into the lungs every 4 (four) hours as needed for wheezing. Tower, [name], MD Taking Active   amLODipine (NORVASC) 10 MG tablet [id number] Yes Take 1 tablet (10 mg total) by mouth daily. Tower, [name], MD Taking Active   Bacillus Coagulans-Inulin (PROBIOTIC) 1-250 BILLION-MG CAPS [id number] Yes Take by mouth. [provider] Taking Active   benazepril (LOTENSIN) 20 MG tablet [id number] Yes Take 1 tablet (20 mg total) by mouth daily. Tower, [name], MD Taking Active   Cholecalciferol 25 MCG (1000 UT) capsule [id number] Yes Take 1,000 Units by mouth. [provider] Taking Active   citalopram (CELEXA) 40 MG tablet [id number] Yes Take 1 tablet (40 mg total) by mouth at bedtime. Tower, [name], MD Taking Active   cyclobenzaprine (FLEXERIL) 10 MG tablet [id number] Yes TAKE 1/2 TO 1 PILL UP TO 3 TIMES DAILY AS NEEDED FOR HEADACHE OR MUSCLE SPASM, CAUTION OF SEDATION Tower, [name], MD Taking Active   dicyclomine (BENTYL) 20 MG tablet [id number] Yes Take 1 tablet (20 mg total) by mouth 2 (two) times daily before a meal. Tower, [name], MD Taking Active   diphenhydrAMINE (BENADRYL) 25 MG tablet [id number] Yes Take 25 mg by mouth at bedtime as needed for itching or allergies. [provider] Taking  Active Self  furosemide (LASIX) 20 MG tablet [id number] Yes Take  1 tablet (20 mg total) by mouth daily. Tower, [name], MD Taking Active   glucose blood (GE100 BLOOD GLUCOSE TEST) test strip [id number] Yes Ck blood sugar once a day and as directed. Dx E11.9 Tower, [name], MD Taking Active   hydrocortisone cream 1 % [id number] Yes Apply 1 application topically 2 (two) times daily. [provider] Taking Active   Ketotifen Fumarate (ALAWAY OP) [id number] Yes Place 1-2 drops into both eyes 3 (three) times daily as needed (for eye irritation.).  [provider] Taking Active Self  Lancets Glory Rosebush ULTRASOFT) lancets [id number] Yes CHECK BLOOD GLUCOSE ONCE DAILY AND AS DIRECTED FOR DM 250.0 Tower, [name], MD Taking Active Self  loperamide (IMODIUM) 2 MG capsule [id number] Yes Take 2-4 mg by mouth 4 (four) times daily as needed for diarrhea or loose stools. [provider] Taking Active Self  meclizine (ANTIVERT) 25 MG tablet [id number] Yes Take 25 mg by mouth 3 (three) times daily as needed (for dizziness/vertigo). [provider] Taking Active Self  neomycin-bacitracin-polymyxin (NEOSPORIN) ointment [id number] Yes Apply 1 application topically every 12 (twelve) hours. [provider] Taking Active   omeprazole (PRILOSEC) 20 MG capsule [id number] Yes Take 1 capsule (20 mg total) by mouth daily. Tower, [name], MD Taking Active   [facility] LANCETS FINE [place] [id number] Yes 1 each by Other route as directed. CHECK BLOOD GLUCOSE ONCE DAILY AND AS DIRECTED FOR DIABETES MELLITIS Tower, [name], MD Taking Active Self  rosuvastatin (CRESTOR) 10 MG tablet [id number] Yes Take 0.5 tablets (5 mg total) by mouth daily. Tower, [name], MD Taking Active   sodium chloride (MURO 128) 5 % ophthalmic solution [id number] Yes Place 1 drop into both eyes 4 (four) times daily as needed for eye irritation.  [provider] Taking Active Self  Triamcinolone Acetonide (NASACORT ALLERGY  24HR NA) [id number] Yes Place 1 spray into the nose daily as needed (for allergies.).  [provider] Taking Active Self           Med Note Tamala Julian, Sharlette Dense   Wed Apr 05, 2019  1:04 PM)    vitamin B-12 (CYANOCOBALAMIN) 1000 MCG tablet [id number] Yes Take 1,000 mcg by mouth at bedtime. [provider] Taking Active Self            Patient Active Problem List   Diagnosis Date Noted   Macular degeneration, wet ([place]) 09/16/2020   Anemia due to stage 3a chronic kidney disease ([place]) 07/30/2020   Chronic cough 02/27/2020   Dysphagia 02/27/2020   Migraine with aura 02/22/2018   Goals of care, counseling/discussion 10/14/2017   Malignant neoplasm of upper-outer quadrant of left breast in female, estrogen receptor positive ([place]) 08/02/2017   Intertrigo 02/19/2016   Class 2 obesity due to excess calories with body mass index (BMI) of 35.0 to 35.9 in adult 08/02/2015   Encounter for Medicare annual wellness exam 01/09/2014   Chronic kidney disease, stage 3b ([place]) 01/09/2014   Eczema 07/12/2013   Pedal edema 01/06/2013   Allergic rhinitis 08/21/2009   Iron deficiency anemia 07/09/2009   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   History of TV adenoma of colon 06/07/2009   OBSTRUCTIVE SLEEP APNEA 05/31/2009   ANEMIA, B12 DEFICIENCY 05/24/2009   Depression 05/15/2009   MEMORY LOSS 04/30/2009   Prediabetes 10/26/2007   LIPOMA, BACK 10/01/2006   Hyperlipidemia 10/01/2006   CARPAL TUNNEL SYNDROME 10/01/2006   Essential hypertension 10/01/2006   IBS 10/01/2006   FIBROCYSTIC BREAST DISEASE 10/01/2006  OSTEOARTHRITIS 10/01/2006   SPINAL STENOSIS 10/01/2006   BACK PAIN, CHRONIC 10/01/2006   MIGRAINES, HX OF 10/01/2006    Immunization History  Administered Date(s) Administered   Fluad Quad(high Dose 65+) 02/28/2019, 02/27/2020   Influenza Whole 02/25/2005, 04/30/2009, 03/17/2010   Influenza,inj,Quad PF,6+ Mos 02/10/2013, 01/09/2014, 02/01/2015, 02/03/2016, 02/10/2017,  03/03/2018   PFIZER(Purple Top)SARS-COV-2 Vaccination 11/02/2019, 11/23/2019   Pneumococcal Conjugate-13 01/09/2014   Pneumococcal Polysaccharide-23 04/23/2008   Td 04/13/1995, 10/14/2006    Conditions to be addressed/monitored:  Hypertension, Hyperlipidemia, GERD, Chronic Kidney Disease, Depression, and Allergic Rhinitis, IBS, Eczema  Care Plan : [place]  Updates made by Charlton Haws, [place] since 01/03/2021 12:00 AM     Problem: Hypertension, Hyperlipidemia, GERD, Chronic Kidney Disease, Depression, and Allergic Rhinitis, IBS, Eczema   Priority: High     Long-Range Goal: Disease management   Start Date: 01/03/2021  Expected End Date: 01/03/2022  This Visit's Progress: On track  Priority: High  Note:   Current Barriers:  Unable to independently monitor therapeutic efficacy Suboptimal therapeutic regimen for HTN, migraines, eczema  Pharmacist Clinical Goal(s):  Patient will achieve adherence to monitoring guidelines and medication adherence to achieve therapeutic efficacy adhere to plan to optimize therapeutic regimen for HTN, migraines, eczema as evidenced by report of adherence to recommended medication management changes through collaboration with PharmD and provider.   Interventions: 1:1 collaboration with Tower, [name], MD regarding development and update of comprehensive plan of care as evidenced by provider attestation and co-signature Inter-disciplinary care team collaboration (see longitudinal plan of care) Comprehensive medication review performed; medication list updated in electronic medical record  Hypertension / CKD stage 3b (BP goal <130/80) -Not ideally controlled - pt reports home readings are higher than office readings, unable to provide specific numbers; she reports she is taking furosemide for swelling, she denies swelling on daily basis, denies hx of heart failure; she does report several sisters have issues with lymphedema -Current home  readings: unknown -Current treatment: Amlodipine 10 mg daily Benazepril 20 mg daily Furosemide 20 mg daily -Medications previously tried: atenolol, triamterene/htctz -Denies hypotensive/hypertensive symptoms -Educated on BP goals and benefits of medications for prevention of heart attack, stroke and kidney damage; Importance of home blood pressure monitoring; Proper BP monitoring technique; -Counseled to monitor BP at home daily - advised to get Omron monitor for accurate readings -Discussed swelling at length - possible causes, contributing factors including salt intake, kidney impariment, and amlodipine use; pt may benefit from reducing or d/c amlodipine and/or trial off furosemide; first we need home BP readings to determine baseline average -Plan: pt will get a new BP monitor and start checking BP at home -May consider reducing amlodipine to help with swelling, depending on home BP readings. F/U call in 1 month for home readings.  Hyperlipidemia: (LDL goal < 100) -Controlled - LDL is at goal; pt endorses compliance with statin and denies side effects; Triglycerides were elevated at last check, unknown if fasting lab or note -Current treatment: Rosuvastatin 10 mg - 1/2 tab daily -Educated on Cholesterol goals; Benefits of statin for ASCVD risk reduction; non-fasting can lead to falsely elevated triglyceridies -Counseled on benefits of OTC fish oil for triglycerides; pt would like to avoid adding more supplements, so advised to increase fish in diet -Recommended to continue current medication  Depression/Anxiety (Goal: manage symptoms) -Controlled - pt endorses compliance with medication; she reports good control with mood currently -Current treatment: Citalopram 40 mg daily HS -PHQ9: 5 -GAD7: not on file -Connected with  PCP for mental health support -Educated on Benefits of medication for symptom control -Recommended to continue current medication  IBS / GERD (Goal: manage  symptoms) -Controlled - pt reports using loperamide before she leaves the house "just in case"; she has seen GI specialist previously and has been told to forego further colonoscopies; she denies issues with GERD -Current treatment  Dicyclomine 20 mg BID Loperamide 2 mg PRN Omeprazole 20 mg daily Probiotic -Recommended to continue current medication  Allergic rhinitis (Goal: manage symptoms) -Not ideally controlled - pt reports taking Benadryl BID for itching "all over";  -Current treatment  Benadryl 25 mg BID Phenylephrine PRN Nasacort nasal spray PRN Albuterol HFA prn Ketotifen ([place]) eye drops Systane eye drops -Counseled on potential risks of taking Benadryl chronically including memory impairment; pt and daughter report patient already suffers from memory issues -Plan: Take Allegra instead of Benadryl on daily basis; may reserve Benadryl for severe symptoms  Migraine headache (Goal: manage symptoms) -Uncontrolled - pt reports near daily headaches; she uses Tylenol and rest to help; she reports she has seen a neurologist for this before and they have tried gabapentin, atenolol but she could not tolerate -Current treatment  Cyclobenzaprine 10 mg PRN Tylenol 500 mg PRN -Medications previously tried: tramadol, gabapentin, atenolol -Educated on migraine prevention options - beta blockers, amitriptyline, topiramate are first line option; pt has not tolerated atenolol due to low pulse; would avoid amitriptyline due to anticholinergic properties and age; would avoid topiramate due to potential to worsen cognitive issues; CGRP antagonists would be a good option -Recommend trial of CGRP antagonist (Emgality); pt will think about this and let PCP know if she wants to pursue it  Eczema (Goal: maange symptoms) -Not ideally controlled - pt reports worsening itching/rash lately; she has been using OTC products to manage; she has been prescribed Rx steroid creams in the past but these are  expired and she does now use them -Current treatment  Hydrocortisone 1% cream (OTC) Neosporin PRN -Counseled on difference in potency between OTC hydrocortisone cream and prescription steroid creams -Recommended OTC Benadryl cream for rash/itching; if no improvement, make appt with PCP  Health Maintenance -Vaccine gaps: Shingrix, covid booster -Counseled on Shingrix and covid booster; pt will think about getting them at local pharmacy -Current therapy:  Vitamin B12 1000 mcg daily Vitamin D 1000 IU daily -Patient is satisfied with current therapy and denies issues -Recommended to continue current medication  Patient Goals/Self-Care Activities Patient will:  - take medications as prescribed focus on medication adherence by routine check blood pressure daily, document, and provide at future appointments -Get a new BP monitor (Omron brand is a good one) -Try Benadryl cream for rash/itching -Avoid Benadryl tablets. Take Allegra instead -Avoid Sudafed or PE decongestant product; try Coricidin HBP instead -Let PCP know if you want to pursue injections to prevent migraines      Medication Assistance: None required.  Patient affirms current coverage meets needs.  Compliance/Adherence/Medication fill history: Care Gaps: Hep C screening Colonoscopy (due 04/24/19) A1c (due 08/27/20) Vaccines - Shingrix, Covid booster (due 12/21/19)  Star-Rating Drugs: Benazepril - LF 12/14/20 x 90 ds Rosuvastatin - LF 12/20/20 x 90 ds  Patient's preferred pharmacy is:  [facility], [place], [place] [address], [place] [address] Phone: [phone] Fax: [phone]  [id number] [place] [place] [place]Phone: [phone]: [phone] Uses pill box? No - prefers  bottles Pt endorses 100% compliance  We discussed: Current pharmacy is preferred with insurance plan and patient is  satisfied with pharmacy services Patient decided to: Continue current medication management strategy  Care Plan and Follow Up Patient Decision:  Patient agrees to Care Plan and Follow-up.  Plan: Telephone follow up appointment with care management team member scheduled for:  1 month  Charlene Brooke, PharmD, [place], CPP Clinical Pharmacist [facility] [phone]

## 2021-01-20 DIAGNOSIS — Z20822 Contact with and (suspected) exposure to covid-19: Secondary | ICD-10-CM | POA: Diagnosis not present

## 2021-01-24 ENCOUNTER — Other Ambulatory Visit: Payer: Medicare Other

## 2021-01-24 ENCOUNTER — Inpatient Hospital Stay: Payer: Medicare Other | Attending: Oncology

## 2021-01-24 DIAGNOSIS — D631 Anemia in chronic kidney disease: Secondary | ICD-10-CM | POA: Insufficient documentation

## 2021-01-24 DIAGNOSIS — J45909 Unspecified asthma, uncomplicated: Secondary | ICD-10-CM | POA: Insufficient documentation

## 2021-01-24 DIAGNOSIS — Z794 Long term (current) use of insulin: Secondary | ICD-10-CM | POA: Diagnosis not present

## 2021-01-24 DIAGNOSIS — Z923 Personal history of irradiation: Secondary | ICD-10-CM | POA: Diagnosis not present

## 2021-01-24 DIAGNOSIS — C50412 Malignant neoplasm of upper-outer quadrant of left female breast: Secondary | ICD-10-CM | POA: Insufficient documentation

## 2021-01-24 DIAGNOSIS — F32A Depression, unspecified: Secondary | ICD-10-CM | POA: Diagnosis not present

## 2021-01-24 DIAGNOSIS — N1832 Chronic kidney disease, stage 3b: Secondary | ICD-10-CM | POA: Diagnosis not present

## 2021-01-24 DIAGNOSIS — Z170 Estrogen receptor positive status [ER+]: Secondary | ICD-10-CM | POA: Insufficient documentation

## 2021-01-24 DIAGNOSIS — Z8673 Personal history of transient ischemic attack (TIA), and cerebral infarction without residual deficits: Secondary | ICD-10-CM | POA: Diagnosis not present

## 2021-01-24 DIAGNOSIS — E119 Type 2 diabetes mellitus without complications: Secondary | ICD-10-CM | POA: Insufficient documentation

## 2021-01-24 DIAGNOSIS — Z79899 Other long term (current) drug therapy: Secondary | ICD-10-CM | POA: Diagnosis not present

## 2021-01-24 DIAGNOSIS — I10 Essential (primary) hypertension: Secondary | ICD-10-CM | POA: Diagnosis not present

## 2021-01-24 LAB — COMPREHENSIVE METABOLIC PANEL
ALT: 11 U/L (ref 0–44)
AST: 13 U/L — ABNORMAL LOW (ref 15–41)
Albumin: 3.8 g/dL (ref 3.5–5.0)
Alkaline Phosphatase: 46 U/L (ref 38–126)
Anion gap: 8 (ref 5–15)
BUN: 31 mg/dL — ABNORMAL HIGH (ref 8–23)
CO2: 25 mmol/L (ref 22–32)
Calcium: 8.9 mg/dL (ref 8.9–10.3)
Chloride: 104 mmol/L (ref 98–111)
Creatinine, Ser: 1.37 mg/dL — ABNORMAL HIGH (ref 0.44–1.00)
GFR, Estimated: 40 mL/min — ABNORMAL LOW (ref >60)
Glucose, Bld: 89 mg/dL (ref 70–99)
Potassium: 4.5 mmol/L (ref 3.5–5.1)
Sodium: 137 mmol/L (ref 135–145)
Total Bilirubin: 0.2 mg/dL — ABNORMAL LOW (ref 0.3–1.2)
Total Protein: 6.7 g/dL (ref 6.5–8.1)

## 2021-01-24 LAB — CBC WITH DIFFERENTIAL/PLATELET
Abs Immature Granulocytes: 0.03 10*3/uL (ref 0.00–0.07)
Basophils Absolute: 0 10*3/uL (ref 0.0–0.1)
Basophils Relative: 0 %
Eosinophils Absolute: 0.4 10*3/uL (ref 0.0–0.5)
Eosinophils Relative: 4 %
HCT: 32.9 % — ABNORMAL LOW (ref 36.0–46.0)
Hemoglobin: 10.8 g/dL — ABNORMAL LOW (ref 12.0–15.0)
Immature Granulocytes: 0 %
Lymphocytes Relative: 20 %
Lymphs Abs: 1.9 10*3/uL (ref 0.7–4.0)
MCH: 29.8 pg (ref 26.0–34.0)
MCHC: 32.8 g/dL (ref 30.0–36.0)
MCV: 90.9 fL (ref 80.0–100.0)
Monocytes Absolute: 0.8 10*3/uL (ref 0.1–1.0)
Monocytes Relative: 8 %
Neutro Abs: 6.1 10*3/uL (ref 1.7–7.7)
Neutrophils Relative %: 68 %
Platelets: 307 10*3/uL (ref 150–400)
RBC: 3.62 MIL/uL — ABNORMAL LOW (ref 3.87–5.11)
RDW: 12.8 % (ref 11.5–15.5)
WBC: 9.2 10*3/uL (ref 4.0–10.5)
nRBC: 0 % (ref 0.0–0.2)

## 2021-01-24 LAB — IRON AND TIBC
Iron: 43 ug/dL (ref 28–170)
Saturation Ratios: 13 % (ref 10.4–31.8)
TIBC: 322 ug/dL (ref 250–450)
UIBC: 279 ug/dL

## 2021-01-24 LAB — FERRITIN: Ferritin: 58 ng/mL (ref 11–307)

## 2021-01-28 LAB — PROTEIN ELECTROPHORESIS, SERUM
A/G Ratio: 1.2 (ref 0.7–1.7)
Albumin ELP: 3.4 g/dL (ref 2.9–4.4)
Alpha-1-Globulin: 0.3 g/dL (ref 0.0–0.4)
Alpha-2-Globulin: 1.1 g/dL — ABNORMAL HIGH (ref 0.4–1.0)
Beta Globulin: 1 g/dL (ref 0.7–1.3)
Gamma Globulin: 0.4 g/dL (ref 0.4–1.8)
Globulin, Total: 2.8 g/dL (ref 2.2–3.9)
Total Protein ELP: 6.2 g/dL (ref 6.0–8.5)

## 2021-01-29 ENCOUNTER — Telehealth: Payer: Medicare Other

## 2021-01-31 ENCOUNTER — Encounter: Payer: Self-pay | Admitting: Oncology

## 2021-01-31 ENCOUNTER — Inpatient Hospital Stay (HOSPITAL_BASED_OUTPATIENT_CLINIC_OR_DEPARTMENT_OTHER): Payer: Medicare Other | Admitting: Oncology

## 2021-01-31 VITALS — BP 146/73 | HR 68 | Temp 99.0°F | Resp 17 | Wt 197.0 lb

## 2021-01-31 DIAGNOSIS — D631 Anemia in chronic kidney disease: Secondary | ICD-10-CM

## 2021-01-31 DIAGNOSIS — C50412 Malignant neoplasm of upper-outer quadrant of left female breast: Secondary | ICD-10-CM

## 2021-01-31 DIAGNOSIS — Z923 Personal history of irradiation: Secondary | ICD-10-CM | POA: Diagnosis not present

## 2021-01-31 DIAGNOSIS — Z1231 Encounter for screening mammogram for malignant neoplasm of breast: Secondary | ICD-10-CM

## 2021-01-31 DIAGNOSIS — Z170 Estrogen receptor positive status [ER+]: Secondary | ICD-10-CM | POA: Diagnosis not present

## 2021-01-31 DIAGNOSIS — N1832 Chronic kidney disease, stage 3b: Secondary | ICD-10-CM

## 2021-01-31 DIAGNOSIS — F32A Depression, unspecified: Secondary | ICD-10-CM | POA: Diagnosis not present
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
## 2021-02-13 ENCOUNTER — Telehealth: Payer: Self-pay | Admitting: Family Medicine

## 2021-02-13 MED ORDER — GLUCOSE BLOOD VI STRP: ORAL_STRIP | 1 refills | Status: DC

## 2021-02-13 MED ORDER — GE100 BLOOD GLUCOSE TEST VI STRP: ORAL_STRIP | 0 refills | Status: DC

## 2021-02-13 NOTE — Telephone Encounter (Signed)
  Encourage patient to contact the pharmacy for refills or they can request refills through [facility]:  Please schedule appointment if longer than 1 year  NEXT APPOINTMENT DATE:03/03/21  MEDICATION:glucose blood (GE100 BLOOD GLUCOSE TEST) test strip  Is the patient out of medication? No  PHARMACY:[id number] - [place], [place] [address]  Let patient know to contact pharmacy at the end of the day to make sure medication is ready.  Please notify patient to allow 48-72 hours to process  CLINICAL FILLS OUT ALL BELOW:   LAST REFILL:  QTY:  REFILL DATE:    OTHER COMMENTS:    Okay for refill?  Please advise

## 2021-02-13 NOTE — Addendum Note (Signed)
Addended by: Tammi Sou on: 02/13/2021 12:37 PM   Modules accepted: Orders

## 2021-02-23 ENCOUNTER — Telehealth: Payer: Self-pay | Admitting: Family Medicine

## 2021-02-23 DIAGNOSIS — D508 Other iron deficiency anemias: Secondary | ICD-10-CM

## 2021-02-23 DIAGNOSIS — R7303 Prediabetes: Secondary | ICD-10-CM

## 2021-02-23 DIAGNOSIS — N1831 Chronic kidney disease, stage 3a: Secondary | ICD-10-CM

## 2021-02-23 DIAGNOSIS — E7800 Pure hypercholesterolemia, unspecified: Secondary | ICD-10-CM

## 2021-02-23 DIAGNOSIS — N1832 Chronic kidney disease, stage 3b: Secondary | ICD-10-CM

## 2021-02-23 DIAGNOSIS — I10 Essential (primary) hypertension: Secondary | ICD-10-CM

## 2021-02-23 NOTE — Telephone Encounter (Signed)
-----   Message from Ellamae Sia sent at 02/06/2021  8:09 AM EDT ----- Regarding: Lab orders for Monday, 10.10.22 Patient is scheduled for CPX labs, please order future labs, Thanks , Karna Christmas

## 2021-02-24 ENCOUNTER — Other Ambulatory Visit: Payer: Medicare Other

## 2021-03-03 ENCOUNTER — Telehealth: Payer: Self-pay | Admitting: Family Medicine

## 2021-03-03 ENCOUNTER — Encounter: Payer: Medicare Other | Admitting: Family Medicine

## 2021-03-11 NOTE — Telephone Encounter (Signed)
Pt has called stating the pharmacy is requesting a prior auth for the DICYCLOMINE   Please advise

## 2021-03-13 DIAGNOSIS — H34832 Tributary (branch) retinal vein occlusion, left eye, with macular edema: Secondary | ICD-10-CM | POA: Diagnosis not present

## 2021-03-13 DIAGNOSIS — D3132 Benign neoplasm of left choroid: Secondary | ICD-10-CM | POA: Diagnosis not present

## 2021-03-13 DIAGNOSIS — H35372 Puckering of macula, left eye: Secondary | ICD-10-CM | POA: Diagnosis not present

## 2021-03-13 DIAGNOSIS — H353211 Exudative age-related macular degeneration, right eye, with active choroidal neovascularization: Secondary | ICD-10-CM | POA: Diagnosis not present

## 2021-03-16 ENCOUNTER — Other Ambulatory Visit: Payer: Self-pay | Admitting: Family Medicine

## 2021-03-21 ENCOUNTER — Ambulatory Visit (INDEPENDENT_AMBULATORY_CARE_PROVIDER_SITE_OTHER): Payer: Medicare Other | Admitting: Family Medicine

## 2021-03-21 ENCOUNTER — Other Ambulatory Visit: Payer: Self-pay

## 2021-03-21 ENCOUNTER — Telehealth: Payer: Self-pay | Admitting: *Deleted

## 2021-03-21 ENCOUNTER — Encounter: Payer: Self-pay | Admitting: Family Medicine

## 2021-03-21 VITALS — BP 145/70 | HR 75 | Temp 98.3°F | Ht 63.5 in | Wt 197.5 lb

## 2021-03-21 DIAGNOSIS — N1832 Chronic kidney disease, stage 3b: Secondary | ICD-10-CM | POA: Diagnosis not present

## 2021-03-21 DIAGNOSIS — D631 Anemia in chronic kidney disease: Secondary | ICD-10-CM

## 2021-03-21 DIAGNOSIS — H353230 Exudative age-related macular degeneration, bilateral, stage unspecified: Secondary | ICD-10-CM | POA: Diagnosis not present

## 2021-03-21 DIAGNOSIS — D518 Other vitamin B12 deficiency anemias: Secondary | ICD-10-CM | POA: Diagnosis not present

## 2021-03-21 DIAGNOSIS — R7303 Prediabetes: Secondary | ICD-10-CM

## 2021-03-21 DIAGNOSIS — N1831 Chronic kidney disease, stage 3a: Secondary | ICD-10-CM

## 2021-03-21 DIAGNOSIS — D508 Other iron deficiency anemias: Secondary | ICD-10-CM

## 2021-03-21 DIAGNOSIS — Z79899 Other long term (current) drug therapy: Secondary | ICD-10-CM | POA: Insufficient documentation

## 2021-03-21 DIAGNOSIS — I10 Essential (primary) hypertension: Secondary | ICD-10-CM

## 2021-03-21 DIAGNOSIS — Z23 Encounter for immunization: Secondary | ICD-10-CM

## 2021-03-21 DIAGNOSIS — G43109 Migraine with aura, not intractable, without status migrainosus: Secondary | ICD-10-CM | POA: Diagnosis not present

## 2021-03-21 DIAGNOSIS — L2082 Flexural eczema: Secondary | ICD-10-CM

## 2021-03-21 DIAGNOSIS — E7800 Pure hypercholesterolemia, unspecified: Secondary | ICD-10-CM

## 2021-03-21 LAB — COMPREHENSIVE METABOLIC PANEL
ALT: 8 U/L (ref 0–35)
AST: 12 U/L (ref 0–37)
Albumin: 4.1 g/dL (ref 3.5–5.2)
Alkaline Phosphatase: 41 U/L (ref 39–117)
BUN: 25 mg/dL — ABNORMAL HIGH (ref 6–23)
CO2: 28 mEq/L (ref 19–32)
Calcium: 9.1 mg/dL (ref 8.4–10.5)
Chloride: 105 mEq/L (ref 96–112)
Creatinine, Ser: 1.23 mg/dL — ABNORMAL HIGH (ref 0.40–1.20)
GFR: 42.01 mL/min — ABNORMAL LOW (ref >60.00)
Glucose, Bld: 88 mg/dL (ref 70–99)
Potassium: 4.7 mEq/L (ref 3.5–5.1)
Sodium: 140 mEq/L (ref 135–145)
Total Bilirubin: 0.4 mg/dL (ref 0.2–1.2)
Total Protein: 6.3 g/dL (ref 6.0–8.3)

## 2021-03-21 LAB — CBC WITH DIFFERENTIAL/PLATELET
Basophils Absolute: 0 10*3/uL (ref 0.0–0.1)
Basophils Relative: 0.4 % (ref 0.0–3.0)
Eosinophils Absolute: 0.5 10*3/uL (ref 0.0–0.7)
Eosinophils Relative: 4.7 % (ref 0.0–5.0)
HCT: 32.3 % — ABNORMAL LOW (ref 36.0–46.0)
Hemoglobin: 10.7 g/dL — ABNORMAL LOW (ref 12.0–15.0)
Lymphocytes Relative: 15.9 % (ref 12.0–46.0)
Lymphs Abs: 1.8 10*3/uL (ref 0.7–4.0)
MCHC: 33.1 g/dL (ref 30.0–36.0)
MCV: 89 fl (ref 78.0–100.0)
Monocytes Absolute: 0.7 10*3/uL (ref 0.1–1.0)
Monocytes Relative: 6.3 % (ref 3.0–12.0)
Neutro Abs: 8.4 10*3/uL — ABNORMAL HIGH (ref 1.4–7.7)
Neutrophils Relative %: 72.7 % (ref 43.0–77.0)
Platelets: 327 10*3/uL (ref 150.0–400.0)
RBC: 3.62 Mil/uL — ABNORMAL LOW (ref 3.87–5.11)
RDW: 13.6 % (ref 11.5–15.5)
WBC: 11.5 10*3/uL — ABNORMAL HIGH (ref 4.0–10.5)

## 2021-03-21 LAB — LIPID PANEL
Cholesterol: 134 mg/dL (ref 0–200)
HDL: 39.4 mg/dL (ref >39.00)
NonHDL: 94.38
Total CHOL/HDL Ratio: 3
Triglycerides: 205 mg/dL — ABNORMAL HIGH (ref 0.0–149.0)
VLDL: 41 mg/dL — ABNORMAL HIGH (ref 0.0–40.0)

## 2021-03-21 LAB — HEMOGLOBIN A1C: Hgb A1c MFr Bld: 6 % (ref 4.6–6.5)

## 2021-03-21 LAB — TSH: TSH: 1.32 u[IU]/mL (ref 0.35–5.50)

## 2021-03-21 LAB — LDL CHOLESTEROL, DIRECT: Direct LDL: 61 mg/dL

## 2021-03-21 LAB — VITAMIN B12: Vitamin B-12: 819 pg/mL (ref 211–911)

## 2021-03-21 MED ORDER — TRIAMCINOLONE ACETONIDE 0.1 % EX CREA: 1.0000 "application " | TOPICAL_CREAM | Freq: Two times a day (BID) | CUTANEOUS | 3 refills | Status: DC

## 2021-03-21 MED ORDER — FUROSEMIDE 20 MG PO TABS: 20.0000 mg | ORAL_TABLET | Freq: Every day | ORAL | 3 refills | Status: DC

## 2021-03-21 MED ORDER — BENAZEPRIL HCL 20 MG PO TABS: 20.0000 mg | ORAL_TABLET | Freq: Every day | ORAL | 3 refills | Status: DC

## 2021-03-21 MED ORDER — DICYCLOMINE HCL 20 MG PO TABS: 20.0000 mg | ORAL_TABLET | Freq: Two times a day (BID) | ORAL | 1 refills | Status: DC

## 2021-03-21 MED ORDER — AMLODIPINE BESYLATE 10 MG PO TABS: 10.0000 mg | ORAL_TABLET | Freq: Every day | ORAL | 3 refills | Status: DC

## 2021-03-21 MED ORDER — OMEPRAZOLE 20 MG PO CPDR: 20.0000 mg | DELAYED_RELEASE_CAPSULE | Freq: Every day | ORAL | 3 refills | Status: DC

## 2021-03-21 MED ORDER — CITALOPRAM HYDROBROMIDE 40 MG PO TABS: 40.0000 mg | ORAL_TABLET | Freq: Every day | ORAL | 3 refills | Status: DC

## 2021-03-21 NOTE — Assessment & Plan Note (Signed)
Lab today Disc goals for lipids and reasons to control them Rev last labs with pt Rev low sat fat diet in detail Taking crestor 10 mg daily

## 2021-03-21 NOTE — Assessment & Plan Note (Signed)
A1C ordered °disc imp of low glycemic diet and wt loss to prevent DM2  °

## 2021-03-21 NOTE — Telephone Encounter (Signed)
PA done at [email or web address] for pt's bentyl, I will await a response
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
## 2021-03-21 NOTE — Assessment & Plan Note (Signed)
B12 level today Some fatigue
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
## 2021-03-21 NOTE — Assessment & Plan Note (Signed)
Worse lately on LEs and trunk   Recommend she avoid harsh detergents and hot water  Use non scented moisturizer  Px triamcinolone cream 0.1% bid  Update if not imp

## 2021-03-21 NOTE — Assessment & Plan Note (Signed)
Lab today.

## 2021-03-21 NOTE — Assessment & Plan Note (Signed)
B12 added to labs 

## 2021-03-21 NOTE — Assessment & Plan Note (Signed)
Lab today  Stressed imp of fluid intake 64 oz daily

## 2021-03-21 NOTE — Assessment & Plan Note (Signed)
bp in fair control at this time  BP Readings from Last 1 Encounters:  03/21/21 (!) 145/70   No changes needed Most recent labs reviewed  Disc lifstyle change with low sodium diet and exercise  Continues  Amlodipine 10 mg daily  Benazepril 20 mg daily  Will re check when less anxious  Also her cuff runs high/will stop using it

## 2021-03-21 NOTE — Patient Instructions (Addendum)
Our goal is 64 oz fluid daily -mostly water or dilute non sugar sources   Labs today  Flu shot today   Get a new  BP cuff   We will have you follow up after jan1  for your medicare wellness

## 2021-03-21 NOTE — Assessment & Plan Note (Signed)
Ongoing In treatment

## 2021-03-21 NOTE — Assessment & Plan Note (Signed)
Cbc today  Followed by heme/onc Also renal insuff causing anemia of chronic dz

## 2021-03-24 NOTE — Telephone Encounter (Signed)
Approval for coverage has been received. Will end 03/21/2022. Letter received from insurance sent to scan. My chart sent to patient to let know approved and should be able to pick up at pharmacy. If any problems call our office.

## 2021-03-27 ENCOUNTER — Encounter: Payer: Self-pay | Admitting: *Deleted

## 2021-03-31 ENCOUNTER — Telehealth: Payer: Self-pay

## 2021-03-31 NOTE — Chronic Care Management (AMB) (Signed)
Chronic Care Management Pharmacy Assistant   Name: Ann Cummings  MRN: [id number] DOB: Jun 14, 1940    Reason for Encounter: Hypertension Disease State   Recent office visits:  03/21/21-PCP-Marne Tower,MD-Patient presented for fatigue,brain fog.start triamcinolone cream 0.1% use twice daily for eczema.Labs ordered,flu shot given,continues tylenol and flexeril as needed.Suggested to stop using BP cuff at home due to high readings and advised to buy new cuff.Follow up January 2023 for AWV.  Recent consult visits:  None since last CCM contact  Hospital visits:  None in previous 6 months  Medications: Outpatient Encounter Medications as of 03/31/2021  Medication Sig   acetaminophen (TYLENOL) 500 MG tablet Take 500 mg by mouth every 6 (six) hours as needed (FOR PAIN.).    albuterol (VENTOLIN HFA) 108 (90 Base) MCG/ACT inhaler Inhale 2 puffs into the lungs every 4 (four) hours as needed for wheezing.   amLODipine (NORVASC) 10 MG tablet Take 1 tablet (10 mg total) by mouth daily.   Bacillus Coagulans-Inulin (PROBIOTIC) 1-250 BILLION-MG CAPS Take by mouth.   benazepril (LOTENSIN) 20 MG tablet Take 1 tablet (20 mg total) by mouth daily.   Cholecalciferol 25 MCG (1000 UT) capsule Take 1,000 Units by mouth.   citalopram (CELEXA) 40 MG tablet Take 1 tablet (40 mg total) by mouth at bedtime.   cyclobenzaprine (FLEXERIL) 10 MG tablet TAKE 1/2 TO 1 PILL UP TO 3 TIMES DAILY AS NEEDED FOR HEADACHE OR MUSCLE SPASM, CAUTION OF SEDATION   dicyclomine (BENTYL) 20 MG tablet Take 1 tablet (20 mg total) by mouth 2 (two) times daily before a meal.   diphenhydrAMINE-zinc acetate (BENADRYL ITCH STOPPING) cream Apply 1 application topically daily as needed for itching.   DM-APAP-CPM (CORICIDIN HBP) 10-325-2 MG TABS Take by mouth.   fexofenadine (ALLEGRA) 180 MG tablet Take 180 mg by mouth daily.   furosemide (LASIX) 20 MG tablet Take 1 tablet (20 mg total) by mouth daily.   glucose blood test strip ONE  TOUCH:  Check blood sugar once a day. Dx E11.9   hydrocortisone cream 1 % Apply 1 application topically 2 (two) times daily.   Iron-Vitamin C (VITRON-C PO) Take 1 capsule by mouth daily.   Ketotifen Fumarate (ALAWAY OP) Place 1-2 drops into both eyes 3 (three) times daily as needed (for eye irritation.).    Lancets (ONETOUCH ULTRASOFT) lancets CHECK BLOOD GLUCOSE ONCE DAILY AND AS DIRECTED FOR DM 250.0   loperamide (IMODIUM) 2 MG capsule Take 2-4 mg by mouth 4 (four) times daily as needed for diarrhea or loose stools.   meclizine (ANTIVERT) 25 MG tablet Take 25 mg by mouth 3 (three) times daily as needed (for dizziness/vertigo).   neomycin-bacitracin-polymyxin (NEOSPORIN) ointment Apply 1 application topically every 12 (twelve) hours.   omeprazole (PRILOSEC) 20 MG capsule Take 1 capsule (20 mg total) by mouth daily.   ONETOUCH DELICA LANCETS FINE MISC 1 each by Other route as directed. CHECK BLOOD GLUCOSE ONCE DAILY AND AS DIRECTED FOR DIABETES MELLITIS   rosuvastatin (CRESTOR) 10 MG tablet TAKE 1/2 TABLET BY MOUTH DAILY   sodium chloride (MURO 128) 5 % ophthalmic solution Place 1 drop into both eyes 4 (four) times daily as needed for eye irritation.    Triamcinolone Acetonide (NASACORT ALLERGY 24HR NA) Place 1 spray into the nose daily as needed (for allergies.).    triamcinolone cream (KENALOG) 0.1 % Apply 1 application topically 2 (two) times daily.   vitamin B-12 (CYANOCOBALAMIN) 1000 MCG tablet Take 1,000 mcg by mouth at bedtime.  No facility-administered encounter medications on file as of 03/31/2021.     Recent Office Vitals: BP Readings from Last 3 Encounters:  03/21/21 (!) 145/70  01/31/21 (!) 146/73  09/16/20 136/68   Pulse Readings from Last 3 Encounters:  03/21/21 75  01/31/21 68  09/16/20 65    Wt Readings from Last 3 Encounters:  03/21/21 197 lb 8 oz (89.6 kg)  01/31/21 197 lb (89.4 kg)  09/16/20 202 lb 9 oz (91.9 kg)     Kidney Function Lab Results  Component  Value Date/Time   CREATININE 1.23 (H) 03/21/2021 10:54 AM   CREATININE 1.37 (H) 01/24/2021 10:47 AM   GFR 42.01 (L) 03/21/2021 10:54 AM   GFRNONAA 40 (L) 01/24/2021 10:47 AM   GFRAA 46 (L) 01/17/2020 09:46 AM    BMP Latest Ref Rng & Units 03/21/2021 01/24/2021 07/30/2020  Glucose 70 - 99 mg/dL 88 89 89  BUN 6 - 23 mg/dL 25(H) 31(H) 28(H)  Creatinine 0.40 - 1.20 mg/dL 1.23(H) 1.37(H) 1.40(H)  Sodium 135 - 145 mEq/L 140 137 139  Potassium 3.5 - 5.1 mEq/L 4.7 4.5 4.4  Chloride 96 - 112 mEq/L 105 104 103  CO2 19 - 32 mEq/L 28 25 26   Calcium 8.4 - 10.5 mg/dL 9.1 8.9 8.7(L)   Attempted contact with Lodema Hong 3 times on 03/31/21,04/02/21,04/04/21. Unsuccessful outreach. Will attempt contact next month.   Current antihypertensive regimen:   Amlodipine 10 mg daily Benazepril 20 mg daily Furosemide 20 mg daily    Star Rating Drugs:  Medication:  Last Fill: Day Supply Benazepril 20mg  03/21/21 90 Rosuvastatin 10mg  03/17/21 90    Care Gaps: Annual wellness visit in last year? No Most Recent BP reading:145/70  75-P  03/21/21    05/07/21- Oncology   Mendel Ryder [place] CPP notified  Avel Sensor, [place] Assistant [phone]  Total time spent for month CPA: 0 min.

## 2021-04-23 ENCOUNTER — Telehealth: Payer: Self-pay

## 2021-04-23 MED ORDER — GLUCOSE BLOOD VI STRP: ORAL_STRIP | 1 refills | Status: DC

## 2021-04-23 NOTE — Progress Notes (Signed)
Chronic Care Management Pharmacy Assistant   Name: SHAKEA ISIP  MRN: [id number] DOB: 1940-06-20  Reason for Encounter: CCM (Appointment Reminder)  Medications: Outpatient Encounter Medications as of 04/23/2021  Medication Sig   acetaminophen (TYLENOL) 500 MG tablet Take 500 mg by mouth every 6 (six) hours as needed (FOR PAIN.).    albuterol (VENTOLIN HFA) 108 (90 Base) MCG/ACT inhaler Inhale 2 puffs into the lungs every 4 (four) hours as needed for wheezing.   amLODipine (NORVASC) 10 MG tablet Take 1 tablet (10 mg total) by mouth daily.   Bacillus Coagulans-Inulin (PROBIOTIC) 1-250 BILLION-MG CAPS Take by mouth.   benazepril (LOTENSIN) 20 MG tablet Take 1 tablet (20 mg total) by mouth daily.   Cholecalciferol 25 MCG (1000 UT) capsule Take 1,000 Units by mouth.   citalopram (CELEXA) 40 MG tablet Take 1 tablet (40 mg total) by mouth at bedtime.   cyclobenzaprine (FLEXERIL) 10 MG tablet TAKE 1/2 TO 1 PILL UP TO 3 TIMES DAILY AS NEEDED FOR HEADACHE OR MUSCLE SPASM, CAUTION OF SEDATION   dicyclomine (BENTYL) 20 MG tablet Take 1 tablet (20 mg total) by mouth 2 (two) times daily before a meal.   diphenhydrAMINE-zinc acetate (BENADRYL ITCH STOPPING) cream Apply 1 application topically daily as needed for itching.   DM-APAP-CPM (CORICIDIN HBP) 10-325-2 MG TABS Take by mouth.   fexofenadine (ALLEGRA) 180 MG tablet Take 180 mg by mouth daily.   furosemide (LASIX) 20 MG tablet Take 1 tablet (20 mg total) by mouth daily.   glucose blood test strip ONE TOUCH:  Check blood sugar once a day. Dx E11.9   hydrocortisone cream 1 % Apply 1 application topically 2 (two) times daily.   Iron-Vitamin C (VITRON-C PO) Take 1 capsule by mouth daily.   Ketotifen Fumarate (ALAWAY OP) Place 1-2 drops into both eyes 3 (three) times daily as needed (for eye irritation.).    Lancets (ONETOUCH ULTRASOFT) lancets CHECK BLOOD GLUCOSE ONCE DAILY AND AS DIRECTED FOR DM 250.0   loperamide (IMODIUM) 2 MG capsule Take 2-4  mg by mouth 4 (four) times daily as needed for diarrhea or loose stools.   meclizine (ANTIVERT) 25 MG tablet Take 25 mg by mouth 3 (three) times daily as needed (for dizziness/vertigo).   neomycin-bacitracin-polymyxin (NEOSPORIN) ointment Apply 1 application topically every 12 (twelve) hours.   omeprazole (PRILOSEC) 20 MG capsule Take 1 capsule (20 mg total) by mouth daily.   ONETOUCH DELICA LANCETS FINE MISC 1 each by Other route as directed. CHECK BLOOD GLUCOSE ONCE DAILY AND AS DIRECTED FOR DIABETES MELLITIS   rosuvastatin (CRESTOR) 10 MG tablet TAKE 1/2 TABLET BY MOUTH DAILY   sodium chloride (MURO 128) 5 % ophthalmic solution Place 1 drop into both eyes 4 (four) times daily as needed for eye irritation.    Triamcinolone Acetonide (NASACORT ALLERGY 24HR NA) Place 1 spray into the nose daily as needed (for allergies.).    triamcinolone cream (KENALOG) 0.1 % Apply 1 application topically 2 (two) times daily.   vitamin B-12 (CYANOCOBALAMIN) 1000 MCG tablet Take 1,000 mcg by mouth at bedtime.   No facility-administered encounter medications on file as of 04/23/2021.   SHAGUANA LOVE was contacted to remind her of her upcoming telephone visit with Charlene Brooke on 04/28/2021 at 3:30 pm. Patient was reminded to have all medications, supplements and any blood glucose and blood pressure readings available for review at appointment.   Spoke with patient; she stated the wrong test strips were called in for her  last time and she does not have any left. Patient stated she would pick them up today and take her blood sugar daily from today until the call with Mendel Ryder where patient will report readings. Patient is using GE 100 Glucose Meter and needs the GE 100 test strips as well. Patient would like that called in to [facility] [address], [place] [address]. Phone:  [phone]  Fax:  [phone]   I also asked patient to take and record her blood pressure reading from today until  the call with Mendel Ryder. Patient stated she does not know how accurate her blood pressure cuff is. I advised patient to go ahead and log her readings. Advised next time she goes in to the office for an appointment to bring the cuff with her to check the accuracy. Patient voiced understanding and agreed.   Phone:  [phone]  Fax:  [phone]   Are you having any problems with your medications? Yes  Do you have any concerns you like to discuss with the pharmacist? No  Star Rating Drugs: Medication:  Last Fill: Day Supply Benazepril 20mg          03/21/2021 90 Rosuvastatin 10mg      03/17/2021 [place], CPP notified  Marijean Niemann, [place]  Time Spent:  13 Minutes

## 2021-04-23 NOTE — Addendum Note (Signed)
Addended by: Charlton Haws on: 04/23/2021 10:53 AM   Modules accepted: Orders

## 2021-04-23 NOTE — Telephone Encounter (Signed)
Refilled GE100 test strips per patient request.

## 2021-04-28 ENCOUNTER — Other Ambulatory Visit: Payer: Self-pay

## 2021-04-28 ENCOUNTER — Ambulatory Visit (INDEPENDENT_AMBULATORY_CARE_PROVIDER_SITE_OTHER): Payer: Medicare Other | Admitting: Pharmacist

## 2021-04-28 DIAGNOSIS — I10 Essential (primary) hypertension: Secondary | ICD-10-CM

## 2021-04-28 DIAGNOSIS — J301 Allergic rhinitis due to pollen: Secondary | ICD-10-CM

## 2021-04-28 DIAGNOSIS — E7800 Pure hypercholesterolemia, unspecified: Secondary | ICD-10-CM

## 2021-04-28 DIAGNOSIS — N1832 Chronic kidney disease, stage 3b: Secondary | ICD-10-CM

## 2021-04-28 DIAGNOSIS — G43109 Migraine with aura, not intractable, without status migrainosus: Secondary | ICD-10-CM

## 2021-04-28 NOTE — Progress Notes (Signed)
Chronic Care Management Pharmacy Note  05/02/2021 Name:  Ann Cummings MRN:  [id number] DOB:  1941-04-26  Summary: -BP is elevated on home monitor, however suspect monitor is overestimating readings -Migraine frequency has improved over last few months (previously daily, now once a week) without intervention; previously discussed possibility of trying Emgality (CGRP-antagonist), pt will consider this if migraines get worse again  Recommendations/Changes made from today's visit: -Advised pt to get a new BP monitor -Consider Emgality if migraines worsen  Plan: -[place] will call patient 1 month for BP check -Pharmacist follow up televisit scheduled for 2 months   Subjective: Ann Cummings is an 80 y.o. year old female who is a primary patient of Tower, [name], MD.  The CCM team was consulted for assistance with disease management and care coordination needs.    Engaged with patient by telephone for follow up visit in response to provider referral for pharmacy case management and/or care coordination services.   Consent to Services:  The patient was given information about Chronic Care Management services, agreed to services, and gave verbal consent prior to initiation of services.  Please see initial visit note for detailed documentation.   Patient Care Team: Tower, [name], MD as PCP - General Ninfa Linden Lind Guest, MD as Consulting Physician (Orthopedic Surgery) Earnie Larsson, MD as Consulting Physician (Neurosurgery) Oneta Rack, MD as Consulting Physician (Dermatology) Earlie Server, MD as Consulting Physician (Hematology and Oncology) Charlton Haws, [facility] as Pharmacist (Pharmacist)   Patient came with her daughter [name] today. Patient has lived in [place] for 27 years. She is retired and spends most of her time at home watching TV. She does not drive due to migraines.  Recent office visits: 03/21/21 Dr Glori Bickers OV: fatigue/brain fog; recheck BP when less  anxious; her home cuff runs high, stop using it. Discuss migraines at f/u;   Recent consult visits: 07/30/20 Dr. Edythe Clarity, Oncology; f/u breast cancer; ordered labs.   Hospital visits: None in previous 6 months   Objective:  Lab Results  Component Value Date   CREATININE 1.23 (H) 03/21/2021   BUN 25 (H) 03/21/2021   GFR 42.01 (L) 03/21/2021   GFRNONAA 40 (L) 01/24/2021   GFRAA 46 (L) 01/17/2020   NA 140 03/21/2021   K 4.7 03/21/2021   CALCIUM 9.1 03/21/2021   CO2 28 03/21/2021   GLUCOSE 88 03/21/2021    Lab Results  Component Value Date/Time   HGBA1C 6.0 03/21/2021 10:54 AM   HGBA1C 6.1 02/27/2020 11:14 AM   GFR 42.01 (L) 03/21/2021 10:54 AM   GFR 38.71 (L) 02/23/2019 08:59 AM   MICROALBUR 1.6 04/24/2009 08:42 AM   MICROALBUR 9.6 (H) 04/17/2008 10:08 AM    Last diabetic Eye exam:  Lab Results  Component Value Date/Time   HMDIABEYEEXA Retinopathy (A) 05/07/2020 12:00 AM    Last diabetic Foot exam:  Lab Results  Component Value Date/Time   HMDIABFOOTEX yes 08/21/2009 12:00 AM     Lab Results  Component Value Date   CHOL 134 03/21/2021   HDL 39.40 03/21/2021   LDLCALC 61 01/15/2015   LDLDIRECT 61.0 03/21/2021   TRIG 205.0 (H) 03/21/2021   CHOLHDL 3 03/21/2021    Hepatic Function Latest Ref Rng & Units 03/21/2021 01/24/2021 07/30/2020  Total Protein 6.0 - 8.3 g/dL 6.3 6.7 6.6  Albumin 3.5 - 5.2 g/dL 4.1 3.8 3.8  AST 0 - 37 U/L 12 13(L) 13(L)  ALT 0 - 35 U/L 8 11 10  Alk Phosphatase 39 - 117 U/L 41 46 41  Total Bilirubin 0.2 - 1.2 mg/dL 0.4 0.2(L) 0.5  Bilirubin, Direct 0.0 - 0.3 mg/dL - - -    Lab Results  Component Value Date/Time   TSH 1.32 03/21/2021 10:54 AM   TSH 1.78 02/27/2020 11:14 AM    CBC Latest Ref Rng & Units 03/21/2021 01/24/2021 07/30/2020  WBC 4.0 - 10.5 K/uL 11.5(H) 9.2 11.1(H)  Hemoglobin 12.0 - 15.0 g/dL 10.7(L) 10.8(L) 10.9(L)  Hematocrit 36.0 - 46.0 % 32.3(L) 32.9(L) 32.3(L)  Platelets 150.0 - 400.0 K/uL 327.0 307 327    Lab Results   Component Value Date/Time   VD25OH 21 (L) 05/16/2009 10:15 PM    Clinical ASCVD: No  The 10-year ASCVD risk score (Arnett DK, et al., 2019) is: 55%   Values used to calculate the score:     Age: 80 years     Sex: Female     Is Non-Hispanic African American: No     Diabetic: Yes     Tobacco smoker: No     Systolic Blood Pressure: 443 mmHg     Is BP treated: Yes     HDL Cholesterol: 39.4 mg/dL     Total Cholesterol: 134 mg/dL    Depression screen [facility] 2/9 02/27/2020 02/28/2019 02/17/2018  Decreased Interest 1 0 1  Down, Depressed, Hopeless 0 0 1  PHQ - 2 Score 1 0 2  Altered sleeping 1 - 0  Tired, decreased energy 2 - 0  Change in appetite 1 - 0  Feeling bad or failure about yourself  0 - 0  Trouble concentrating 0 - 0  Moving slowly or fidgety/restless 0 - 0  Suicidal thoughts 0 - 0  PHQ-9 Score 5 - 2  Difficult doing work/chores Not difficult at all - Not difficult at all  Some recent data might be hidden    Social History   Tobacco Use  Smoking Status Never  Smokeless Tobacco Never   BP Readings from Last 3 Encounters:  03/21/21 (!) 145/70  01/31/21 (!) 146/73  09/16/20 136/68   Pulse Readings from Last 3 Encounters:  03/21/21 75  01/31/21 68  09/16/20 65   Wt Readings from Last 3 Encounters:  03/21/21 197 lb 8 oz (89.6 kg)  01/31/21 197 lb (89.4 kg)  09/16/20 202 lb 9 oz (91.9 kg)   BMI Readings from Last 3 Encounters:  03/21/21 34.44 kg/m  01/31/21 34.90 kg/m  09/16/20 35.88 kg/m    Assessment/Interventions: Review of patient past medical history, allergies, medications, health status, including review of consultants reports, laboratory and other test data, was performed as part of comprehensive evaluation and provision of chronic care management services.   SDOH:  (Social Determinants of Health) assessments and interventions performed: Yes  SDOH Screenings   Alcohol Screen: Not on file  Depression (PHQ2-9): Not on file  Financial Resource  Strain: Not on file  Food Insecurity: Not on file  Housing: Not on file  Physical Activity: Not on file  Social Connections: Not on file  Stress: Not on file  Tobacco Use: Low Risk    Smoking Tobacco Use: Never   Smokeless Tobacco Use: Never   Passive Exposure: Not on file  Transportation Needs: Not on file    CCM Care Plan  Allergies  Allergen Reactions   Calcium     REACTION: severe constipation   Cetirizine Hcl     REACTION: reaction not known   Codeine     Per pt  elevated blood pressure and caused haedache   Gabapentin     REACTION: Confussion   Mometasone Furoate     REACTION: not effective   Prednisone Diarrhea    Stomach swollen and IBS   Ropinirole Hydrochloride     REACTION: lightheaded and felt like going to pass out   Ampicillin Rash    Rash all over body    Medications Reviewed Today     Reviewed by Tammi Sou, CMA (Certified Medical Assistant) on 03/21/21 at 74  Med List Status: <None>   Medication Order Taking? Sig Documenting Provider Last Dose Status Informant  acetaminophen (TYLENOL) 500 MG tablet [id number] Yes Take 500 mg by mouth every 6 (six) hours as needed (FOR PAIN.).  [provider] Taking Active Self  albuterol (VENTOLIN HFA) 108 (90 Base) MCG/ACT inhaler [id number] Yes Inhale 2 puffs into the lungs every 4 (four) hours as needed for wheezing. Tower, [name], MD Taking Active   amLODipine (NORVASC) 10 MG tablet [id number] Yes Take 1 tablet (10 mg total) by mouth daily. Tower, [name], MD Taking Active   Bacillus Coagulans-Inulin (PROBIOTIC) 1-250 BILLION-MG CAPS [id number] Yes Take by mouth. [provider] Taking Active   benazepril (LOTENSIN) 20 MG tablet [id number] Yes Take 1 tablet (20 mg total) by mouth daily. Tower, [name], MD Taking Active   Cholecalciferol 25 MCG (1000 UT) capsule [id number] Yes Take 1,000 Units by mouth. [provider] Taking Active   citalopram (CELEXA) 40 MG tablet [id number] Yes  Take 1 tablet (40 mg total) by mouth at bedtime. Tower, [name], MD Taking Active   cyclobenzaprine (FLEXERIL) 10 MG tablet [id number] Yes TAKE 1/2 TO 1 PILL UP TO 3 TIMES DAILY AS NEEDED FOR HEADACHE OR MUSCLE SPASM, CAUTION OF SEDATION Tower, [name], MD Taking Active   dicyclomine (BENTYL) 20 MG tablet [id number] Yes TAKE 1 TABLET (20 MG TOTAL) BY MOUTH 2 (TWO) TIMES DAILY BEFORE A MEAL. Tower, [name], MD Taking Active   diphenhydrAMINE-zinc acetate (BENADRYL ITCH STOPPING) cream [id number] Yes Apply 1 application topically daily as needed for itching. [provider] Taking Active   DM-APAP-CPM (CORICIDIN HBP) 10-325-2 MG TABS [id number] Yes Take by mouth. [provider] Taking Active   fexofenadine (ALLEGRA) 180 MG tablet [id number] Yes Take 180 mg by mouth daily. [provider] Taking Active   furosemide (LASIX) 20 MG tablet [id number] Yes Take 1 tablet (20 mg total) by mouth daily. Tower, [name], MD Taking Active   glucose blood test strip [id number] Yes ONE TOUCH:  Check blood sugar once a day. Dx E11.9 Tower, [name], MD Taking Active   hydrocortisone cream 1 % [id number] Yes Apply 1 application topically 2 (two) times daily. [provider] Taking Active   Iron-Vitamin C (VITRON-C PO) [id number] Yes Take 1 capsule by mouth daily. [provider] Taking Active   Ketotifen Fumarate (ALAWAY OP) [id number] Yes Place 1-2 drops into both eyes 3 (three) times daily as needed (for eye irritation.).  [provider] Taking Active Self  Lancets Glory Rosebush ULTRASOFT) lancets [id number] Yes CHECK BLOOD GLUCOSE ONCE DAILY AND AS DIRECTED FOR DM 250.0 Tower, [name], MD Taking Active Self  loperamide (IMODIUM) 2 MG capsule [id number] Yes Take 2-4 mg by mouth 4 (four) times daily as needed for diarrhea or loose stools. [provider] Taking Active Self  meclizine (ANTIVERT) 25 MG tablet [id number] Yes Take 25 mg by mouth 3 (three) times daily as needed (for  dizziness/vertigo). [provider] Taking Active Self  neomycin-bacitracin-polymyxin (NEOSPORIN) ointment [id number] Yes Apply 1 application topically every 12 (twelve) hours. [provider] Taking Active   omeprazole (PRILOSEC) 20 MG capsule [id number] Yes Take 1 capsule (20 mg total) by mouth daily. Tower, [name], MD Taking Active   [facility] LANCETS FINE [place] [id number] Yes 1 each by Other route as directed. CHECK BLOOD GLUCOSE ONCE DAILY AND AS DIRECTED FOR DIABETES MELLITIS Tower, [name], MD Taking Active Self  rosuvastatin (CRESTOR) 10 MG tablet [id number] Yes TAKE 1/2 TABLET BY MOUTH DAILY Tower, [name], MD Taking Active   sodium chloride (MURO 128) 5 % ophthalmic solution [id number] Yes Place 1 drop into both eyes 4 (four) times daily as needed for eye irritation.  [provider] Taking Active Self  Triamcinolone Acetonide (NASACORT ALLERGY 24HR NA) [id number] Yes Place 1 spray into the nose daily as needed (for allergies.).  [provider] Taking Active Self           Med Note Tamala Julian, Sharlette Dense   Wed Apr 05, 2019  1:04 PM)    vitamin B-12 (CYANOCOBALAMIN) 1000 MCG tablet [id number] Yes Take 1,000 mcg by mouth at bedtime. [provider] Taking Active Self            Patient Active Problem List   Diagnosis Date Noted   Current use of proton pump inhibitor 03/21/2021   Macular degeneration, wet ([place]) 09/16/2020   Anemia due to stage 3a chronic kidney disease ([place]) 07/30/2020   Chronic cough 02/27/2020   Dysphagia 02/27/2020   Migraine with aura 02/22/2018   Goals of care, counseling/discussion 10/14/2017   Malignant neoplasm of upper-outer quadrant of left breast in female, estrogen receptor positive ([place]) 08/02/2017   Intertrigo 02/19/2016   Class 2 obesity due to excess calories with body mass index (BMI) of 35.0 to 35.9 in adult 08/02/2015   Encounter for Medicare annual wellness exam 01/09/2014   Chronic kidney disease, stage  3b ([place]) 01/09/2014   Eczema 07/12/2013   Pedal edema 01/06/2013   Allergic rhinitis 08/21/2009   Iron deficiency anemia 07/09/2009   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   History of TV adenoma of colon 06/07/2009   OBSTRUCTIVE SLEEP APNEA 05/31/2009   ANEMIA, B12 DEFICIENCY 05/24/2009   Depression 05/15/2009   MEMORY LOSS 04/30/2009   Prediabetes 10/26/2007   LIPOMA, BACK 10/01/2006   Hyperlipidemia 10/01/2006   CARPAL TUNNEL SYNDROME 10/01/2006   Essential hypertension 10/01/2006   IBS 10/01/2006   FIBROCYSTIC BREAST DISEASE 10/01/2006   OSTEOARTHRITIS 10/01/2006   SPINAL STENOSIS 10/01/2006   BACK PAIN, CHRONIC 10/01/2006   MIGRAINES, HX OF 10/01/2006    Immunization History  Administered Date(s) Administered   Fluad Quad(high Dose 65+) 02/28/2019, 02/27/2020   Influenza Whole 02/25/2005, 04/30/2009, 03/17/2010   Influenza,inj,Quad PF,6+ Mos 02/10/2013, 01/09/2014, 02/01/2015, 02/03/2016, 02/10/2017, 03/03/2018, 03/21/2021   PFIZER(Purple Top)SARS-COV-2 Vaccination 11/02/2019, 11/23/2019   Pneumococcal Conjugate-13 01/09/2014   Pneumococcal Polysaccharide-23 04/23/2008   Td 04/13/1995, 10/14/2006    Conditions to be addressed/monitored:  Hypertension, Hyperlipidemia, GERD, Chronic Kidney Disease, Depression, and Allergic Rhinitis  Care Plan : [place]  Updates made by Charlton Haws, [place] since 05/02/2021 12:00 AM     Problem: Hypertension, Hyperlipidemia, GERD, Chronic Kidney Disease, Depression, and Allergic Rhinitis   Priority: High     Long-Range Goal: Disease management   Start Date: 01/03/2021  Expected End Date: 01/03/2022  This Visit's Progress: On track  Recent Progress: On track  Priority:  High  Note:   Current Barriers:  Unable to independently monitor therapeutic efficacy Suboptimal therapeutic regimen for HTN, migraines  Pharmacist Clinical Goal(s):  Patient will achieve adherence to monitoring guidelines and medication  adherence to achieve therapeutic efficacy adhere to plan to optimize therapeutic regimen for HTN, migraines, eczema as evidenced by report of adherence to recommended medication management changes through collaboration with PharmD and provider.   Interventions: 1:1 collaboration with Tower, [name], MD regarding development and update of comprehensive plan of care as evidenced by provider attestation and co-signature Inter-disciplinary care team collaboration (see longitudinal plan of care) Comprehensive medication review performed; medication list updated in electronic medical record  Hypertension / CKD stage 3b (BP goal <130/80) -Not ideally controlled - pt reports home readings are higher than office readings, she reports she is taking furosemide for swelling, she denies swelling on daily basis, denies hx of heart failure; she does report several sisters have issues with lymphedema -Current home readings: 174/77, 158/69 -Current treatment: Amlodipine 10 mg daily AM Benazepril 20 mg daily AM Furosemide 20 mg daily -Medications previously tried: atenolol, triamterene/htctz -Denies hypotensive/hypertensive symptoms -Educated on BP goals and benefits of medications for prevention of heart attack, stroke and kidney damage; Importance of home blood pressure monitoring; Proper BP monitoring technique; -Counseled to monitor BP at home daily - advised to get Omron monitor for accurate readings -Discussed swelling at length - possible causes, contributing factors including salt intake, kidney impariment, and amlodipine use; pt may benefit from reducing or d/c amlodipine and/or trial off furosemide; first we need home BP readings to determine baseline average -Plan: pt will get a new BP monitor and start checking BP at home -May consider reducing amlodipine to help with swelling, depending on home BP readings. F/U call in 1 month for home readings.  Hyperlipidemia: (LDL goal < 100) -Controlled - LDL  is at goal; pt endorses compliance with statin and denies side effects; Triglycerides were elevated at last check, unknown if fasting lab or not -Current treatment: Rosuvastatin 10 mg - 1/2 tab daily HS -Educated on Cholesterol goals; Benefits of statin for ASCVD risk reduction; non-fasting can lead to falsely elevated triglyceridies -Counseled on benefits of OTC fish oil for triglycerides; pt would like to avoid adding more supplements, so advised to increase fish in diet -Recommended to continue current medication  IBS / GERD (Goal: manage symptoms) -Controlled - pt reports using loperamide before she leaves the house "just in case"; she has seen GI specialist previously and has been told to forego further colonoscopies; she denies issues with GERD -Current treatment  Dicyclomine 20 mg BID Loperamide 2 mg PRN Simethicone PRN Omeprazole 20 mg daily Probiotic -Recommended to continue current medication  Allergic rhinitis (Goal: manage symptoms) -Not ideally controlled - pt is off of Benadryl after discussion at last visit; she is using chlorpheniramine instead -Current treatment  Nasacort nasal spray PRN Albuterol HFA prn Ketotifen (Alaway) eye drops Systane eye drops Coricidin Chlorpheniramine -Discussed chlorpheniramine is also a first generation antihistamine like benadryl; is has comparatively less anticholinergic effects but still can cause issues; advised to use 2nd gen antihistamine instead (Zyrtec, Allegra, Claritin)  Migraine headache (Goal: manage symptoms) -Improving - previously pt reports near daily headaches;  she reports these have improved over last month or two; her migraines effect her motor skills/balance -Current treatment  Cyclobenzaprine 10 mg PRN Tylenol 500 mg PRN -Medications previously tried: tramadol, gabapentin, atenolol -Educated on migraine prevention options - beta blockers, amitriptyline, topiramate are first line  option; pt has not tolerated atenolol  due to low pulse; would avoid amitriptyline due to anticholinergic properties and age; would avoid topiramate due to potential to worsen cognitive issues; CGRP antagonists would be a good option -Can consider CGRP-antagonist Tree surgeon) for migraine prevention if desired  Health Maintenance -Vaccine gaps: Shingrix, covid booster -Counseled on Shingrix and covid booster; pt will think about getting them at local pharmacy  Patient Goals/Self-Care Activities Patient will:  - take medications as prescribed focus on medication adherence by routine check blood pressure daily, document, and provide at future appointments -Get a new BP monitor (Omron brand is a good one) -Try Benadryl cream for rash/itching -Avoid Benadryl tablets. Take Allegra instead -Avoid Sudafed or PE decongestant product; try Coricidin HBP instead -Let PCP know if you want to pursue injections to prevent migraines      Medication Assistance: None required.  Patient affirms current coverage meets needs.  Compliance/Adherence/Medication fill history: Care Gaps: Hep C screening Colonoscopy (due 04/24/19) - GI doctor has advised against further colonoscopies  Star-Rating Drugs: Medication:                Last Fill:         Day Supply Benazepril 49m         03/21/2021     90 PDC 91% Rosuvastatin 148m    03/17/2021           90 PDC 100%  [facility] [facility]  [place], [id number]: [phone]: [id number] [place] [place]: [phone]: [phone] pill box? No - prefers bottles Pt endorses 100% compliance  We discussed: Current pharmacy is preferred with insurance plan and patient is satisfied with pharmacy services Patient decided to: Continue current medication management strategy  Care Plan  and Follow Up Patient Decision:  Patient agrees to Care Plan and Follow-up.  Plan: Telephone follow up appointment with care management team member scheduled for:  2 month  LinCharlene BrookeharmD, BCAJohn & Mary Kirby Hospitalinical Pharmacist [facility]aint [facility] Hospitalimary Care [phone]

## 2021-04-30 ENCOUNTER — Ambulatory Visit: Payer: Medicare Other | Admitting: Radiation Oncology

## 2021-05-02 NOTE — Patient Instructions (Signed)
Visit Information  Phone number for Pharmacist: [phone]   Goals Addressed             This Visit's Progress    Track and Manage My Blood Pressure-Hypertension       Timeframe:  Long-Range Goal Priority:  High Start Date:    01/03/21                         Expected End Date:  05/16/21                     Follow Up Date Feb 2023   - check blood pressure 3 times per week - choose a place to take my blood pressure (home, clinic or office, retail store) - write blood pressure results in a log or diary -Get a new BP monitor (Omron brand is accurate)    Why is this important?   You won't feel high blood pressure, but it can still hurt your blood vessels.  High blood pressure can cause heart or kidney problems. It can also cause a stroke.  Making lifestyle changes like losing a little weight or eating less salt will help.  Checking your blood pressure at home and at different times of the day can help to control blood pressure.  If the doctor prescribes medicine remember to take it the way the doctor ordered.  Call the office if you cannot afford the medicine or if there are questions about it.     Notes:         Care Plan : [place]  Updates made by Charlton Haws, RPH since 05/02/2021 12:00 AM     Problem: Hypertension, Hyperlipidemia, GERD, Chronic Kidney Disease, Depression, and Allergic Rhinitis   Priority: High     Long-Range Goal: Disease management   Start Date: 01/03/2021  Expected End Date: 01/03/2022  This Visit's Progress: On track  Recent Progress: On track  Priority: High  Note:   Current Barriers:  Unable to independently monitor therapeutic efficacy Suboptimal therapeutic regimen for HTN, migraines  Pharmacist Clinical Goal(s):  Patient will achieve adherence to monitoring guidelines and medication adherence to achieve therapeutic efficacy adhere to plan to optimize therapeutic regimen for HTN, migraines, eczema as evidenced by  report of adherence to recommended medication management changes through collaboration with PharmD and provider.   Interventions: 1:1 collaboration with Tower, [name], MD regarding development and update of comprehensive plan of care as evidenced by provider attestation and co-signature Inter-disciplinary care team collaboration (see longitudinal plan of care) Comprehensive medication review performed; medication list updated in electronic medical record  Hypertension / CKD stage 3b (BP goal <130/80) -Not ideally controlled - pt reports home readings are higher than office readings, she reports she is taking furosemide for swelling, she denies swelling on daily basis, denies hx of heart failure; she does report several sisters have issues with lymphedema -Current home readings: 174/77, 158/69 -Current treatment: Amlodipine 10 mg daily AM Benazepril 20 mg daily AM Furosemide 20 mg daily -Medications previously tried: atenolol, triamterene/htctz -Denies hypotensive/hypertensive symptoms -Educated on BP goals and benefits of medications for prevention of heart attack, stroke and kidney damage; Importance of home blood pressure monitoring; Proper BP monitoring technique; -Counseled to monitor BP at home daily - advised to get Omron monitor for accurate readings -Discussed swelling at length - possible causes, contributing factors including salt intake, kidney impariment, and amlodipine use; pt may benefit from reducing or  d/c amlodipine and/or trial off furosemide; first we need home BP readings to determine baseline average -Plan: pt will get a new BP monitor and start checking BP at home -May consider reducing amlodipine to help with swelling, depending on home BP readings. F/U call in 1 month for home readings.  Hyperlipidemia: (LDL goal < 100) -Controlled - LDL is at goal; pt endorses compliance with statin and denies side effects; Triglycerides were elevated at last check, unknown if fasting  lab or not -Current treatment: Rosuvastatin 10 mg - 1/2 tab daily HS -Educated on Cholesterol goals; Benefits of statin for ASCVD risk reduction; non-fasting can lead to falsely elevated triglyceridies -Counseled on benefits of OTC fish oil for triglycerides; pt would like to avoid adding more supplements, so advised to increase fish in diet -Recommended to continue current medication  IBS / GERD (Goal: manage symptoms) -Controlled - pt reports using loperamide before she leaves the house "just in case"; she has seen GI specialist previously and has been told to forego further colonoscopies; she denies issues with GERD -Current treatment  Dicyclomine 20 mg BID Loperamide 2 mg PRN Simethicone PRN Omeprazole 20 mg daily Probiotic -Recommended to continue current medication  Allergic rhinitis (Goal: manage symptoms) -Not ideally controlled - pt is off of Benadryl after discussion at last visit; she is using chlorpheniramine instead -Current treatment  Nasacort nasal spray PRN Albuterol HFA prn Ketotifen (Alaway) eye drops Systane eye drops Coricidin Chlorpheniramine -Discussed chlorpheniramine is also a first generation antihistamine like benadryl; is has comparatively less anticholinergic effects but still can cause issues; advised to use 2nd gen antihistamine instead (Zyrtec, Allegra, Claritin)  Migraine headache (Goal: manage symptoms) -Improving - previously pt reports near daily headaches;  she reports these have improved over last month or two; her migraines effect her motor skills/balance -Current treatment  Cyclobenzaprine 10 mg PRN Tylenol 500 mg PRN -Medications previously tried: tramadol, gabapentin, atenolol -Educated on migraine prevention options - beta blockers, amitriptyline, topiramate are first line option; pt has not tolerated atenolol due to low pulse; would avoid amitriptyline due to anticholinergic properties and age; would avoid topiramate due to potential to  worsen cognitive issues; CGRP antagonists would be a good option -Can consider CGRP-antagonist Tree surgeon) for migraine prevention if desired  Health Maintenance -Vaccine gaps: Shingrix, covid booster -Counseled on Shingrix and covid booster; pt will think about getting them at local pharmacy  Patient Goals/Self-Care Activities Patient will:  - take medications as prescribed focus on medication adherence by routine check blood pressure daily, document, and provide at future appointments -Get a new BP monitor (Omron brand is a good one) -Try Benadryl cream for rash/itching -Avoid Benadryl tablets. Take Allegra instead -Avoid Sudafed or PE decongestant product; try Coricidin HBP instead -Let PCP know if you want to pursue injections to prevent migraines      Patient verbalizes understanding of instructions provided today and agrees to view in [place].  Telephone follow up appointment with pharmacy team member scheduled for: 2 months  Charlene Brooke, PharmD, [facility] Clinical Pharmacist [facility] [phone]

## 2021-05-07 ENCOUNTER — Ambulatory Visit: Payer: Medicare Other | Admitting: Radiation Oncology

## 2021-05-08 DIAGNOSIS — H353211 Exudative age-related macular degeneration, right eye, with active choroidal neovascularization: Secondary | ICD-10-CM | POA: Diagnosis not present

## 2021-05-17 DIAGNOSIS — I10 Essential (primary) hypertension: Secondary | ICD-10-CM

## 2021-05-17 DIAGNOSIS — N1832 Chronic kidney disease, stage 3b: Secondary | ICD-10-CM

## 2021-05-17 DIAGNOSIS — E7800 Pure hypercholesterolemia, unspecified: Secondary | ICD-10-CM | POA: Diagnosis not present

## 2021-05-27 ENCOUNTER — Telehealth: Payer: Self-pay

## 2021-05-27 NOTE — Progress Notes (Signed)
Chronic Care Management Pharmacy Assistant   Name: INDA MCGLOTHEN  MRN: [id number] DOB: June 05, 1940  Reason for Encounter: CCM (Hypertension Disease State)   Recent office visits:  None since last CCM contact  Recent consult visits:  None since last CCM contact  Hospital visits:  None in previous 6 months  Medications: Outpatient Encounter Medications as of 05/27/2021  Medication Sig   acetaminophen (TYLENOL) 500 MG tablet Take 500 mg by mouth every 6 (six) hours as needed (FOR PAIN.).    albuterol (VENTOLIN HFA) 108 (90 Base) MCG/ACT inhaler Inhale 2 puffs into the lungs every 4 (four) hours as needed for wheezing.   amLODipine (NORVASC) 10 MG tablet Take 1 tablet (10 mg total) by mouth daily.   Bacillus Coagulans-Inulin (PROBIOTIC) 1-250 BILLION-MG CAPS Take by mouth.   benazepril (LOTENSIN) 20 MG tablet Take 1 tablet (20 mg total) by mouth daily.   Cholecalciferol 25 MCG (1000 UT) capsule Take 1,000 Units by mouth.   citalopram (CELEXA) 40 MG tablet Take 1 tablet (40 mg total) by mouth at bedtime.   cyclobenzaprine (FLEXERIL) 10 MG tablet TAKE 1/2 TO 1 PILL UP TO 3 TIMES DAILY AS NEEDED FOR HEADACHE OR MUSCLE SPASM, CAUTION OF SEDATION   dicyclomine (BENTYL) 20 MG tablet Take 1 tablet (20 mg total) by mouth 2 (two) times daily before a meal.   diphenhydrAMINE-zinc acetate (BENADRYL ITCH STOPPING) cream Apply 1 application topically daily as needed for itching.   DM-APAP-CPM (CORICIDIN HBP) 10-325-2 MG TABS Take by mouth.   fexofenadine (ALLEGRA) 180 MG tablet Take 180 mg by mouth daily.   furosemide (LASIX) 20 MG tablet Take 1 tablet (20 mg total) by mouth daily.   glucose blood test strip GE100 brand. Check blood sugar once a day. Dx E11.9   hydrocortisone cream 1 % Apply 1 application topically 2 (two) times daily.   Iron-Vitamin C (VITRON-C PO) Take 1 capsule by mouth daily.   Ketotifen Fumarate (ALAWAY OP) Place 1-2 drops into both eyes 3 (three) times daily as needed (for  eye irritation.).    Lancets (ONETOUCH ULTRASOFT) lancets CHECK BLOOD GLUCOSE ONCE DAILY AND AS DIRECTED FOR DM 250.0   loperamide (IMODIUM) 2 MG capsule Take 2-4 mg by mouth 4 (four) times daily as needed for diarrhea or loose stools.   meclizine (ANTIVERT) 25 MG tablet Take 25 mg by mouth 3 (three) times daily as needed (for dizziness/vertigo).   neomycin-bacitracin-polymyxin (NEOSPORIN) ointment Apply 1 application topically every 12 (twelve) hours.   omeprazole (PRILOSEC) 20 MG capsule Take 1 capsule (20 mg total) by mouth daily.   ONETOUCH DELICA LANCETS FINE MISC 1 each by Other route as directed. CHECK BLOOD GLUCOSE ONCE DAILY AND AS DIRECTED FOR DIABETES MELLITIS   rosuvastatin (CRESTOR) 10 MG tablet TAKE 1/2 TABLET BY MOUTH DAILY   sodium chloride (MURO 128) 5 % ophthalmic solution Place 1 drop into both eyes 4 (four) times daily as needed for eye irritation.    Triamcinolone Acetonide (NASACORT ALLERGY 24HR NA) Place 1 spray into the nose daily as needed (for allergies.).    triamcinolone cream (KENALOG) 0.1 % Apply 1 application topically 2 (two) times daily.   vitamin B-12 (CYANOCOBALAMIN) 1000 MCG tablet Take 1,000 mcg by mouth at bedtime.   No facility-administered encounter medications on file as of 05/27/2021.    Recent Office Vitals: BP Readings from Last 3 Encounters:  03/21/21 (!) 145/70  01/31/21 (!) 146/73  09/16/20 136/68   Pulse Readings from Last 3  Encounters:  03/21/21 75  01/31/21 68  09/16/20 65    Wt Readings from Last 3 Encounters:  03/21/21 197 lb 8 oz (89.6 kg)  01/31/21 197 lb (89.4 kg)  09/16/20 202 lb 9 oz (91.9 kg)    Kidney Function Lab Results  Component Value Date/Time   CREATININE 1.23 (H) 03/21/2021 10:54 AM   CREATININE 1.37 (H) 01/24/2021 10:47 AM   GFR 42.01 (L) 03/21/2021 10:54 AM   GFRNONAA 40 (L) 01/24/2021 10:47 AM   GFRAA 46 (L) 01/17/2020 09:46 AM   BMP Latest Ref Rng & Units 03/21/2021 01/24/2021 07/30/2020  Glucose 70 - 99 mg/dL  88 89 89  BUN 6 - 23 mg/dL 25(H) 31(H) 28(H)  Creatinine 0.40 - 1.20 mg/dL 1.23(H) 1.37(H) 1.40(H)  Sodium 135 - 145 mEq/L 140 137 139  Potassium 3.5 - 5.1 mEq/L 4.7 4.5 4.4  Chloride 96 - 112 mEq/L 105 104 103  CO2 19 - 32 mEq/L 28 25 26   Calcium 8.4 - 10.5 mg/dL 9.1 8.9 8.7(L)   Contacted patient on 05/28/2021 to discuss hypertension disease state  Current antihypertensive regimen:  Amlodipine 10 mg daily AM Benazepril 20 mg daily AM Furosemide 20 mg daily  Patient verbally confirms she is taking the above medications as directed. Yes  How often are you checking your Blood Pressure? Patient does not check her blood pressure at home. She got a new machine and took it in the office; machine was not reading correctly so she stopped taking her pressure. I advised patient to take both machines with her to her next visit. Patient asked me if she should resume taking her blood pressure daily. I asked patient to take it daily starting tomorrow and I would call her back on 06/06/2021 for her log. Patient voiced understanding and agreed.   Wrist or arm cuff: Arm Caffeine intake: Patient drinks only when she goes to a restaurant. At home it is all caffeine free.  Salt intake: Patient does not add salt.  Over the counter medications including pseudoephedrine or NSAIDs? Tylenol (takes two tablets 2-3 times a day - everyday - 500 mg) and Coricidin Allergy (takes one time a night when allergies are bothering her - 10 mg) and Chlorpheniramine Maleate (takes 1 at a time when allergies are bothering her).  What recent interventions/DTPs have been made by any provider to improve Blood Pressure control since last CPP Visit: Patient to get a new BP monitor and start checking BP at home. May consider reducing Amlodipine to help with swelling, depending on home BP readings.   Any recent hospitalizations or ED visits since last visit with CPP? No  What diet changes have been made to improve Blood Pressure  Control?  Patient watches what she eats; tries not to eat spicy, salty or processed.   What exercise is being done to improve your Blood Pressure Control?  Patient has not been doing any exercise. She has a long driveway and she walks that.   Patient would like to know if taking Tylenol regularly can thin your blood. Patient takes Tylenol regularly (she takes two 500 mg tablets 2- 3 times a day). I advised patient that I would ask and call her back with the answer.  Called patient back and informed her that Charlene Brooke stated Tylenol will not thin the blood. Other OTC's such as Advil or Aleve can thin the blood, but not Tylenol. Patient thanked me for the information.  Adherence Review: Is the patient currently on ACE/ARB medication?  Yes Does the patient have >5 day gap between last estimated fill dates? No  Star Rating Drugs:  Medication:  Last Fill: Day Supply Rosuvastatin 10 mg 03/17/2021 90 Benazepril 20 mg 03/21/2021 90  Care Gaps: Annual wellness visit in last year? No - appointment on 06/03/2021 for [facility] Most Recent BP reading: 145/70 on 03/21/2021  Upcoming appointments: PCP appointment on 06/03/2021 for [facility] CCM appointment on 07/01/2021 @ 1:00 pm with Pecola Leisure, CPP notified  Marijean Niemann, [place] [phone]  Time Spent: 65 Minutes

## 2021-05-28 ENCOUNTER — Telehealth: Payer: Self-pay

## 2021-05-28 NOTE — Progress Notes (Signed)
Error
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
## 2021-06-03 ENCOUNTER — Ambulatory Visit (INDEPENDENT_AMBULATORY_CARE_PROVIDER_SITE_OTHER): Payer: Medicare Other

## 2021-06-03 VITALS — Ht 63.0 in | Wt 197.0 lb

## 2021-06-03 DIAGNOSIS — E2839 Other primary ovarian failure: Secondary | ICD-10-CM

## 2021-06-03 DIAGNOSIS — Z0000 Encounter for general adult medical examination without abnormal findings: Secondary | ICD-10-CM

## 2021-06-03 NOTE — Patient Instructions (Signed)
Ann Cummings , Thank you for taking time to complete your Medicare Wellness Visit. I appreciate your ongoing commitment to your health goals. Please review the following plan we discussed and let me know if I can assist you in the future.   Screening recommendations/referrals: Colonoscopy: no longer required  Mammogram: up to date, completed 07/24/20, you have an appointment scheduled for 07/28/21 Bone Density: due, last completed 11/01/17, ordered today someone will call to schedule an appointment Recommended yearly ophthalmology/optometry visit for glaucoma screening and checkup Recommended yearly dental visit for hygiene and checkup  Vaccinations: Influenza vaccine: up to date Pneumococcal vaccine: up to date Tdap vaccine: Due last completed 10/14/06-May obtain vaccine at our office or your local pharmacy. Shingles vaccine: Discuss with local pharmacy   Covid-19:newest booster available at your local pharmacy  Advanced directives: Please bring a copy of Living Will and/or [place] for your chart.   Conditions/risks identified: see problem list  Next appointment: Follow up in one year for your annual wellness visit 06/04/22 @ 12:00pm , this will be a telephone visit.   Preventive Care 79 Years and Older, Female Preventive care refers to lifestyle choices and visits with your health care provider that can promote health and wellness. What does preventive care include? A yearly physical exam. This is also called an annual well check. Dental exams once or twice a year. Routine eye exams. Ask your health care provider how often you should have your eyes checked. Personal lifestyle choices, including: Daily care of your teeth and gums. Regular physical activity. Eating a healthy diet. Avoiding tobacco and drug use. Limiting alcohol use. Practicing safe sex. Taking low-dose aspirin every day. Taking vitamin and mineral supplements as recommended by your health care  provider. What happens during an annual well check? The services and screenings done by your health care provider during your annual well check will depend on your age, overall health, lifestyle risk factors, and family history of disease. Counseling  Your health care provider may ask you questions about your: Alcohol use. Tobacco use. Drug use. Emotional well-being. Home and relationship well-being. Sexual activity. Eating habits. History of falls. Memory and ability to understand (cognition). Work and work Statistician. Reproductive health. Screening  You may have the following tests or measurements: Height, weight, and BMI. Blood pressure. Lipid and cholesterol levels. These may be checked every 5 years, or more frequently if you are over 15 years old. Skin check. Lung cancer screening. You may have this screening every year starting at age 79 if you have a 30-pack-year history of smoking and currently smoke or have quit within the past 15 years. Fecal occult blood test (FOBT) of the stool. You may have this test every year starting at age 79. Flexible sigmoidoscopy or colonoscopy. You may have a sigmoidoscopy every 5 years or a colonoscopy every 10 years starting at age 61. Hepatitis C blood test. Hepatitis B blood test. Sexually transmitted disease (STD) testing. Diabetes screening. This is done by checking your blood sugar (glucose) after you have not eaten for a while (fasting). You may have this done every 1-3 years. Bone density scan. This is done to screen for osteoporosis. You may have this done starting at age 79 Mammogram. This may be done every 1-2 years. Talk to your health care provider about how often you should have regular mammograms. Talk with your health care provider about your test results, treatment options, and if necessary, the need for more tests. Vaccines  Your health  care provider may recommend certain vaccines, such as: Influenza vaccine. This is  recommended every year. Tetanus, diphtheria, and acellular pertussis (Tdap, Td) vaccine. You may need a Td booster every 10 years. Zoster vaccine. You may need this after age 79. Pneumococcal 13-valent conjugate (PCV13) vaccine. One dose is recommended after age 79. Pneumococcal polysaccharide (PPSV23) vaccine. One dose is recommended after age 79. Talk to your health care provider about which screenings and vaccines you need and how often you need them. This information is not intended to replace advice given to you by your health care provider. Make sure you discuss any questions you have with your health care provider. Document Released: 05/31/2015 Document Revised: 01/22/2016 Document Reviewed: 03/05/2015 Elsevier Interactive Patient Education  2017 [place] Prevention in the Home Falls can cause injuries. They can happen to people of all ages. There are many things you can do to make your home safe and to help prevent falls. What can I do on the outside of my home? Regularly fix the edges of walkways and driveways and fix any cracks. Remove anything that might make you trip as you walk through a door, such as a raised step or threshold. Trim any bushes or trees on the path to your home. Use bright outdoor lighting. Clear any walking paths of anything that might make someone trip, such as rocks or tools. Regularly check to see if handrails are loose or broken. Make sure that both sides of any steps have handrails. Any raised decks and porches should have guardrails on the edges. Have any leaves, snow, or ice cleared regularly. Use sand or salt on walking paths during winter. Clean up any spills in your garage right away. This includes oil or grease spills. What can I do in the bathroom? Use night lights. Install grab bars by the toilet and in the tub and shower. Do not use towel bars as grab bars. Use non-skid mats or decals in the tub or shower. If you need to sit down in  the shower, use a plastic, non-slip stool. Keep the floor dry. Clean up any water that spills on the floor as soon as it happens. Remove soap buildup in the tub or shower regularly. Attach bath mats securely with double-sided non-slip rug tape. Do not have throw rugs and other things on the floor that can make you trip. What can I do in the bedroom? Use night lights. Make sure that you have a light by your bed that is easy to reach. Do not use any sheets or blankets that are too big for your bed. They should not hang down onto the floor. Have a firm chair that has side arms. You can use this for support while you get dressed. Do not have throw rugs and other things on the floor that can make you trip. What can I do in the kitchen? Clean up any spills right away. Avoid walking on wet floors. Keep items that you use a lot in easy-to-reach places. If you need to reach something above you, use a strong step stool that has a grab bar. Keep electrical cords out of the way. Do not use floor polish or wax that makes floors slippery. If you must use wax, use non-skid floor wax. Do not have throw rugs and other things on the floor that can make you trip. What can I do with my stairs? Do not leave any items on the stairs. Make sure that there are handrails on  both sides of the stairs and use them. Fix handrails that are broken or loose. Make sure that handrails are as long as the stairways. Check any carpeting to make sure that it is firmly attached to the stairs. Fix any carpet that is loose or worn. Avoid having throw rugs at the top or bottom of the stairs. If you do have throw rugs, attach them to the floor with carpet tape. Make sure that you have a light switch at the top of the stairs and the bottom of the stairs. If you do not have them, ask someone to add them for you. What else can I do to help prevent falls? Wear shoes that: Do not have high heels. Have rubber bottoms. Are comfortable  and fit you well. Are closed at the toe. Do not wear sandals. If you use a stepladder: Make sure that it is fully opened. Do not climb a closed stepladder. Make sure that both sides of the stepladder are locked into place. Ask someone to hold it for you, if possible. Clearly mark and make sure that you can see: Any grab bars or handrails. First and last steps. Where the edge of each step is. Use tools that help you move around (mobility aids) if they are needed. These include: Canes. Walkers. Scooters. Crutches. Turn on the lights when you go into a dark area. Replace any light bulbs as soon as they burn out. Set up your furniture so you have a clear path. Avoid moving your furniture around. If any of your floors are uneven, fix them. If there are any pets around you, be aware of where they are. Review your medicines with your doctor. Some medicines can make you feel dizzy. This can increase your chance of falling. Ask your doctor what other things that you can do to help prevent falls. This information is not intended to replace advice given to you by your health care provider. Make sure you discuss any questions you have with your health care provider. Document Released: 02/28/2009 Document Revised: 10/10/2015 Document Reviewed: 06/08/2014 Elsevier Interactive Patient Education  2017 [employer].

## 2021-06-13 ENCOUNTER — Ambulatory Visit
Admission: RE | Admit: 2021-06-13 | Discharge: 2021-06-13 | Disposition: A | Discharge status: Home / Self Care | Payer: Medicare Other | Source: Ambulatory Visit | Attending: Radiation Oncology | Admitting: Radiation Oncology

## 2021-06-13 ENCOUNTER — Other Ambulatory Visit: Payer: Self-pay

## 2021-06-13 VITALS — BP 149/61 | HR 66 | Temp 97.9°F | Wt 200.0 lb

## 2021-06-13 DIAGNOSIS — Z923 Personal history of irradiation: Secondary | ICD-10-CM | POA: Insufficient documentation

## 2021-06-13 DIAGNOSIS — Z170 Estrogen receptor positive status [ER+]: Secondary | ICD-10-CM

## 2021-06-13 DIAGNOSIS — Z853 Personal history of malignant neoplasm of breast: Secondary | ICD-10-CM | POA: Insufficient documentation

## 2021-06-13 DIAGNOSIS — C50412 Malignant neoplasm of upper-outer quadrant of left female breast: Secondary | ICD-10-CM

## 2021-06-13 NOTE — Progress Notes (Signed)
Radiation Oncology Follow up Note  Name: Ann Cummings   Date:   06/13/2021 MRN:  [id number] DOB: March 15, 1941    This 81 y.o. female presents to the clinic today for 3-1/2-year follow-up status post accelerated partial breast radiation to her left breast for ER/PR positive invasive mammary carcinoma.  REFERRING PROVIDER: Tower, [name], MD  HPI: Patient is a 81 year old female now seen out close to 4 years having completed accelerated partial breast radiation to her left breast for stage I ER/PR positive invasive mammary carcinoma.  Seen today in routine follow-up she is doing well.  She has declined aromatase inhibitor treatment.  She specifically denies breast tenderness cough or bone pain..  She had mammograms back in March which I have reviewed were BI-RADS 2 benign.  COMPLICATIONS OF TREATMENT: none  FOLLOW UP COMPLIANCE: keeps appointments   PHYSICAL EXAM:  BP (!) 149/61 (BP Location: Right Arm, Patient Position: Sitting)    Pulse 66    Temp 97.9 F (36.6 C) (Tympanic)    Wt 200 lb (90.7 kg)    BMI 35.43 kg/m  Lungs are clear to A&P cardiac examination essentially unremarkable with regular rate and rhythm. No dominant mass or nodularity is noted in either breast in 2 positions examined. Incision is well-healed. No axillary or supraclavicular adenopathy is appreciated. Cosmetic result is excellent.  Well-developed well-nourished patient in NAD. HEENT reveals PERLA, EOMI, discs not visualized.  Oral cavity is clear. No oral mucosal lesions are identified. Neck is clear without evidence of cervical or supraclavicular adenopathy. Lungs are clear to A&P. Cardiac examination is essentially unremarkable with regular rate and rhythm without murmur rub or thrill. Abdomen is benign with no organomegaly or masses noted. Motor sensory and DTR levels are equal and symmetric in the upper and lower extremities. Cranial nerves II through XII are grossly intact. Proprioception is intact. No peripheral  adenopathy or edema is identified. No motor or sensory levels are noted. Crude visual fields are within normal range.  RADIOLOGY RESULTS: Mammogram and ultrasound reviewed compatible with above-stated findings  PLAN: Present time patient is now out close to 4 years with no evidence of disease.  I am pleased with her overall progress.  She continues follow-up care with medical oncology.  I am going to discontinue follow-up care.  Be happy to reevaluate the patient anytime should further treatment be indicated.  I would like to take this opportunity to thank you for allowing me to participate in the care of your patient.Noreene Filbert, MD

## 2021-06-19 DIAGNOSIS — H353211 Exudative age-related macular degeneration, right eye, with active choroidal neovascularization: Secondary | ICD-10-CM | POA: Diagnosis not present

## 2021-06-19 DIAGNOSIS — D3132 Benign neoplasm of left choroid: Secondary | ICD-10-CM | POA: Diagnosis not present

## 2021-06-19 DIAGNOSIS — H35372 Puckering of macula, left eye: Secondary | ICD-10-CM | POA: Diagnosis not present

## 2021-06-19 DIAGNOSIS — H34832 Tributary (branch) retinal vein occlusion, left eye, with macular edema: Secondary | ICD-10-CM | POA: Diagnosis not present

## 2021-06-26 ENCOUNTER — Telehealth: Payer: Self-pay

## 2021-06-26 NOTE — Progress Notes (Signed)
Chronic Care Management Pharmacy Assistant   Name: Ann Cummings  MRN: [id number] DOB: 07/07/1940  Reason for Encounter: CCM (Appointment Reminder)   Medications: Outpatient Encounter Medications as of 06/26/2021  Medication Sig   acetaminophen (TYLENOL) 500 MG tablet Take 500 mg by mouth every 6 (six) hours as needed (FOR PAIN.).    albuterol (VENTOLIN HFA) 108 (90 Base) MCG/ACT inhaler Inhale 2 puffs into the lungs every 4 (four) hours as needed for wheezing.   amLODipine (NORVASC) 10 MG tablet Take 1 tablet (10 mg total) by mouth daily.   Bacillus Coagulans-Inulin (PROBIOTIC) 1-250 BILLION-MG CAPS Take by mouth.   benazepril (LOTENSIN) 20 MG tablet Take 1 tablet (20 mg total) by mouth daily.   Cholecalciferol 25 MCG (1000 UT) capsule Take 1,000 Units by mouth.   citalopram (CELEXA) 40 MG tablet Take 1 tablet (40 mg total) by mouth at bedtime.   cyclobenzaprine (FLEXERIL) 10 MG tablet TAKE 1/2 TO 1 PILL UP TO 3 TIMES DAILY AS NEEDED FOR HEADACHE OR MUSCLE SPASM, CAUTION OF SEDATION   dicyclomine (BENTYL) 20 MG tablet Take 1 tablet (20 mg total) by mouth 2 (two) times daily before a meal.   diphenhydrAMINE-zinc acetate (BENADRYL ITCH STOPPING) cream Apply 1 application topically daily as needed for itching.   fexofenadine (ALLEGRA) 180 MG tablet Take 180 mg by mouth daily as needed.   furosemide (LASIX) 20 MG tablet Take 1 tablet (20 mg total) by mouth daily.   glucose blood test strip GE100 brand. Check blood sugar once a day. Dx E11.9   hydrocortisone cream 1 % Apply 1 application topically 2 (two) times daily.   Iron-Vitamin C (VITRON-C PO) Take 1 capsule by mouth daily.   Ketotifen Fumarate (ALAWAY OP) Place 1-2 drops into both eyes 3 (three) times daily as needed (for eye irritation.).    Lancets (ONETOUCH ULTRASOFT) lancets CHECK BLOOD GLUCOSE ONCE DAILY AND AS DIRECTED FOR DM 250.0   loperamide (IMODIUM) 2 MG capsule Take 2-4 mg by mouth 4 (four) times daily as needed for  diarrhea or loose stools.   meclizine (ANTIVERT) 25 MG tablet Take 25 mg by mouth 3 (three) times daily as needed (for dizziness/vertigo).   neomycin-bacitracin-polymyxin (NEOSPORIN) ointment Apply 1 application topically every 12 (twelve) hours.   omeprazole (PRILOSEC) 20 MG capsule Take 1 capsule (20 mg total) by mouth daily.   ONETOUCH DELICA LANCETS FINE MISC 1 each by Other route as directed. CHECK BLOOD GLUCOSE ONCE DAILY AND AS DIRECTED FOR DIABETES MELLITIS   rosuvastatin (CRESTOR) 10 MG tablet TAKE 1/2 TABLET BY MOUTH DAILY   sodium chloride (MURO 128) 5 % ophthalmic solution Place 1 drop into both eyes 4 (four) times daily as needed for eye irritation.    Triamcinolone Acetonide (NASACORT ALLERGY 24HR NA) Place 1 spray into the nose daily as needed (for allergies.).    triamcinolone cream (KENALOG) 0.1 % Apply 1 application topically 2 (two) times daily.   vitamin B-12 (CYANOCOBALAMIN) 1000 MCG tablet Take 1,000 mcg by mouth at bedtime.   No facility-administered encounter medications on file as of 06/26/2021.   Ann Cummings was contacted to remind of upcoming telephone visit with Charlene Brooke on 07/01/2021 at 1:00 pm. Patient was reminded to have all medications, supplements and any blood glucose and blood pressure readings available for review at appointment. If unable to reach, a voicemail was left for patient.   Are you having any problems with your medications? No   Do you have any  concerns you like to discuss with the pharmacist? No  Star Rating Drugs: Medication:  Last Fill: Day Supply Rosuvastatin 10 mg 06/22/2021 90 Benazepril 20 mg 06/22/2021 [place], CPP notified  Marijean Niemann, [place] Pharmacy Assistant [phone]  Time Spent: 10 Minutes

## 2021-07-01 ENCOUNTER — Ambulatory Visit (INDEPENDENT_AMBULATORY_CARE_PROVIDER_SITE_OTHER): Payer: Medicare Other | Admitting: Pharmacist

## 2021-07-01 ENCOUNTER — Other Ambulatory Visit: Payer: Self-pay

## 2021-07-01 DIAGNOSIS — N1832 Chronic kidney disease, stage 3b: Secondary | ICD-10-CM

## 2021-07-01 DIAGNOSIS — I10 Essential (primary) hypertension: Secondary | ICD-10-CM

## 2021-07-01 DIAGNOSIS — E7800 Pure hypercholesterolemia, unspecified: Secondary | ICD-10-CM

## 2021-07-01 NOTE — Patient Instructions (Signed)
Visit Information  Phone number for Pharmacist: [phone]   Goals Addressed             This Visit's Progress    Track and Manage My Blood Pressure-Hypertension       Timeframe:  Long-Range Goal Priority:  High Start Date:    01/03/21                         Expected End Date:  05/16/22                    Follow Up Date April 2023   - check blood pressure 3 times per week - choose a place to take my blood pressure (home, clinic or office, retail store) - write blood pressure results in a log or diary -Get a new BP monitor (Omron brand is accurate)    Why is this important?   You won't feel high blood pressure, but it can still hurt your blood vessels.  High blood pressure can cause heart or kidney problems. It can also cause a stroke.  Making lifestyle changes like losing a little weight or eating less salt will help.  Checking your blood pressure at home and at different times of the day can help to control blood pressure.  If the doctor prescribes medicine remember to take it the way the doctor ordered.  Call the office if you cannot afford the medicine or if there are questions about it.     Notes:         Care Plan : [place]  Updates made by Charlton Haws, RPH since 07/01/2021 12:00 AM     Problem: Hypertension, Hyperlipidemia, and Chronic Kidney Disease   Priority: High     Long-Range Goal: Disease management   Start Date: 01/03/2021  Expected End Date: 01/03/2022  Recent Progress: On track  Priority: High  Note:   Current Barriers:  Unable to independently monitor therapeutic efficacy Suboptimal therapeutic regimen for HTN  Pharmacist Clinical Goal(s):  Patient will achieve adherence to monitoring guidelines and medication adherence to achieve therapeutic efficacy adhere to plan to optimize therapeutic regimen for HTN as evidenced by report of adherence to recommended medication management changes through collaboration with PharmD  and provider.   Interventions: 1:1 collaboration with Tower, [name], MD regarding development and update of comprehensive plan of care as evidenced by provider attestation and co-signature Inter-disciplinary care team collaboration (see longitudinal plan of care) Comprehensive medication review performed; medication list updated in electronic medical record  Hypertension / CKD stage 3b (BP goal <130/80) -Not ideally controlled - pt reports she is overcoming a GI/respiratory illness and just got her appetite back, BP has been low-normal -pt reports home readings are typically higher than office readings, she reports she is taking furosemide for swelling, she denies swelling on daily basis, denies hx of heart failure; she does report several sisters have issues with lymphedema -Current home readings: 113/57, 54 (recovering from illness) -Current treatment: Amlodipine 10 mg daily AM - Appropriate, Query Effective, Safe, Accessible Benazepril 20 mg daily AM - Appropriate, Query Effective, Safe, Accessible Furosemide 20 mg daily - Appropriate, Effective, Safe, Accessible -Medications previously tried: atenolol, triamterene/htctz -Denies hypotensive/hypertensive symptoms -Educated on BP goals and benefits of medications for prevention of heart attack, stroke and kidney damage; Importance of home blood pressure monitoring; Proper BP monitoring technique; -Counseled to monitor BP at home daily - advised to get Omron monitor for  accurate readings -Recommend to continue current medication  Hyperlipidemia: (LDL goal < 100) -Controlled - LDL is at goal; pt endorses compliance with statin and denies side effects; Triglycerides were elevated at last check, unknown if fasting lab or not -Current treatment: Rosuvastatin 10 mg - 1/2 tab daily HS - Appropriate, Effective, Safe, Accessible -Educated on Cholesterol goals; Benefits of statin for ASCVD risk reduction; non-fasting can lead to falsely elevated  triglyceridies -Recommended to continue current medication  IBS / GERD (Goal: manage symptoms) -Controlled - pt reports using loperamide before she leaves the house "just in case"; she has seen GI specialist previously and has been told to forego further colonoscopies; she denies issues with GERD -Current treatment  Dicyclomine 20 mg BID -Appropriate, Effective, Safe, Accessible Loperamide 2 mg PRN -Appropriate, Effective, Safe, Accessible Simethicone PRN -Appropriate, Effective, Safe, Accessible Omeprazole 20 mg daily - Appropriate, Effective, Safe, Accessible Probiotic -Appropriate, Effective, Safe, Accessible -Recommended to continue current medication  Migraine headache (Goal: manage symptoms) -Improving - previously pt reports near daily headaches;  she reports these have improved over last month or two; her migraines effect her motor skills/balance -Current treatment  Cyclobenzaprine 10 mg PRN Tylenol 500 mg PRN -Medications previously tried: tramadol, gabapentin, atenolol -Educated on migraine prevention options - beta blockers, amitriptyline, topiramate are first line option; pt has not tolerated atenolol due to low pulse; would avoid amitriptyline due to anticholinergic properties and age; would avoid topiramate due to potential to worsen cognitive issues; CGRP antagonists would be a good option -Can consider CGRP-antagonist Tree surgeon) for migraine prevention if desired  Health Maintenance -Vaccine gaps: Shingrix, covid booster -Counseled on Shingrix and covid booster; pt will think about getting them at local pharmacy  Patient Goals/Self-Care Activities Patient will:  - take medications as prescribed focus on medication adherence by routine check blood pressure daily, document, and provide at future appointments -Get a new BP monitor (Omron brand is a good one) -Let PCP know if you want to pursue injections to prevent migraines      Patient verbalizes understanding of  instructions and care plan provided today and agrees to view in MyChart. Active MyChart status confirmed with patient.   Telephone follow up appointment with pharmacy team member scheduled for: 2 months  Charlene Brooke, PharmD, [facility] Clinical Pharmacist [facility] [phone]

## 2021-07-01 NOTE — Progress Notes (Signed)
Chronic Care Management Pharmacy Note  07/01/2021 Name:  Ann Cummings MRN:  [id number] DOB:  May 06, 1941  Summary: CCM f/u visit -Pt reports she is recovering from possible viral illness: c/o nausea, diarrhea, cough, low appetite last week (lost 6 lbs); appetite is improving now and she is feeling better overall -BP was low-normal (113/57) today, when it is typically 150-160s/70s; previously we were concerned her home monitor was giving falsely elevated readings and pt was advised to get a new monitor, she has not done so  Recommendations/Changes made from today's visit: -Advised to call if GI/respiratory symptoms return/worsen -Advised pt to get a new BP monitor, stop using current monitor  Plan: -[place] will call patient 1 month for BP check -Pharmacist follow up televisit scheduled for 2 months -PCP 59-monthf/u due ~May 2023; not yet scheduled   Subjective: Ann MUNGINis an 81y.o. year old female who is a primary patient of Tower, MWynelle Fanny MD.  The CCM team was consulted for assistance with disease management and care coordination needs.    Engaged with patient by telephone for follow up visit in response to provider referral for pharmacy case management and/or care coordination services.   Consent to Services:  The patient was given information about Chronic Care Management services, agreed to services, and gave verbal consent prior to initiation of services.  Please see initial visit note for detailed documentation.   Patient Care Team: Tower, MWynelle Fanny MD as PCP - General BNinfa LindenCLind Guest MD as Consulting Physician (Orthopedic Surgery) PEarnie Larsson MD as Consulting Physician (Neurosurgery) IOneta Rack MD as Consulting Physician (Dermatology) YEarlie Server MD as Consulting Physician (Hematology and Oncology) FCharlton Haws [facility] Pharmacist (Pharmacist)   01/03/21: Patient came with her daughter [place]attie Hawtoday. [place] She is retired and spends most of her time at home watching TV. She does not drive due to migraines.  Recent office visits: 03/21/21 Dr TGlori BickersOV: fatigue/brain fog; recheck BP when less anxious; her home cuff runs high, stop using it. Discuss migraines at f/u;   Recent consult visits: 05/08/21 Dr HIona Hansen(opthalomology):  07/30/20 Dr. ZEdythe [facility]; f/u breast cancer; ordered labs.   Hospital visits: None in previous 6 months   Objective:  Lab Results  Component Value Date   CREATININE 1.23 (H) 03/21/2021   BUN 25 (H) 03/21/2021   GFR 42.01 (L) 03/21/2021   GFRNONAA 40 (L) 01/24/2021   GFRAA 46 (L) 01/17/2020   NA 140 03/21/2021   K 4.7 03/21/2021   CALCIUM 9.1 03/21/2021   CO2 28 03/21/2021   GLUCOSE 88 03/21/2021    Lab Results  Component Value Date/Time   HGBA1C 6.0 03/21/2021 10:54 AM   HGBA1C 6.1 02/27/2020 11:14 AM   GFR 42.01 (L) 03/21/2021 10:54 AM   GFR 38.71 (L) 02/23/2019 08:59 AM   MICROALBUR 1.6 04/24/2009 08:42 AM   MICROALBUR 9.6 (H) 04/17/2008 10:08 AM    Last diabetic Eye exam:  Lab Results  Component Value Date/Time   HMDIABEYEEXA Retinopathy (A) 05/07/2020 12:00 AM    Last diabetic Foot exam:  Lab Results  Component Value Date/Time   HMDIABFOOTEX yes 08/21/2009 12:00 AM     Lab Results  Component Value Date   CHOL 134 03/21/2021   HDL 39.40 03/21/2021   LDLCALC 61 01/15/2015   LDLDIRECT 61.0 03/21/2021   TRIG 205.0 (H) 03/21/2021   CHOLHDL 3 03/21/2021    Hepatic Function  Latest Ref Rng & Units 03/21/2021 01/24/2021 07/30/2020  Total Protein 6.0 - 8.3 g/dL 6.3 6.7 6.6  Albumin 3.5 - 5.2 g/dL 4.1 3.8 3.8  AST 0 - 37 U/L 12 13(L) 13(L)  ALT 0 - 35 U/L _0 Alk Phosphatase 39 - 117 U/L 41 46 41  Total Bilirubin 0.2 - 1.2 mg/dL 0.4 0.2(L) 0.5  Bilirubin, Direct 0.0 - 0.3 mg/dL - - -    Lab Results  Component Value Date/Time   TSH 1.32 03/21/2021 10:54 AM   TSH 1.78 02/27/2020 11:14 AM    CBC Latest Ref Rng & Units  03/21/2021 01/24/2021 07/30/2020  WBC 4.0 - 10.5 K/uL 11.5(H) 9.2 11.1(H)  Hemoglobin 12.0 - 15.0 g/dL 10.7(L) 10.8(L) 10.9(L)  Hematocrit 36.0 - 46.0 % 32.3(L) 32.9(L) 32.3(L)  Platelets 150.0 - 400.0 K/uL 327.0 307 327    Lab Results  Component Value Date/Time   VD25OH 21 (L) 05/16/2009 10:15 PM    Clinical ASCVD: No  The 10-year ASCVD risk score (Arnett DK, et al., 2019) is: 61.2%   Values used to calculate the score:     Age: 81 years     Sex: Female     Is Non-Hispanic African American: No     Diabetic: Yes     Tobacco smoker: No     Systolic Blood Pressure: 921 mmHg     Is BP treated: Yes     HDL Cholesterol: 39.4 mg/dL     Total Cholesterol: 134 mg/dL    Depression screen [facility] 2/9 06/03/2021 02/27/2020 02/28/2019  Decreased Interest 0 1 0  Down, Depressed, Hopeless 1 0 0  PHQ - 2 Score 1 1 0  Altered sleeping - 1 -  Tired, decreased energy - 2 -  Change in appetite - 1 -  Feeling bad or failure about yourself  - 0 -  Trouble concentrating - 0 -  Moving slowly or fidgety/restless - 0 -  Suicidal thoughts - 0 -  PHQ-9 Score - 5 -  Difficult doing work/chores - Not difficult at all -  Some recent data might be hidden    Social History   Tobacco Use  Smoking Status Never  Smokeless Tobacco Never   BP Readings from Last 3 Encounters:  06/13/21 (!) 149/61  03/21/21 (!) 145/70  01/31/21 (!) 146/73   Pulse Readings from Last 3 Encounters:  06/13/21 66  03/21/21 75  01/31/21 68   Wt Readings from Last 3 Encounters:  06/13/21 200 lb (90.7 kg)  06/03/21 197 lb (89.4 kg)  03/21/21 197 lb 8 oz (89.6 kg)   BMI Readings from Last 3 Encounters:  06/13/21 35.43 kg/m  06/03/21 34.90 kg/m  03/21/21 34.44 kg/m    Assessment/Interventions: Review of patient past medical history, allergies, medications, health status, including review of consultants reports, laboratory and other test data, was performed as part of comprehensive evaluation and provision of chronic care  management services.   SDOH:  (Social Determinants of Health) assessments and interventions performed: No - completed at [facility] 05/2021  SDOH Screenings   Alcohol Screen: Low Risk    Last Alcohol Screening Score (AUDIT): 0  Depression (PHQ2-9): Low Risk    PHQ-2 Score: 1  Financial Resource Strain: Low Risk    Difficulty of Paying Living Expenses: Not hard at all  Food Insecurity: No Food Insecurity   Worried About Charity fundraiser in the Last Year: Never true   Ran Out of Food in the Last Year:  Never true  Housing: Low Risk    Last Housing Risk Score: 0  Physical Activity: Inactive   Days of Exercise per Week: 0 days   Minutes of Exercise per Session: 0 min  Social Connections: Moderately Isolated   Frequency of Communication with Friends and Family: More than three times a week   Frequency of Social Gatherings with Friends and Family: Three times a week   Attends Religious Services: More than 4 times per year   Active Member of Clubs or Organizations: No   Attends Archivist Meetings: Never   Marital Status: Widowed  Stress: No Stress Concern Present   Feeling of Stress : Not at all  Tobacco Use: Low Risk    Smoking Tobacco Use: Never   Smokeless Tobacco Use: Never   Passive Exposure: Not on file  Transportation Needs: No Transportation Needs   Lack of Transportation (Medical): No   Lack of Transportation (Non-Medical): No    CCM Care Plan  Allergies  Allergen Reactions   Calcium     REACTION: severe constipation   Cetirizine Hcl     REACTION: reaction not known   Codeine     Per pt elevated blood pressure and caused haedache   Gabapentin     REACTION: Confussion   Mometasone Furoate     REACTION: not effective   Prednisone Diarrhea    Stomach swollen and IBS   Ropinirole Hydrochloride     REACTION: lightheaded and felt like going to pass out   Ampicillin Rash    Rash all over body    Medications Reviewed Today     Reviewed by Charlton Haws, [facility] (Pharmacist) on 07/01/21 at 1316  Med List Status: <None>   Medication Order Taking? Sig Documenting Provider Last Dose Status Informant  acetaminophen (TYLENOL) 500 MG tablet [id number] Yes Take 500 mg by mouth every 6 (six) hours as needed (FOR PAIN.).  [provider] Taking Active Self  albuterol (VENTOLIN HFA) 108 (90 Base) MCG/ACT inhaler [id number] Yes Inhale 2 puffs into the lungs every 4 (four) hours as needed for wheezing. Tower, [name], MD Taking Active   amLODipine (NORVASC) 10 MG tablet [id number] Yes Take 1 tablet (10 mg total) by mouth daily. Tower, [name], MD Taking Active   Bacillus Coagulans-Inulin (PROBIOTIC) 1-250 BILLION-MG CAPS [id number] Yes Take by mouth. [provider] Taking Active   benazepril (LOTENSIN) 20 MG tablet [id number] Yes Take 1 tablet (20 mg total) by mouth daily. Tower, [name], MD Taking Active   Cholecalciferol 25 MCG (1000 UT) capsule [id number] Yes Take 1,000 Units by mouth. [provider] Taking Active   citalopram (CELEXA) 40 MG tablet [id number] Yes Take 1 tablet (40 mg total) by mouth at bedtime. Tower, [name], MD Taking Active   cyclobenzaprine (FLEXERIL) 10 MG tablet [id number] Yes TAKE 1/2 TO 1 PILL UP TO 3 TIMES DAILY AS NEEDED FOR HEADACHE OR MUSCLE SPASM, CAUTION OF SEDATION Tower, [name], MD Taking Active   dicyclomine (BENTYL) 20 MG tablet [id number] Yes Take 1 tablet (20 mg total) by mouth 2 (two) times daily before a meal. Tower, [name], MD Taking Active   diphenhydrAMINE-zinc acetate (BENADRYL ITCH STOPPING) cream [id number] Yes Apply 1 application topically daily as needed for itching. [provider] Taking Active   fexofenadine (ALLEGRA) 180 MG tablet [id number] Yes Take 180 mg by mouth daily as needed. [provider] Taking Active   furosemide (LASIX) 20 MG tablet [id number] Yes Take  1 tablet (20 mg total) by mouth daily. Tower, [name], MD Taking Active   glucose blood test strip  [id number] Yes GE100 brand. Check blood sugar once a day. Dx E11.9 Tower, [name], MD Taking Active   hydrocortisone cream 1 % [id number] Yes Apply 1 application topically 2 (two) times daily. [provider] Taking Active   Iron-Vitamin C (VITRON-C PO) [id number] Yes Take 1 capsule by mouth daily. [provider] Taking Active   Ketotifen Fumarate (ALAWAY OP) [id number] Yes Place 1-2 drops into both eyes 3 (three) times daily as needed (for eye irritation.).  [provider] Taking Active Self  Lancets Glory Rosebush ULTRASOFT) lancets [id number] Yes CHECK BLOOD GLUCOSE ONCE DAILY AND AS DIRECTED FOR DM 250.0 Tower, [name], MD Taking Active Self  loperamide (IMODIUM) 2 MG capsule [id number] Yes Take 2-4 mg by mouth 4 (four) times daily as needed for diarrhea or loose stools. [provider] Taking Active Self  meclizine (ANTIVERT) 25 MG tablet [id number] Yes Take 25 mg by mouth 3 (three) times daily as needed (for dizziness/vertigo). [provider] Taking Active Self  neomycin-bacitracin-polymyxin (NEOSPORIN) ointment [id number] Yes Apply 1 application topically every 12 (twelve) hours. [provider] Taking Active   omeprazole (PRILOSEC) 20 MG capsule [id number] Yes Take 1 capsule (20 mg total) by mouth daily. Tower, [name], MD Taking Active   [facility] LANCETS FINE [place] [id number] Yes 1 each by Other route as directed. CHECK BLOOD GLUCOSE ONCE DAILY AND AS DIRECTED FOR DIABETES MELLITIS Tower, [name], MD Taking Active Self  rosuvastatin (CRESTOR) 10 MG tablet [id number] Yes TAKE 1/2 TABLET BY MOUTH DAILY Tower, [name], MD Taking Active   sodium chloride (MURO 128) 5 % ophthalmic solution [id number] Yes Place 1 drop into both eyes 4 (four) times daily as needed for eye irritation.  [provider] Taking Active Self  Triamcinolone Acetonide (NASACORT ALLERGY 24HR NA) [id number] Yes Place 1 spray into the nose daily as needed (for allergies.).   [provider] Taking Active Self           Med Note Tamala Julian, Sharlette Dense   Wed Apr 05, 2019  1:04 PM)    triamcinolone cream (KENALOG) 0.1 % [id number] Yes Apply 1 application topically 2 (two) times daily. Tower, [name], MD Taking Active   vitamin B-12 (CYANOCOBALAMIN) 1000 MCG tablet [id number] Yes Take 1,000 mcg by mouth at bedtime. [provider] Taking Active Self            Patient Active Problem List   Diagnosis Date Noted   Current use of proton pump inhibitor 03/21/2021   Macular degeneration, wet ([place]) 09/16/2020   Anemia due to stage 3a chronic kidney disease ([place]) 07/30/2020   Chronic cough 02/27/2020   Dysphagia 02/27/2020   Migraine with aura 02/22/2018   Goals of care, counseling/discussion 10/14/2017   Malignant neoplasm of upper-outer quadrant of left breast in female, estrogen receptor positive ([place]) 08/02/2017   Intertrigo 02/19/2016   Class 2 obesity due to excess calories with body mass index (BMI) of 35.0 to 35.9 in adult 08/02/2015   Encounter for Medicare annual wellness exam 01/09/2014   Chronic kidney disease, stage 3b ([place]) 01/09/2014   Eczema 07/12/2013   Pedal edema 01/06/2013   Allergic rhinitis 08/21/2009   Iron deficiency anemia 07/09/2009   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   History of TV adenoma of colon 06/07/2009   OBSTRUCTIVE SLEEP APNEA 05/31/2009   ANEMIA, B12 DEFICIENCY 05/24/2009  Depression 05/15/2009   MEMORY LOSS 04/30/2009   Prediabetes 10/26/2007   LIPOMA, BACK 10/01/2006   Hyperlipidemia 10/01/2006   CARPAL TUNNEL SYNDROME 10/01/2006   Essential hypertension 10/01/2006   IBS 10/01/2006   FIBROCYSTIC BREAST DISEASE 10/01/2006   OSTEOARTHRITIS 10/01/2006   SPINAL STENOSIS 10/01/2006   BACK PAIN, CHRONIC 10/01/2006   MIGRAINES, HX OF 10/01/2006    Immunization History  Administered Date(s) Administered   Fluad Quad(high Dose 65+) 02/28/2019, 02/27/2020   Influenza Whole 02/25/2005, 04/30/2009,  03/17/2010   Influenza,inj,Quad PF,6+ Mos 02/10/2013, 01/09/2014, 02/01/2015, 02/03/2016, 02/10/2017, 03/03/2018, 03/21/2021   PFIZER(Purple Top)SARS-COV-2 Vaccination 11/02/2019, 11/23/2019   Pneumococcal Conjugate-13 01/09/2014   Pneumococcal Polysaccharide-23 04/23/2008   Td 04/13/1995, 10/14/2006    Conditions to be addressed/monitored:  Hypertension, Hyperlipidemia, and Chronic Kidney Disease  Care Plan : [place]  Updates made by Charlton Haws, [place] since 07/01/2021 12:00 AM     Problem: Hypertension, Hyperlipidemia, and Chronic Kidney Disease   Priority: High     Long-Range Goal: Disease management   Start Date: 01/03/2021  Expected End Date: 01/03/2022  Recent Progress: On track  Priority: High  Note:   Current Barriers:  Unable to independently monitor therapeutic efficacy Suboptimal therapeutic regimen for HTN  Pharmacist Clinical Goal(s):  Patient will achieve adherence to monitoring guidelines and medication adherence to achieve therapeutic efficacy adhere to plan to optimize therapeutic regimen for HTN as evidenced by report of adherence to recommended medication management changes through collaboration with PharmD and provider.   Interventions: 1:1 collaboration with Tower, [name], MD regarding development and update of comprehensive plan of care as evidenced by provider attestation and co-signature Inter-disciplinary care team collaboration (see longitudinal plan of care) Comprehensive medication review performed; medication list updated in electronic medical record  Hypertension / CKD stage 3b (BP goal <130/80) -Not ideally controlled - pt reports she is overcoming a GI/respiratory illness and just got her appetite back, BP has been low-normal -pt reports home readings are typically higher than office readings, she reports she is taking furosemide for swelling, she denies swelling on daily basis, denies hx of heart failure; she does report  several sisters have issues with lymphedema -Current home readings: 113/57, 54 (recovering from illness) -Current treatment: Amlodipine 10 mg daily AM - Appropriate, Query Effective, Safe, Accessible Benazepril 20 mg daily AM - Appropriate, Query Effective, Safe, Accessible Furosemide 20 mg daily - Appropriate, Effective, Safe, Accessible -Medications previously tried: atenolol, triamterene/htctz -Denies hypotensive/hypertensive symptoms -Educated on BP goals and benefits of medications for prevention of heart attack, stroke and kidney damage; Importance of home blood pressure monitoring; Proper BP monitoring technique; -Counseled to monitor BP at home daily - advised to get Omron monitor for accurate readings -Recommend to continue current medication  Hyperlipidemia: (LDL goal < 100) -Controlled - LDL is at goal; pt endorses compliance with statin and denies side effects; Triglycerides were elevated at last check, unknown if fasting lab or not -Current treatment: Rosuvastatin 10 mg - 1/2 tab daily HS - Appropriate, Effective, Safe, Accessible -Educated on Cholesterol goals; Benefits of statin for ASCVD risk reduction; non-fasting can lead to falsely elevated triglyceridies -Recommended to continue current medication  IBS / GERD (Goal: manage symptoms) -Controlled - pt reports using loperamide before she leaves the house "just in case"; she has seen GI specialist previously and has been told to forego further colonoscopies; she denies issues with GERD -Current treatment  Dicyclomine 20 mg BID -Appropriate, Effective, Safe, Accessible Loperamide 2 mg PRN -Appropriate, Effective,  Safe, Accessible Simethicone PRN -Appropriate, Effective, Safe, Accessible Omeprazole 20 mg daily - Appropriate, Effective, Safe, Accessible Probiotic -Appropriate, Effective, Safe, Accessible -Recommended to continue current medication  Migraine headache (Goal: manage symptoms) -Improving - previously pt reports  near daily headaches;  she reports these have improved over last month or two; her migraines effect her motor skills/balance -Current treatment  Cyclobenzaprine 10 mg PRN Tylenol 500 mg PRN -Medications previously tried: tramadol, gabapentin, atenolol -Educated on migraine prevention options - beta blockers, amitriptyline, topiramate are first line option; pt has not tolerated atenolol due to low pulse; would avoid amitriptyline due to anticholinergic properties and age; would avoid topiramate due to potential to worsen cognitive issues; CGRP antagonists would be a good option -Can consider CGRP-antagonist [facility] surgeon) for migraine prevention if desired  Health Maintenance -Vaccine gaps: Shingrix, covid booster -Counseled on Shingrix and covid booster; pt will think about getting them at local pharmacy  Patient Goals/Self-Care Activities Patient will:  - take medications as prescribed focus on medication adherence by routine check blood pressure daily, document, and provide at future appointments -Get a new BP monitor (Omron brand is a good one) -Let PCP know if you want to pursue injections to prevent migraines       Medication Assistance: None required.  Patient affirms current coverage meets needs.  Compliance/Adherence/Medication fill history: Care Gaps: Hep C screening Colonoscopy (due 04/24/19) - GI doctor has advised against further colonoscopies  Star-Rating Drugs: Medication:                Last Fill:         Day Supply Benazepril 60m         06/22/21    90 PDC 94% Rosuvastatin 142m    06/22/21           90 PDElkhorn9%  Patient's [facility] pharmacy is:  [place], [place]: [phone]: [id number] [place]one: [phone]: [phone] pill box? No - prefers  bottles Pt endorses 100% compliance  We discussed: Current pharmacy is preferred with insurance plan and patient is satisfied with pharmacy services Patient decided to: Continue current medication management strategy  Care Plan and Follow Up Patient Decision:  Patient agrees to Care Plan and Follow-up.  Plan: Telephone follow up appointment with care management team member scheduled for:  2 month  LinCharlene BrookeharmD, BCANaval Health Clinic ([facility])inical Pharmacist [facility] [phone]

## 2021-07-15 DIAGNOSIS — E7800 Pure hypercholesterolemia, unspecified: Secondary | ICD-10-CM

## 2021-07-15 DIAGNOSIS — I10 Essential (primary) hypertension: Secondary | ICD-10-CM | POA: Diagnosis not present

## 2021-07-15 DIAGNOSIS — N1832 Chronic kidney disease, stage 3b: Secondary | ICD-10-CM | POA: Diagnosis not present

## 2021-07-21 ENCOUNTER — Other Ambulatory Visit: Payer: Self-pay | Admitting: *Deleted

## 2021-07-21 DIAGNOSIS — D508 Other iron deficiency anemias: Secondary | ICD-10-CM

## 2021-07-28 ENCOUNTER — Other Ambulatory Visit: Payer: Self-pay

## 2021-07-28 ENCOUNTER — Inpatient Hospital Stay: Payer: Medicare Other | Attending: Oncology

## 2021-07-28 ENCOUNTER — Ambulatory Visit
Admission: RE | Admit: 2021-07-28 | Discharge: 2021-07-28 | Disposition: A | Discharge status: Home / Self Care | Payer: Medicare Other | Source: Ambulatory Visit | Attending: Oncology | Admitting: Oncology

## 2021-07-28 DIAGNOSIS — Z79899 Other long term (current) drug therapy: Secondary | ICD-10-CM | POA: Diagnosis not present

## 2021-07-28 DIAGNOSIS — C50412 Malignant neoplasm of upper-outer quadrant of left female breast: Secondary | ICD-10-CM

## 2021-07-28 DIAGNOSIS — Z923 Personal history of irradiation: Secondary | ICD-10-CM | POA: Diagnosis not present

## 2021-07-28 DIAGNOSIS — Z170 Estrogen receptor positive status [ER+]: Secondary | ICD-10-CM | POA: Insufficient documentation

## 2021-07-28 DIAGNOSIS — N183 Chronic kidney disease, stage 3 unspecified: Secondary | ICD-10-CM | POA: Insufficient documentation

## 2021-07-28 DIAGNOSIS — D631 Anemia in chronic kidney disease: Secondary | ICD-10-CM | POA: Diagnosis not present

## 2021-07-28 DIAGNOSIS — I129 Hypertensive chronic kidney disease with stage 1 through stage 4 chronic kidney disease, or unspecified chronic kidney disease: Secondary | ICD-10-CM | POA: Diagnosis not present

## 2021-07-28 DIAGNOSIS — Z1231 Encounter for screening mammogram for malignant neoplasm of breast: Secondary | ICD-10-CM | POA: Insufficient documentation

## 2021-07-28 DIAGNOSIS — D508 Other iron deficiency anemias: Secondary | ICD-10-CM

## 2021-07-28 LAB — RETIC PANEL
Immature Retic Fract: 21 % — ABNORMAL HIGH (ref 2.3–15.9)
RBC.: 3.77 MIL/uL — ABNORMAL LOW (ref 3.87–5.11)
Retic Count, Absolute: 67.9 10*3/uL (ref 19.0–186.0)
Retic Ct Pct: 1.8 % (ref 0.4–3.1)
Reticulocyte Hemoglobin: 32.7 pg (ref >27.9)

## 2021-07-28 LAB — COMPREHENSIVE METABOLIC PANEL
ALT: 11 U/L (ref 0–44)
AST: 13 U/L — ABNORMAL LOW (ref 15–41)
Albumin: 3.7 g/dL (ref 3.5–5.0)
Alkaline Phosphatase: 45 U/L (ref 38–126)
Anion gap: 7 (ref 5–15)
BUN: 27 mg/dL — ABNORMAL HIGH (ref 8–23)
CO2: 26 mmol/L (ref 22–32)
Calcium: 8.9 mg/dL (ref 8.9–10.3)
Chloride: 102 mmol/L (ref 98–111)
Creatinine, Ser: 1.31 mg/dL — ABNORMAL HIGH (ref 0.44–1.00)
GFR, Estimated: 41 mL/min — ABNORMAL LOW (ref >60)
Glucose, Bld: 87 mg/dL (ref 70–99)
Potassium: 4.2 mmol/L (ref 3.5–5.1)
Sodium: 135 mmol/L (ref 135–145)
Total Bilirubin: 0.6 mg/dL (ref 0.3–1.2)
Total Protein: 6.7 g/dL (ref 6.5–8.1)

## 2021-07-28 LAB — CBC WITH DIFFERENTIAL/PLATELET
Abs Immature Granulocytes: 0.06 10*3/uL (ref 0.00–0.07)
Basophils Absolute: 0 10*3/uL (ref 0.0–0.1)
Basophils Relative: 0 %
Eosinophils Absolute: 0.5 10*3/uL (ref 0.0–0.5)
Eosinophils Relative: 4 %
HCT: 34.2 % — ABNORMAL LOW (ref 36.0–46.0)
Hemoglobin: 11 g/dL — ABNORMAL LOW (ref 12.0–15.0)
Immature Granulocytes: 1 %
Lymphocytes Relative: 18 %
Lymphs Abs: 2.1 10*3/uL (ref 0.7–4.0)
MCH: 29 pg (ref 26.0–34.0)
MCHC: 32.2 g/dL (ref 30.0–36.0)
MCV: 90.2 fL (ref 80.0–100.0)
Monocytes Absolute: 0.9 10*3/uL (ref 0.1–1.0)
Monocytes Relative: 8 %
Neutro Abs: 8.1 10*3/uL — ABNORMAL HIGH (ref 1.7–7.7)
Neutrophils Relative %: 69 %
Platelets: 322 10*3/uL (ref 150–400)
RBC: 3.79 MIL/uL — ABNORMAL LOW (ref 3.87–5.11)
RDW: 13.3 % (ref 11.5–15.5)
WBC: 11.6 10*3/uL — ABNORMAL HIGH (ref 4.0–10.5)
nRBC: 0 % (ref 0.0–0.2)

## 2021-07-28 LAB — IRON AND TIBC
Iron: 41 ug/dL (ref 28–170)
Saturation Ratios: 13 % (ref 10.4–31.8)
TIBC: 329 ug/dL (ref 250–450)
UIBC: 288 ug/dL

## 2021-07-28 LAB — FERRITIN: Ferritin: 62 ng/mL (ref 11–307)

## 2021-07-31 ENCOUNTER — Telehealth: Payer: Self-pay

## 2021-07-31 DIAGNOSIS — H34832 Tributary (branch) retinal vein occlusion, left eye, with macular edema: Secondary | ICD-10-CM | POA: Diagnosis not present

## 2021-07-31 DIAGNOSIS — H353211 Exudative age-related macular degeneration, right eye, with active choroidal neovascularization: Secondary | ICD-10-CM | POA: Diagnosis not present

## 2021-07-31 NOTE — Progress Notes (Signed)
? ? ?Chronic Care Management ?Pharmacy Assistant  ? ?Name: Ann Cummings  MRN: [id number] DOB: 1940/05/21 ? ?Reason for Encounter: CCM (Hypertension Disease State) ?  ?Recent office visits:  ?None since last CCM contact ? ?Recent consult visits:  ?07/28/2021 - Mammogram ? ?Hospital visits:  ?None in previous 6 months ? ?Medications: ?Outpatient Encounter Medications as of 07/31/2021  ?Medication Sig  ? acetaminophen (TYLENOL) 500 MG tablet Take 500 mg by mouth every 6 (six) hours as needed (FOR PAIN.).   ? albuterol (VENTOLIN HFA) 108 (90 Base) MCG/ACT inhaler Inhale 2 puffs into the lungs every 4 (four) hours as needed for wheezing.  ? amLODipine (NORVASC) 10 MG tablet Take 1 tablet (10 mg total) by mouth daily.  ? Bacillus Coagulans-Inulin (PROBIOTIC) 1-250 BILLION-MG CAPS Take by mouth.  ? benazepril (LOTENSIN) 20 MG tablet Take 1 tablet (20 mg total) by mouth daily.  ? Cholecalciferol 25 MCG (1000 UT) capsule Take 1,000 Units by mouth.  ? citalopram (CELEXA) 40 MG tablet Take 1 tablet (40 mg total) by mouth at bedtime.  ? cyclobenzaprine (FLEXERIL) 10 MG tablet TAKE 1/2 TO 1 PILL UP TO 3 TIMES DAILY AS NEEDED FOR HEADACHE OR MUSCLE SPASM, CAUTION OF SEDATION  ? dicyclomine (BENTYL) 20 MG tablet Take 1 tablet (20 mg total) by mouth 2 (two) times daily before a meal.  ? diphenhydrAMINE-zinc acetate (BENADRYL ITCH STOPPING) cream Apply 1 application topically daily as needed for itching.  ? fexofenadine (ALLEGRA) 180 MG tablet Take 180 mg by mouth daily as needed.  ? furosemide (LASIX) 20 MG tablet Take 1 tablet (20 mg total) by mouth daily.  ? glucose blood test strip GE100 brand. Check blood sugar once a day. Dx E11.9  ? hydrocortisone cream 1 % Apply 1 application topically 2 (two) times daily.  ? Iron-Vitamin C (VITRON-C PO) Take 1 capsule by mouth daily.  ? Ketotifen Fumarate (ALAWAY OP) Place 1-2 drops into both eyes 3 (three) times daily as needed (for eye irritation.).   ? Lancets (ONETOUCH ULTRASOFT)  lancets CHECK BLOOD GLUCOSE ONCE DAILY AND AS DIRECTED FOR DM 250.0  ? loperamide (IMODIUM) 2 MG capsule Take 2-4 mg by mouth 4 (four) times daily as needed for diarrhea or loose stools.  ? meclizine (ANTIVERT) 25 MG tablet Take 25 mg by mouth 3 (three) times daily as needed (for dizziness/vertigo).  ? neomycin-bacitracin-polymyxin (NEOSPORIN) ointment Apply 1 application topically every 12 (twelve) hours.  ? omeprazole (PRILOSEC) 20 MG capsule Take 1 capsule (20 mg total) by mouth daily.  ? ONETOUCH DELICA LANCETS FINE MISC 1 each by Other route as directed. CHECK BLOOD GLUCOSE ONCE DAILY AND AS DIRECTED FOR DIABETES MELLITIS  ? rosuvastatin (CRESTOR) 10 MG tablet TAKE 1/2 TABLET BY MOUTH DAILY  ? sodium chloride (MURO 128) 5 % ophthalmic solution Place 1 drop into both eyes 4 (four) times daily as needed for eye irritation.   ? Triamcinolone Acetonide (NASACORT ALLERGY 24HR NA) Place 1 spray into the nose daily as needed (for allergies.).   ? triamcinolone cream (KENALOG) 0.1 % Apply 1 application topically 2 (two) times daily.  ? vitamin B-12 (CYANOCOBALAMIN) 1000 MCG tablet Take 1,000 mcg by mouth at bedtime.  ? ?No facility-administered encounter medications on file as of 07/31/2021.  ? ?Recent Office Vitals: ?BP Readings from Last 3 Encounters:  ?06/13/21 (!) 149/61  ?03/21/21 (!) 145/70  ?01/31/21 (!) 146/73  ? ?Pulse Readings from Last 3 Encounters:  ?06/13/21 66  ?03/21/21 75  ?01/31/21 68  ?  ?  Wt Readings from Last 3 Encounters:  ?06/13/21 200 lb (90.7 kg)  ?06/03/21 197 lb (89.4 kg)  ?03/21/21 197 lb 8 oz (89.6 kg)  ?  ?Kidney Function ?Lab Results  ?Component Value Date/Time  ? CREATININE 1.31 (H) 07/28/2021 01:35 PM  ? CREATININE 1.23 (H) 03/21/2021 10:54 AM  ? GFR 42.01 (L) 03/21/2021 10:54 AM  ? GFRNONAA 41 (L) 07/28/2021 01:35 PM  ? GFRAA 46 (L) 01/17/2020 09:46 AM  ? ?BMP Latest Ref Rng & Units 07/28/2021 03/21/2021 01/24/2021  ?Glucose 70 - 99 mg/dL 87 88 89  ?BUN 8 - 23 mg/dL 27(H) 25(H) 31(H)   ?Creatinine 0.44 - 1.00 mg/dL 1.31(H) 1.23(H) 1.37(H)  ?Sodium 135 - 145 mmol/L 135 140 137  ?Potassium 3.5 - 5.1 mmol/L 4.2 4.7 4.5  ?Chloride 98 - 111 mmol/L 102 105 104  ?CO2 22 - 32 mmol/L '26 28 25  '$ ?Calcium 8.9 - 10.3 mg/dL 8.9 9.1 8.9  ? ?Contacted patient on 08/04/2021 to discuss hypertension disease state ? ?Current antihypertensive regimen:  ?Amlodipine 10 mg daily AM  ?Benazepril 20 mg daily AM  ?Furosemide 20 mg daily ? Patient verbally confirms she is taking the above medications as directed. Yes ? ?How often are you checking your Blood Pressure? daily ? ?she checks her blood pressure in the morning before taking her medication. ? ?Date Blood Pressure Pulse  Blood Sugar - Fasting ?03/20 169/64   68  ------ ?03/19 ---------   ----  ------ ?03/17 ---------   ----  ------ ?03/16 ---------   ----  ------ ?03/14 162/73   71  108 ?03/13 163/73   69  121 ?03/12 --------   ----  ------ ?03/11 160/76   67  120 ?03/10 --------   -----  ------ ?03/09 --------   -----  ------ ?03/08 163/72   63  115 ?03/07 170/71   60  109 ?03/06 ---------   -----  ------ ?03/05 154/67   60  118 ?03/04 153/68   60  109    ?03/03 160/70   60  106 ? ?Wrist or arm cuff: Arm cuff ?Caffeine intake: Patient only drinks caffeine free ?Salt intake: Patient does not add salt ?Over the counter medications including pseudoephedrine or NSAIDs? Tylenol (takes two tablets 2-3 times a day - everyday - 500 mg) and Coricidin Allergy (takes one time a night when allergies are bothering her - 10 mg) and Chlorpheniramine Maleate (takes 1 at a time when allergies are bothering her). ? ?Any readings above 180/120? No ? ?What recent interventions/DTPs have been made by any provider to improve Blood Pressure control since last CPP Visit:  Advised to get [employer] monitor for accurate readings. ? ?Any recent hospitalizations or ED visits since last visit with CPP? No ? ?What diet changes have been made to improve Blood Pressure Control?  ?Patient tries to eat  healthy food that is not processed.  ? ?What exercise is being done to improve your Blood Pressure Control?  ?Patient walks her long driveway.  ? ?Patient would like to talk with Mendel Ryder in regards to her allergy medications and her blood pressure numbers. I did refer back to chart notes from 04/28/2021 and advised patient to no longer take Chlorpheniramine as Charlene Brooke had advised and instead advised Zyrtec, Allegra or Claritin per Lindsey's notes. I have rescheduled patient's appointment from 08/26/2021 to 08/13/2021 so patient could speak with her in regards to elevated blood pressure numbers. I have asked patient to take her blood pressure and blood  sugar daily and keep a log. Patient understood and agreed.  ? ?Adherence Review: ?Is the patient currently on ACE/ARB medication? No ?Does the patient have >5 day gap between last estimated fill dates? No ? ?Star Rating Drugs:  ?Medication:  Last Fill: Day Supply ?Rosuvastatin 10 mg 06/22/2021 90 ? ?Care Gaps: ?Annual wellness visit in last year? Yes 06/03/2021 ?Most Recent BP reading: 149/61 on 06/13/2021 ? ?Upcoming appointments: ?CCM appointment on 08/26/2021 ? ?Charlene Brooke, CPP notified ? ?Marijean Niemann, RMA ?Clinical Pharmacy Assistant ?[phone] ? ? ? ? ? ? ? ?

## 2021-08-01 ENCOUNTER — Encounter: Payer: Self-pay | Admitting: Nurse Practitioner

## 2021-08-01 ENCOUNTER — Other Ambulatory Visit: Payer: Self-pay

## 2021-08-01 ENCOUNTER — Ambulatory Visit: Payer: Medicare Other | Admitting: Oncology

## 2021-08-01 ENCOUNTER — Inpatient Hospital Stay (HOSPITAL_BASED_OUTPATIENT_CLINIC_OR_DEPARTMENT_OTHER): Payer: Medicare Other | Admitting: Nurse Practitioner

## 2021-08-01 VITALS — BP 143/57 | HR 63 | Temp 97.9°F | Resp 16 | Ht 63.0 in | Wt 197.4 lb

## 2021-08-01 DIAGNOSIS — C50412 Malignant neoplasm of upper-outer quadrant of left female breast: Secondary | ICD-10-CM | POA: Diagnosis not present

## 2021-08-01 DIAGNOSIS — N183 Chronic kidney disease, stage 3 unspecified: Secondary | ICD-10-CM | POA: Diagnosis not present

## 2021-08-01 DIAGNOSIS — Z08 Encounter for follow-up examination after completed treatment for malignant neoplasm: Secondary | ICD-10-CM | POA: Diagnosis not present

## 2021-08-01 DIAGNOSIS — D631 Anemia in chronic kidney disease: Secondary | ICD-10-CM

## 2021-08-01 DIAGNOSIS — N1832 Chronic kidney disease, stage 3b: Secondary | ICD-10-CM

## 2021-08-01 DIAGNOSIS — D508 Other iron deficiency anemias: Secondary | ICD-10-CM

## 2021-08-01 DIAGNOSIS — Z923 Personal history of irradiation: Secondary | ICD-10-CM | POA: Diagnosis not present

## 2021-08-01 DIAGNOSIS — Z170 Estrogen receptor positive status [ER+]: Secondary | ICD-10-CM | POA: Diagnosis not present

## 2021-08-01 DIAGNOSIS — I129 Hypertensive chronic kidney disease with stage 1 through stage 4 chronic kidney disease, or unspecified chronic kidney disease: Secondary | ICD-10-CM | POA: Diagnosis not present

## 2021-08-01 DIAGNOSIS — Z853 Personal history of malignant neoplasm of breast: Secondary | ICD-10-CM
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
## 2021-08-01 NOTE — Progress Notes (Signed)
Pt reports Nausea associated with migraines, diarrhea, chronic weakness in left arm. ?

## 2021-08-08 ENCOUNTER — Telehealth: Payer: Self-pay

## 2021-08-08 NOTE — Progress Notes (Signed)
? ? ?Chronic Care Management ?Pharmacy Assistant  ? ?Name: Ann Cummings  MRN: [id number] DOB: July 04, 1940 ? ?Reason for Encounter: CCM Counsellor) ?  ?Medications: ?Outpatient Encounter Medications as of 08/08/2021  ?Medication Sig  ? acetaminophen (TYLENOL) 500 MG tablet Take 500 mg by mouth every 6 (six) hours as needed (FOR PAIN.).   ? albuterol (VENTOLIN HFA) 108 (90 Base) MCG/ACT inhaler Inhale 2 puffs into the lungs every 4 (four) hours as needed for wheezing.  ? amLODipine (NORVASC) 10 MG tablet Take 1 tablet (10 mg total) by mouth daily.  ? Bacillus Coagulans-Inulin (PROBIOTIC) 1-250 BILLION-MG CAPS Take by mouth.  ? benazepril (LOTENSIN) 20 MG tablet Take 1 tablet (20 mg total) by mouth daily.  ? Cholecalciferol 25 MCG (1000 UT) capsule Take 1,000 Units by mouth.  ? citalopram (CELEXA) 40 MG tablet Take 1 tablet (40 mg total) by mouth at bedtime.  ? cyclobenzaprine (FLEXERIL) 10 MG tablet TAKE 1/2 TO 1 PILL UP TO 3 TIMES DAILY AS NEEDED FOR HEADACHE OR MUSCLE SPASM, CAUTION OF SEDATION  ? dicyclomine (BENTYL) 20 MG tablet Take 1 tablet (20 mg total) by mouth 2 (two) times daily before a meal.  ? diphenhydrAMINE-zinc acetate (BENADRYL ITCH STOPPING) cream Apply 1 application topically daily as needed for itching. (Patient not taking: Reported on 08/01/2021)  ? fexofenadine (ALLEGRA) 180 MG tablet Take 180 mg by mouth daily as needed. (Patient not taking: Reported on 08/01/2021)  ? furosemide (LASIX) 20 MG tablet Take 1 tablet (20 mg total) by mouth daily.  ? glucose blood test strip GE100 brand. Check blood sugar once a day. Dx E11.9  ? hydrocortisone cream 1 % Apply 1 application topically 2 (two) times daily.  ? Iron-Vitamin C (VITRON-C PO) Take 1 capsule by mouth daily.  ? Ketotifen Fumarate (ALAWAY OP) Place 1-2 drops into both eyes 3 (three) times daily as needed (for eye irritation.).   ? Lancets (ONETOUCH ULTRASOFT) lancets CHECK BLOOD GLUCOSE ONCE DAILY AND AS DIRECTED FOR DM 250.0  ?  loperamide (IMODIUM) 2 MG capsule Take 2-4 mg by mouth 4 (four) times daily as needed for diarrhea or loose stools.  ? meclizine (ANTIVERT) 25 MG tablet Take 25 mg by mouth 3 (three) times daily as needed (for dizziness/vertigo).  ? neomycin-bacitracin-polymyxin (NEOSPORIN) ointment Apply 1 application topically every 12 (twelve) hours. (Patient not taking: Reported on 08/01/2021)  ? omeprazole (PRILOSEC) 20 MG capsule Take 1 capsule (20 mg total) by mouth daily.  ? ONETOUCH DELICA LANCETS FINE MISC 1 each by Other route as directed. CHECK BLOOD GLUCOSE ONCE DAILY AND AS DIRECTED FOR DIABETES MELLITIS  ? rosuvastatin (CRESTOR) 10 MG tablet TAKE 1/2 TABLET BY MOUTH DAILY  ? sodium chloride (MURO 128) 5 % ophthalmic solution Place 1 drop into both eyes 4 (four) times daily as needed for eye irritation.   ? Triamcinolone Acetonide (NASACORT ALLERGY 24HR NA) Place 1 spray into the nose daily as needed (for allergies.).  (Patient not taking: Reported on 08/01/2021)  ? triamcinolone cream (KENALOG) 0.1 % Apply 1 application topically 2 (two) times daily.  ? vitamin B-12 (CYANOCOBALAMIN) 1000 MCG tablet Take 1,000 mcg by mouth at bedtime.  ? ?No facility-administered encounter medications on file as of 08/08/2021.  ? ?Ann Cummings was contacted to remind of upcoming telephone visit with Charlene Brooke on 08/13/2021 at 11:45. Patient was reminded to have any blood glucose and blood pressure readings available for review at appointment.  ? ?Patient confirmed appointment. ? ?Are you  having any problems with your medications? No  ? ?Do you have any concerns you like to discuss with the pharmacist? No ? ?Star Rating Drugs: ?Medication:  Last Fill: Day Supply ?Rosuvastatin 10 mg    06/22/2021      90 ?  ?Charlene Brooke, CPP notified ? ?Marijean Niemann, RMA ?Clinical Pharmacy Assistant ?[phone] ? ? ? ? ?

## 2021-08-11 ENCOUNTER — Ambulatory Visit: Payer: Medicare Other | Admitting: Surgery

## 2021-08-12 DIAGNOSIS — Z20822 Contact with and (suspected) exposure to covid-19: Secondary | ICD-10-CM | POA: Diagnosis not present

## 2021-08-13 ENCOUNTER — Ambulatory Visit (INDEPENDENT_AMBULATORY_CARE_PROVIDER_SITE_OTHER): Payer: Medicare Other | Admitting: Pharmacist

## 2021-08-13 DIAGNOSIS — J301 Allergic rhinitis due to pollen: Secondary | ICD-10-CM

## 2021-08-13 DIAGNOSIS — E7800 Pure hypercholesterolemia, unspecified: Secondary | ICD-10-CM

## 2021-08-13 DIAGNOSIS — I10 Essential (primary) hypertension: Secondary | ICD-10-CM

## 2021-08-13 DIAGNOSIS — N1832 Chronic kidney disease, stage 3b: Secondary | ICD-10-CM

## 2021-08-13 NOTE — Patient Instructions (Signed)
Visit Information ? ?Phone number for Pharmacist: [phone] ? ? Goals Addressed   ?None ?  ? ? ?Care Plan : [place]  ?Updates made by Charlton Haws, RPH since 08/13/2021 12:00 AM  ?  ? ?Problem: Hypertension, Hyperlipidemia, and Chronic Kidney Disease   ?Priority: High  ?  ? ?Long-Range Goal: Disease management   ?Start Date: 01/03/2021  ?Expected End Date: 01/03/2022  ?This Visit's Progress: Not on track  ?Recent Progress: On track  ?Priority: High  ?Note:   ?Current Barriers:  ?Unable to independently monitor therapeutic efficacy ?Suboptimal therapeutic regimen for HTN ? ?Pharmacist Clinical Goal(s):  ?Patient will achieve adherence to monitoring guidelines and medication adherence to achieve therapeutic efficacy ?adhere to plan to optimize therapeutic regimen for HTN as evidenced by report of adherence to recommended medication management changes through collaboration with PharmD and provider.  ? ?Interventions: ?1:1 collaboration with Tower, [name], MD regarding development and update of comprehensive plan of care as evidenced by provider attestation and co-signature ?Inter-disciplinary care team collaboration (see longitudinal plan of care) ?Comprehensive medication review performed; medication list updated in electronic medical record ? ?Hypertension / CKD stage 3b (BP goal <130/80) ?-Not ideally controlled - BP is near goal in clinic but consistently higher at home; previously there was concern that her monitor was inaccurate so she bought a new Omron monitor in December 2022 - the new monitor is also having this issue ?-Discussed OTC items that can increase BP (decongestants, NSAIDs, cough suppresants) - pt denies taking any of these currently ?-Current home readings: 142/71 today; 169/64, 162/73, 163/73, 160/76, 163/72, 170/71, 154/67, 153/68 ?-Current treatment: ?Amlodipine 10 mg daily AM - Appropriate, Query Effective ?Benazepril 20 mg daily AM - Appropriate, Query  Effective ?Furosemide 20 mg daily - Appropriate, Effective, Safe, Accessible ?-Medications previously tried: atenolol, triamterene/htctz ?-Denies hypotensive/hypertensive symptoms ?-Educated on BP goals and benefits of medications for prevention of heart attack, stroke and kidney damage; Importance of home blood pressure monitoring; Proper BP monitoring technique; ?-Counseled to monitor BP at home daily ?-Recommend to continue current medication; set up in-office visit to evaluate accuracy of home BP monitor 4/3 @ 11:45am ? ?Hyperlipidemia: (LDL goal < 100) ?-Controlled - LDL is at goal; pt endorses compliance with statin and denies side effects; Triglycerides were elevated at last check, unknown if fasting lab or not ?-Current treatment: ?Rosuvastatin 10 mg - 1/2 tab daily HS - Appropriate, Effective, Safe, Accessible ?-Educated on Cholesterol goals; Benefits of statin for ASCVD risk reduction; non-fasting can lead to falsely elevated triglyceridies ?-Recommended to continue current medication ? ?IBS / GERD (Goal: manage symptoms) ?-Controlled - pt reports using loperamide before she leaves the house "just in case"; she has seen GI specialist previously and has been told to forego further colonoscopies; she denies issues with GERD ?-Current treatment  ?Dicyclomine 20 mg BID -Appropriate, Effective, Safe, Accessible ?Loperamide 2 mg PRN -Appropriate, Effective, Safe, Accessible ?Simethicone PRN -Appropriate, Effective, Safe, Accessible ?Omeprazole 20 mg daily - Appropriate, Effective, Safe, Accessible ?Probiotic -Appropriate, Effective, Safe, Accessible ?-Recommended to continue current medication ? ?Migraine headache (Goal: manage symptoms) ?-Improving - previously pt reports near daily headaches;  she reports these have improved over last month or two; her migraines have previously affected her motor skills/balance ?-Current treatment  ?Cyclobenzaprine 10 mg PRN - Appropriate, Effective, Safe, Accessible ?Tylenol  500 mg PRN - Appropriate, Effective, Safe, Accessible ?-Medications previously tried: tramadol, gabapentin, atenolol ?-Educated on migraine prevention options - beta blockers, amitriptyline, topiramate are first line option;  pt has not tolerated atenolol due to low pulse; would avoid amitriptyline due to anticholinergic properties and age; would avoid topiramate due to potential to worsen cognitive issues; CGRP antagonists would be a good option ?-Can consider CGRP-antagonist (Emgality) for migraine prevention if desired ? ?Allergic rhinitis (Goal: manage symptoms) ?-Controlled - pt is off of Benadryl after discussion at last visit; she is now using Allegra and Benadryl only rarely for severe symptoms ?-Current treatment  ?Nasacort nasal spray PRN ?Albuterol HFA prn ?Ketotifen (Alaway) eye drops ?Systane eye drops ?Coricidin ?OTC Allegra daily ?Benadryl PRN ?-Discussed reserving Benadryl for infrequent, PRN use. Advised to use Allegra as daily maintenance ? ? ?Patient Goals/Self-Care Activities ?Patient will:  ?- take medications as prescribed ?focus on medication adherence by routine ?check blood pressure daily, document, and provide at future appointments ?-Get a new BP monitor (Omron brand is a good one) ?-Let PCP know if you want to pursue injections to prevent migraines ?  ?  ? ?Patient verbalizes understanding of instructions and care plan provided today and agrees to view in [place]. Active MyChart status confirmed with patient.   ?Telephone follow up appointment with pharmacy team member scheduled for: 1 week ? ?Charlene Brooke, PharmD, BCACP ?Clinical Pharmacist ?[facility] ?[phone] ?  ?

## 2021-08-13 NOTE — Progress Notes (Signed)
?Chronic Care Management ?Pharmacy Note ? ?08/13/2021 ?Name:  Ann Cummings MRN:  [id number] DOB:  1940/10/21 ? ?Summary: CCM F/U visit ?-Pt reports home BP on new monitor (bought Dec 2022, Omron brand) is consistently higher than BP in clinic (mostly 160s/70s, whereas recent office visit 08/01/21 was 143/57); of note she denies using OTC meds that can increase BP (NSAIDs, decongestants, or cough suppressants) ? ?Recommendations/Changes made from today's visit: ?-Set up in-office visit to evaluate accuracy of home BP monitor ? ?Plan: ?-Pharmacist follow up office visit scheduled for 4/3 @ 11:45 ?-PCP 18-monthf/u due ~May 2023; not yet scheduled ? ? ?Subjective: ?Ann KASSINGis an 81y.o. year old female who is a primary patient of Tower, MWynelle Fanny MD.  The CCM team was consulted for assistance with disease management and care coordination needs.   ? ?Engaged with patient by telephone for follow up visit in response to provider referral for pharmacy case management and/or care coordination services.  ? ?Consent to Services:  ?The patient was given information about Chronic Care Management services, agreed to services, and gave verbal consent prior to initiation of services.  Please see initial visit note for detailed documentation.  ? ?Patient Care Team: ?Tower, MWynelle Fanny MD as PCP - General ?BMcarthur Rossetti MD as Consulting Physician (Orthopedic Surgery) ?PEarnie Larsson MD as Consulting Physician (Neurosurgery) ?IOneta Rack MD as Consulting Physician (Dermatology) ?YEarlie Server MD as Consulting Physician (Hematology and Oncology) ?Raylyn Carton, LCleaster Corin [facility] Hospitalas Pharmacist (Pharmacist) ? ? ?01/03/21: Patient came with her daughter [place]attie [place]today. [place] She is retired and spends most of her time at home watching TV. She does not drive due to migraines. ? ?Recent office visits: ?03/21/21 Dr TGlori BickersOV: fatigue/brain fog; recheck BP when less anxious; her home cuff runs high, stop  using it. Discuss migraines at f/u ? ?Recent consult visits: ?08/01/21 NP LSanta LighterAZenia Resides(Heme/onc): f/u breast cancer, IDA. Declined aromatase inhibitor. Mammogram no evidence of recurrence. HGB at baseline. ? ?05/08/21 Dr HIona Hansen(opthalomology):  ?07/30/20 Dr. ZEdythe [facility]; f/u breast cancer; ordered labs. ?  ?Hospital visits: ?None in previous 6 months ? ? ?Objective: ? ?Lab Results  ?Component Value Date  ? CREATININE 1.31 (H) 07/28/2021  ? BUN 27 (H) 07/28/2021  ? GFR 42.01 (L) 03/21/2021  ? GFRNONAA 41 (L) 07/28/2021  ? GFRAA 46 (L) 01/17/2020  ? NA 135 07/28/2021  ? K 4.2 07/28/2021  ? CALCIUM 8.9 07/28/2021  ? CO2 26 07/28/2021  ? GLUCOSE 87 07/28/2021  ? ? ?Lab Results  ?Component Value Date/Time  ? HGBA1C 6.0 03/21/2021 10:54 AM  ? HGBA1C 6.1 02/27/2020 11:14 AM  ? GFR 42.01 (L) 03/21/2021 10:54 AM  ? GFR 38.71 (L) 02/23/2019 08:59 AM  ? MICROALBUR 1.6 04/24/2009 08:42 AM  ? MICROALBUR 9.6 (H) 04/17/2008 10:08 AM  ?  ?Last diabetic Eye exam:  ?Lab Results  ?Component Value Date/Time  ? HMDIABEYEEXA Retinopathy (A) 05/07/2020 12:00 AM  ?  ?Last diabetic Foot exam:  ?Lab Results  ?Component Value Date/Time  ? HMDIABFOOTEX yes 08/21/2009 12:00 AM  ?  ? ?Lab Results  ?Component Value Date  ? CHOL 134 03/21/2021  ? HDL 39.40 03/21/2021  ? L[place]61 01/15/2015  ? LDLDIRECT 61.0 03/21/2021  ? TRIG 205.0 (H) 03/21/2021  ? CHOLHDL 3 03/21/2021  ? ? ? ?  Latest Ref Rng & Units 07/28/2021  ?  1:35 PM 03/21/2021  ? 10:54 AM 01/24/2021  ?  10:47 AM  ?Hepatic Function  ?Total Protein 6.5 - 8.1 g/dL 6.7   6.3   6.7    ?Albumin 3.5 - 5.0 g/dL 3.7   4.1   3.8    ?AST 15 - 41 U/L '13   12   13    ' ?ALT 0 - 44 U/L '11   8   11    ' ?Alk Phosphatase 38 - 126 U/L 45   41   46    ?Total Bilirubin 0.3 - 1.2 mg/dL 0.6   0.4   0.2    ? ? ?Lab Results  ?Component Value Date/Time  ? TSH 1.32 03/21/2021 10:54 AM  ? TSH 1.78 02/27/2020 11:14 AM  ? ? ? ?  Latest Ref Rng & Units 07/28/2021  ?  1:35 PM 03/21/2021  ? 10:54 AM 01/24/2021  ? 10:47 AM   ?CBC  ?WBC 4.0 - 10.5 K/uL 11.6   11.5   9.2    ?Hemoglobin 12.0 - 15.0 g/dL 11.0   10.7   10.8    ?Hematocrit 36.0 - 46.0 % 34.2   32.3   32.9    ?Platelets 150 - 400 K/uL 322   327.0   307    ? ? ?Lab Results  ?Component Value Date/Time  ? VD25OH 21 (L) 05/16/2009 10:15 PM  ? ? ?Clinical ASCVD: No  ?The 10-year ASCVD risk score (Arnett DK, et al., 2019) is: 58.2% ?  Values used to calculate the score: ?    Age: 81 years ?    Sex: Female ?    Is Non-Hispanic African American: No ?    Diabetic: Yes ?    Tobacco smoker: No ?    Systolic Blood Pressure: 195 mmHg ?    Is BP treated: Yes ?    HDL Cholesterol: 39.4 mg/dL ?    Total Cholesterol: 134 mg/dL   ? ? ?  06/03/2021  ? 12:06 PM 02/27/2020  ? 11:54 AM 02/28/2019  ? 10:49 AM  ?Depression screen PHQ 2/9  ?Decreased Interest 0 1 0  ?Down, Depressed, Hopeless 1 0 0  ?PHQ - 2 Score 1 1 0  ?Altered sleeping  1   ?Tired, decreased energy  2   ?Change in appetite  1   ?Feeling bad or failure about yourself   0   ?Trouble concentrating  0   ?Moving slowly or fidgety/restless  0   ?Suicidal thoughts  0   ?PHQ-9 Score  5   ?Difficult doing work/chores  Not difficult at all   ?  ?Social History  ? ?Tobacco Use  ?Smoking Status Never  ?Smokeless Tobacco Never  ? ?BP Readings from Last 3 Encounters:  ?08/01/21 (!) 143/57  ?06/13/21 (!) 149/61  ?03/21/21 (!) 145/70  ? ?Pulse Readings from Last 3 Encounters:  ?08/01/21 63  ?06/13/21 66  ?03/21/21 75  ? ?Wt Readings from Last 3 Encounters:  ?08/01/21 197 lb 6.4 oz (89.5 kg)  ?06/13/21 200 lb (90.7 kg)  ?06/03/21 197 lb (89.4 kg)  ? ?BMI Readings from Last 3 Encounters:  ?08/01/21 34.97 kg/m?  ?06/13/21 35.43 kg/m?  ?06/03/21 34.90 kg/m?  ? ? ?Assessment/Interventions: Review of patient past medical history, allergies, medications, health status, including review of consultants reports, laboratory and other test data, was performed as part of comprehensive evaluation and provision of chronic care management services.  ? ?SDOH:   (Social Determinants of Health) assessments and interventions performed: No - completed at [facility] 05/2021 ? ?SDOH Screenings  ? ?  Alcohol Screen: Low Risk   ? Last Alcohol Screening Score (AUDIT): 0  ?Depression (PHQ2-9): Low Risk   ? PHQ-2 Score: 1  ?Financial Resource Strain: Low Risk   ? Difficulty of Paying Living Expenses: Not hard at all  ?Food Insecurity: No Food Insecurity  ? Worried About Charity fundraiser in the Last Year: Never true  ? Ran Out of Food in the Last Year: Never true  ?Housing: Low Risk   ? Last Housing Risk Score: 0  ?Physical Activity: Inactive  ? Days of Exercise per Week: 0 days  ? Minutes of Exercise per Session: 0 min  ?Social Connections: Moderately Isolated  ? Frequency of Communication with Friends and Family: More than three times a week  ? Frequency of Social Gatherings with Friends and Family: Three times a week  ? Attends Religious Services: More than 4 times per year  ? Active Member of Clubs or Organizations: No  ? Attends Archivist Meetings: Never  ? Marital Status: Widowed  ?Stress: No Stress Concern Present  ? Feeling of Stress : Not at all  ?Tobacco Use: Low Risk   ? Smoking Tobacco Use: Never  ? Smokeless Tobacco Use: Never  ? Passive Exposure: Not on file  ?Transportation Needs: No Transportation Needs  ? Lack of Transportation (Medical): No  ? Lack of Transportation (Non-Medical): No  ? ? ?CCM Care Plan ? ?Allergies  ?Allergen Reactions  ? Calcium   ?  REACTION: severe constipation  ? Cetirizine Hcl   ?  REACTION: reaction not known  ? Codeine   ?  Per pt elevated blood pressure and caused haedache  ? Gabapentin   ?  REACTION: Confussion  ? Mometasone Furoate   ?  REACTION: not effective  ? Prednisone Diarrhea  ?  Stomach swollen and IBS  ? Ropinirole Hydrochloride   ?  REACTION: lightheaded and felt like going to pass out  ? Ampicillin Rash  ?  Rash all over body  ? ? ?Medications Reviewed Today   ? ? Reviewed by Charlton Haws, RPH (Pharmacist) on  08/13/21 at 1204  Med List Status: <None>  ? ?Medication Order Taking? Sig Documenting Provider Last Dose Status Informant  ?acetaminophen (TYLENOL) 500 MG tablet [id number] Yes Take 500 mg by mouth every 6 (six)

## 2021-08-15 DIAGNOSIS — N1832 Chronic kidney disease, stage 3b: Secondary | ICD-10-CM

## 2021-08-15 DIAGNOSIS — I10 Essential (primary) hypertension: Secondary | ICD-10-CM | POA: Diagnosis not present

## 2021-08-15 DIAGNOSIS — E7800 Pure hypercholesterolemia, unspecified: Secondary | ICD-10-CM

## 2021-08-18 ENCOUNTER — Ambulatory Visit: Payer: Medicare Other

## 2021-08-18 ENCOUNTER — Telehealth: Payer: Self-pay

## 2021-08-18 NOTE — Progress Notes (Signed)
? ? ?  Chronic Care Management ?Pharmacy Assistant  ? ?Name: Ann Cummings  MRN: [id number] DOB: 1940-06-22 ? ? ?Reason for Encounter: CCM (Reschedule Appointment) ? ?Reschedule patient's appointment with Charlene Brooke from 08/18/2021 @ 11:45 to 08/21/2021 @ 11:45. Patient has a face-to-face appointment for blood pressure monitor issues.  ? ?Charlene Brooke, CPP notified ? ?Marijean Niemann, RMA ?Clinical Pharmacy Assistant ?[phone] ? ? ?  ?

## 2021-08-20 ENCOUNTER — Other Ambulatory Visit: Payer: Self-pay | Admitting: Family Medicine

## 2021-08-21 ENCOUNTER — Other Ambulatory Visit: Payer: Self-pay | Admitting: Family Medicine

## 2021-08-21 ENCOUNTER — Ambulatory Visit: Payer: Medicare Other

## 2021-08-21 MED ORDER — ONETOUCH DELICA LANCETS 33G MISC
1.0000 | Freq: Every day | 4 refills | Status: DC
Start: 2021-08-21 — End: 2021-08-25

## 2021-08-21 MED ORDER — ONETOUCH DELICA LANCING DEV MISC: 1.0000 | Freq: Once | 0 refills | Status: AC

## 2021-08-26 ENCOUNTER — Telehealth: Payer: Medicare Other

## 2021-08-26 MED ORDER — ONETOUCH VERIO VI STRP: ORAL_STRIP | 0 refills | Status: DC

## 2021-08-26 NOTE — Addendum Note (Signed)
Addended by: Tammi Sou on: 08/26/2021 03:05 PM ? ? Modules accepted: Orders ? ?

## 2021-08-27 NOTE — Progress Notes (Signed)
Called patient to reschedule her appointment with Charlene Brooke. Patient will need to call back to reschedule after she checks with the person who drives her. I also advised patient that she needs to make her follow up with Dr. Glori Bickers as well. Patient stated she would also make that appointment. ? ?Charlene Brooke, CPP notified ? ?Marijean Niemann, RMA ?Clinical Pharmacy Assistant ?[phone] ? ? ? ?

## 2021-08-28 ENCOUNTER — Other Ambulatory Visit: Payer: Self-pay | Admitting: Family Medicine

## 2021-08-28 MED ORDER — ONETOUCH VERIO VI STRP: ORAL_STRIP | 0 refills | Status: AC

## 2021-08-28 NOTE — Telephone Encounter (Signed)
Pharmacy isn't asking for refill request they are letting us know insurance will not cover test strips. Pharmacy's note says: ? ? ?THIS IS ONLY COVERED ON MEDICARE PART B THE DIAGNOSIS CODE IS NOT ACCEPTED BY MEDICARE PART B ? ? ?Dx. Code is for prediabetes  ?

## 2021-08-28 NOTE — Addendum Note (Signed)
Addended by: Tammi Sou on: 08/28/2021 12:06 PM ? ? Modules accepted: Orders ? ?

## 2021-09-11 DIAGNOSIS — Z20822 Contact with and (suspected) exposure to covid-19: Secondary | ICD-10-CM | POA: Diagnosis not present

## 2021-09-11 DIAGNOSIS — H35372 Puckering of macula, left eye: Secondary | ICD-10-CM | POA: Diagnosis not present

## 2021-09-11 DIAGNOSIS — H353211 Exudative age-related macular degeneration, right eye, with active choroidal neovascularization: Secondary | ICD-10-CM | POA: Diagnosis not present

## 2021-09-11 DIAGNOSIS — H34832 Tributary (branch) retinal vein occlusion, left eye, with macular edema: Secondary | ICD-10-CM | POA: Diagnosis not present

## 2021-09-11 DIAGNOSIS — D3132 Benign neoplasm of left choroid: Secondary | ICD-10-CM | POA: Diagnosis not present

## 2021-09-12 DIAGNOSIS — Z20822 Contact with and (suspected) exposure to covid-19: Secondary | ICD-10-CM | POA: Diagnosis not present

## 2021-09-15 DIAGNOSIS — Z20822 Contact with and (suspected) exposure to covid-19: Secondary | ICD-10-CM | POA: Diagnosis not present

## 2021-09-18 ENCOUNTER — Other Ambulatory Visit: Payer: Self-pay | Admitting: Family Medicine

## 2021-09-20 DIAGNOSIS — Z20828 Contact with and (suspected) exposure to other viral communicable diseases: Secondary | ICD-10-CM | POA: Diagnosis not present

## 2021-09-25 ENCOUNTER — Other Ambulatory Visit: Payer: Self-pay | Admitting: Family Medicine

## 2021-09-25 NOTE — Telephone Encounter (Signed)
CPE was on 03/21/21 and no future appts., last filled on 06/25/20 #90 tabs with 1 refill ?

## 2021-10-23 DIAGNOSIS — H353211 Exudative age-related macular degeneration, right eye, with active choroidal neovascularization: Secondary | ICD-10-CM | POA: Diagnosis not present

## 2021-10-23 DIAGNOSIS — H34832 Tributary (branch) retinal vein occlusion, left eye, with macular edema: Secondary | ICD-10-CM | POA: Diagnosis not present

## 2021-11-11 ENCOUNTER — Encounter: Payer: Self-pay | Admitting: Family Medicine

## 2021-11-24 ENCOUNTER — Telehealth: Payer: Self-pay

## 2021-11-24 NOTE — Progress Notes (Signed)
Chronic Care Management Pharmacy Assistant   Name: Ann Cummings  MRN: [id number] DOB: 1940/10/21  Reason for Encounter: CCM (Hypertension Disease State)    Recent office visits:  None since last CCM contact  Recent consult visits:  None since last CCM contact  Hospital visits:  None in previous 6 months  Medications: Outpatient Encounter Medications as of 11/24/2021  Medication Sig   acetaminophen (TYLENOL) 500 MG tablet Take 500 mg by mouth every 6 (six) hours as needed (FOR PAIN.).    albuterol (VENTOLIN HFA) 108 (90 Base) MCG/ACT inhaler Inhale 2 puffs into the lungs every 4 (four) hours as needed for wheezing.   amLODipine (NORVASC) 10 MG tablet Take 1 tablet (10 mg total) by mouth daily.   Bacillus Coagulans-Inulin (PROBIOTIC) 1-250 BILLION-MG CAPS Take by mouth.   benazepril (LOTENSIN) 20 MG tablet Take 1 tablet (20 mg total) by mouth daily.   Cholecalciferol 25 MCG (1000 UT) capsule Take 1,000 Units by mouth.   citalopram (CELEXA) 40 MG tablet Take 1 tablet (40 mg total) by mouth at bedtime.   cyclobenzaprine (FLEXERIL) 10 MG tablet TAKE 1/2 TO 1 PILL UP TO 3 TIMES DAILY AS NEEDED FOR HEADACHE OR MUSCLE SPASM, CAUTION OF SEDATION   dicyclomine (BENTYL) 20 MG tablet Take 1 tablet (20 mg total) by mouth 2 (two) times daily before a meal.   diphenhydrAMINE-zinc acetate (BENADRYL ITCH STOPPING) cream Apply 1 application. topically daily as needed for itching.   fexofenadine (ALLEGRA) 180 MG tablet Take 180 mg by mouth daily as needed.   furosemide (LASIX) 20 MG tablet Take 1 tablet (20 mg total) by mouth daily.   glucose blood (ONETOUCH VERIO) test strip Use one daily to check blood sugar (Dx. Code:  R73.03)   hydrocortisone cream 1 % Apply 1 application topically 2 (two) times daily.   Iron-Vitamin C (VITRON-C PO) Take 1 capsule by mouth daily.   Ketotifen Fumarate (ALAWAY OP) Place 1-2 drops into both eyes 3 (three) times daily as needed (for eye irritation.).    Lancets  (ONETOUCH DELICA PLUS EGBTDV76H) MISC CHECK BLOOD SUGAR EVERY DAY   loperamide (IMODIUM) 2 MG capsule Take 2-4 mg by mouth 4 (four) times daily as needed for diarrhea or loose stools.   meclizine (ANTIVERT) 25 MG tablet Take 25 mg by mouth 3 (three) times daily as needed (for dizziness/vertigo).   neomycin-bacitracin-polymyxin (NEOSPORIN) ointment Apply 1 application. topically every 12 (twelve) hours.   omeprazole (PRILOSEC) 20 MG capsule Take 1 capsule (20 mg total) by mouth daily.   ONETOUCH DELICA LANCETS FINE MISC 1 each by Other route as directed. CHECK BLOOD GLUCOSE ONCE DAILY AND AS DIRECTED FOR DIABETES MELLITIS   rosuvastatin (CRESTOR) 10 MG tablet TAKE 1/2 TABLET BY MOUTH EVERY DAY   sodium chloride (MURO 128) 5 % ophthalmic solution Place 1 drop into both eyes 4 (four) times daily as needed for eye irritation.    Triamcinolone Acetonide (NASACORT ALLERGY 24HR NA) Place 1 spray into the nose daily as needed (for allergies.).   triamcinolone cream (KENALOG) 0.1 % Apply 1 application topically 2 (two) times daily.   vitamin B-12 (CYANOCOBALAMIN) 1000 MCG tablet Take 1,000 mcg by mouth at bedtime.   No facility-administered encounter medications on file as of 11/24/2021.    Recent Office Vitals: BP Readings from Last 3 Encounters:  08/01/21 (!) 143/57  06/13/21 (!) 149/61  03/21/21 (!) 145/70   Pulse Readings from Last 3 Encounters:  08/01/21 63  06/13/21 66  03/21/21 75    Wt Readings from Last 3 Encounters:  08/01/21 197 lb 6.4 oz (89.5 kg)  06/13/21 200 lb (90.7 kg)  06/03/21 197 lb (89.4 kg)     Kidney Function Lab Results  Component Value Date/Time   CREATININE 1.31 (H) 07/28/2021 01:35 PM   CREATININE 1.23 (H) 03/21/2021 10:54 AM   GFR 42.01 (L) 03/21/2021 10:54 AM   GFRNONAA 41 (L) 07/28/2021 01:35 PM   GFRAA 46 (L) 01/17/2020 09:46 AM       Latest Ref Rng & Units 07/28/2021    1:35 PM 03/21/2021   10:54 AM 01/24/2021   10:47 AM  BMP  Glucose 70 - 99 mg/dL 87   88  89   BUN 8 - 23 mg/dL '27  25  31   '$ Creatinine 0.44 - 1.00 mg/dL 1.31  1.23  1.37   Sodium 135 - 145 mmol/L 135  140  137   Potassium 3.5 - 5.1 mmol/L 4.2  4.7  4.5   Chloride 98 - 111 mmol/L 102  105  104   CO2 22 - 32 mmol/L '26  28  25   '$ Calcium 8.9 - 10.3 mg/dL 8.9  9.1  8.9      Contacted patient on 11/24/2021 to discuss hypertension disease state  Current antihypertensive regimen:  Amlodipine 10 mg daily AM  Benazepril 20 mg daily AM  Furosemide 20 mg daily  Patient verbally confirms she is taking the above medications as directed. Yes  How often are you checking your Blood Pressure? Patient has not been taking her readings. I have asked patient to take their blood pressure daily and keep a log. Advised patient I would call back on 11/28/21 for log. Patient verbalized understanding and agreed.   Wrist or arm cuff: Arm cuff - Patient did not have her blood pressure cuff checked for accuracy Caffeine intake: Patient only drinks caffeine free drinks Salt intake: Patient does not add salt Over the counter medications including pseudoephedrine or NSAIDs?  Tylenol 500 mg (takes two tablets 2-3 times a day - everyday)  Coricidin Allergy 10 mg(takes one time a night when allergies are bothering her)  Chlorpheniramine Maleate (takes 1 at a time when allergies are bothering her).  Any readings above 180/120? No  What recent interventions/DTPs have been made by any provider to improve Blood Pressure control since last CPP Visit: No recent interventions  Any recent hospitalizations or ED visits since last visit with CPP? No  What diet changes have been made to improve Blood Pressure Control?  Patient watches what she eats and tries to eat healthy  What exercise is being done to improve your Blood Pressure Control?  Patient walks her driveway  I asked patient to reschedule her missed appointment with Charlene Brooke; patient declined.   Adherence Review: Is the patient  currently on ACE/ARB medication? No Does the patient have >5 day gap between last estimated fill dates? No  Star Rating Drugs:  Medication:  Last Fill: Day Supply Rosuvastatin 10 mg 09/18/21 90  Care Gaps: Annual wellness visit in last year? Yes 06/03/21 Most Recent BP reading: 143/57 08/01/21  Upcoming appointments: No appointments scheduled within the next 30 days.  Charlene Brooke, CPP notified  Marijean Niemann, [place] Clinical Pharmacy Assistant [phone]

## 2021-11-28 NOTE — Progress Notes (Signed)
Called patient for blood pressure log as previously discussed.   Date  Blood Pressure Pulse  Blood Glucose - fasting 07/14  140/63   67  110 07/13  152/67   64  111 07/12  149/69   64  109 07/11  144/63   62  128 07/10  135/63   72  ----  Charlene Brooke, CPP notified  Marijean Niemann, [place] Clinical Pharmacy Assistant [phone]

## 2021-12-04 DIAGNOSIS — H353211 Exudative age-related macular degeneration, right eye, with active choroidal neovascularization: Secondary | ICD-10-CM | POA: Diagnosis not present

## 2021-12-04 DIAGNOSIS — H43393 Other vitreous opacities, bilateral: Secondary | ICD-10-CM | POA: Diagnosis not present

## 2021-12-04 DIAGNOSIS — H35372 Puckering of macula, left eye: Secondary | ICD-10-CM | POA: Diagnosis not present

## 2021-12-04 DIAGNOSIS — D3132 Benign neoplasm of left choroid: Secondary | ICD-10-CM | POA: Diagnosis not present

## 2021-12-04 DIAGNOSIS — H43813 Vitreous degeneration, bilateral: Secondary | ICD-10-CM | POA: Diagnosis not present

## 2021-12-04 DIAGNOSIS — H34832 Tributary (branch) retinal vein occlusion, left eye, with macular edema: Secondary | ICD-10-CM | POA: Diagnosis not present

## 2022-01-08 ENCOUNTER — Other Ambulatory Visit: Payer: Self-pay | Admitting: Family Medicine

## 2022-01-15 DIAGNOSIS — H353211 Exudative age-related macular degeneration, right eye, with active choroidal neovascularization: Secondary | ICD-10-CM | POA: Diagnosis not present

## 2022-01-15 DIAGNOSIS — H34832 Tributary (branch) retinal vein occlusion, left eye, with macular edema: Secondary | ICD-10-CM | POA: Diagnosis not present

## 2022-01-25 ENCOUNTER — Other Ambulatory Visit: Payer: Self-pay | Admitting: Family Medicine

## 2022-01-27 NOTE — Telephone Encounter (Signed)
Last OV was CPE on 03/21/21, no future appts., last filled on 03/21/21 #180 tabs/ 1 refill, please advise

## 2022-01-29 ENCOUNTER — Telehealth: Payer: Self-pay | Admitting: Family Medicine

## 2022-01-29 MED ORDER — DICYCLOMINE HCL 20 MG PO TABS: 20.0000 mg | ORAL_TABLET | Freq: Two times a day (BID) | ORAL | 0 refills | Status: DC

## 2022-01-29 NOTE — Telephone Encounter (Signed)
  Encourage patient to contact the pharmacy for refills or they can request refills through [facility]  Did the patient contact the pharmacy:  Yes  LAST APPOINTMENT DATE: 03/21/2021  NEXT APPOINTMENT DATE: N/A  MEDICATION: dicyclomine (BENTYL) 20 MG tablet  Is the patient out of medication? Yes  PHARMACY: [facility] [place]  Let patient know to contact pharmacy at the end of the day to make sure medication is ready.  Please notify patient to allow 48-72 hours to process

## 2022-01-30 ENCOUNTER — Other Ambulatory Visit: Payer: Self-pay

## 2022-01-30 DIAGNOSIS — D631 Anemia in chronic kidney disease: Secondary | ICD-10-CM

## 2022-01-30 DIAGNOSIS — Z08 Encounter for follow-up examination after completed treatment for malignant neoplasm: Secondary | ICD-10-CM

## 2022-02-02 ENCOUNTER — Inpatient Hospital Stay: Payer: Medicare Other | Attending: Oncology

## 2022-02-02 DIAGNOSIS — C50412 Malignant neoplasm of upper-outer quadrant of left female breast: Secondary | ICD-10-CM | POA: Insufficient documentation

## 2022-02-02 DIAGNOSIS — Z170 Estrogen receptor positive status [ER+]: Secondary | ICD-10-CM | POA: Insufficient documentation

## 2022-02-02 DIAGNOSIS — D631 Anemia in chronic kidney disease: Secondary | ICD-10-CM | POA: Diagnosis not present

## 2022-02-02 DIAGNOSIS — N189 Chronic kidney disease, unspecified: Secondary | ICD-10-CM | POA: Insufficient documentation

## 2022-02-02 LAB — COMPREHENSIVE METABOLIC PANEL
ALT: 11 U/L (ref 0–44)
AST: 14 U/L — ABNORMAL LOW (ref 15–41)
Albumin: 3.6 g/dL (ref 3.5–5.0)
Alkaline Phosphatase: 42 U/L (ref 38–126)
Anion gap: 5 (ref 5–15)
BUN: 25 mg/dL — ABNORMAL HIGH (ref 8–23)
CO2: 27 mmol/L (ref 22–32)
Calcium: 8.6 mg/dL — ABNORMAL LOW (ref 8.9–10.3)
Chloride: 106 mmol/L (ref 98–111)
Creatinine, Ser: 1.35 mg/dL — ABNORMAL HIGH (ref 0.44–1.00)
GFR, Estimated: 40 mL/min — ABNORMAL LOW (ref >60)
Glucose, Bld: 103 mg/dL — ABNORMAL HIGH (ref 70–99)
Potassium: 4.4 mmol/L (ref 3.5–5.1)
Sodium: 138 mmol/L (ref 135–145)
Total Bilirubin: 0.3 mg/dL (ref 0.3–1.2)
Total Protein: 6.6 g/dL (ref 6.5–8.1)

## 2022-02-02 LAB — IRON AND TIBC
Iron: 54 ug/dL (ref 28–170)
Saturation Ratios: 17 % (ref 10.4–31.8)
TIBC: 312 ug/dL (ref 250–450)
UIBC: 258 ug/dL

## 2022-02-02 LAB — CBC WITH DIFFERENTIAL/PLATELET
Abs Immature Granulocytes: 0.13 10*3/uL — ABNORMAL HIGH (ref 0.00–0.07)
Basophils Absolute: 0 10*3/uL (ref 0.0–0.1)
Basophils Relative: 1 %
Eosinophils Absolute: 0.4 10*3/uL (ref 0.0–0.5)
Eosinophils Relative: 5 %
HCT: 33.2 % — ABNORMAL LOW (ref 36.0–46.0)
Hemoglobin: 10.8 g/dL — ABNORMAL LOW (ref 12.0–15.0)
Immature Granulocytes: 2 %
Lymphocytes Relative: 22 %
Lymphs Abs: 1.9 10*3/uL (ref 0.7–4.0)
MCH: 29.2 pg (ref 26.0–34.0)
MCHC: 32.5 g/dL (ref 30.0–36.0)
MCV: 89.7 fL (ref 80.0–100.0)
Monocytes Absolute: 0.7 10*3/uL (ref 0.1–1.0)
Monocytes Relative: 8 %
Neutro Abs: 5.7 10*3/uL (ref 1.7–7.7)
Neutrophils Relative %: 62 %
Platelets: 311 10*3/uL (ref 150–400)
RBC: 3.7 MIL/uL — ABNORMAL LOW (ref 3.87–5.11)
RDW: 13 % (ref 11.5–15.5)
WBC: 8.9 10*3/uL (ref 4.0–10.5)
nRBC: 0 % (ref 0.0–0.2)

## 2022-02-02 LAB — RETIC PANEL
Immature Retic Fract: 12.6 % (ref 2.3–15.9)
RBC.: 3.71 MIL/uL — ABNORMAL LOW (ref 3.87–5.11)
Retic Count, Absolute: 56.8 10*3/uL (ref 19.0–186.0)
Retic Ct Pct: 1.5 % (ref 0.4–3.1)
Reticulocyte Hemoglobin: 32.6 pg (ref >27.9)

## 2022-02-02 LAB — FERRITIN: Ferritin: 40 ng/mL (ref 11–307)

## 2022-02-04 ENCOUNTER — Ambulatory Visit
Admission: RE | Admit: 2022-02-04 | Discharge: 2022-02-04 | Disposition: A | Discharge status: Home / Self Care | Payer: Medicare Other | Source: Ambulatory Visit | Attending: Oncology | Admitting: Oncology

## 2022-02-04 ENCOUNTER — Inpatient Hospital Stay (HOSPITAL_BASED_OUTPATIENT_CLINIC_OR_DEPARTMENT_OTHER): Payer: Medicare Other | Admitting: Oncology

## 2022-02-04 ENCOUNTER — Encounter: Payer: Self-pay | Admitting: Oncology

## 2022-02-04 VITALS — BP 145/61 | HR 69 | Temp 97.8°F | Resp 16 | Ht 63.0 in | Wt 199.8 lb

## 2022-02-04 DIAGNOSIS — D631 Anemia in chronic kidney disease: Secondary | ICD-10-CM

## 2022-02-04 DIAGNOSIS — R059 Cough, unspecified: Secondary | ICD-10-CM | POA: Diagnosis not present

## 2022-02-04 DIAGNOSIS — Z1231 Encounter for screening mammogram for malignant neoplasm of breast: Secondary | ICD-10-CM

## 2022-02-04 DIAGNOSIS — N189 Chronic kidney disease, unspecified: Secondary | ICD-10-CM | POA: Diagnosis not present

## 2022-02-04 DIAGNOSIS — C50412 Malignant neoplasm of upper-outer quadrant of left female breast: Secondary | ICD-10-CM | POA: Diagnosis not present

## 2022-02-04 DIAGNOSIS — Z170 Estrogen receptor positive status [ER+]: Secondary | ICD-10-CM | POA: Diagnosis not present

## 2022-02-04 DIAGNOSIS — N1832 Chronic kidney disease, stage 3b: Secondary | ICD-10-CM | POA: Diagnosis not present

## 2022-02-04 DIAGNOSIS — R051 Acute cough: Secondary | ICD-10-CM

## 2022-02-04 NOTE — Assessment & Plan Note (Addendum)
Labs are reviewed and discussed with patient. Hb slightly decreased.  Currently level is above 10, monitor
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
## 2022-02-04 NOTE — Assessment & Plan Note (Addendum)
#  Stage IA breast cancer, declined adjuvant aromatase inhibitor.  Clinically she is doing well. Labs are reviewed and discussed with patient. Continue annual mammogram.  obtain next mammogram in March 2024.

## 2022-02-04 NOTE — Assessment & Plan Note (Signed)
Check CXR  

## 2022-02-10 ENCOUNTER — Encounter: Payer: Self-pay | Admitting: Oncology

## 2022-02-19 ENCOUNTER — Telehealth: Payer: Self-pay

## 2022-02-19 NOTE — Chronic Care Management (AMB) (Signed)
Chronic Care Management Pharmacy Assistant   Name: Ann Cummings  MRN: [id number] DOB: 06/28/1940   Reason for Encounter: Reminder Call    Medications: Outpatient Encounter Medications as of 02/19/2022  Medication Sig   acetaminophen (TYLENOL) 500 MG tablet Take 500 mg by mouth every 6 (six) hours as needed (FOR PAIN.).    albuterol (VENTOLIN HFA) 108 (90 Base) MCG/ACT inhaler Inhale 2 puffs into the lungs every 4 (four) hours as needed for wheezing.   amLODipine (NORVASC) 10 MG tablet Take 1 tablet (10 mg total) by mouth daily.   Bacillus Coagulans-Inulin (PROBIOTIC) 1-250 BILLION-MG CAPS Take by mouth.   benazepril (LOTENSIN) 20 MG tablet Take 1 tablet (20 mg total) by mouth daily.   Cholecalciferol 25 MCG (1000 UT) capsule Take 1,000 Units by mouth.   citalopram (CELEXA) 40 MG tablet Take 1 tablet (40 mg total) by mouth at bedtime.   cyclobenzaprine (FLEXERIL) 10 MG tablet TAKE 1/2 TO 1 PILL UP TO 3 TIMES DAILY AS NEEDED FOR HEADACHE OR MUSCLE SPASM, CAUTION OF SEDATION   dicyclomine (BENTYL) 20 MG tablet Take 1 tablet (20 mg total) by mouth 2 (two) times daily before a meal.   diphenhydrAMINE-zinc acetate (BENADRYL ITCH STOPPING) cream Apply 1 application. topically daily as needed for itching.   fexofenadine (ALLEGRA) 180 MG tablet Take 180 mg by mouth daily as needed.   furosemide (LASIX) 20 MG tablet Take 1 tablet (20 mg total) by mouth daily.   glucose blood (ONETOUCH VERIO) test strip Use one daily to check blood sugar (Dx. Code:  R73.03)   hydrocortisone cream 1 % Apply 1 application topically 2 (two) times daily.   Iron-Vitamin C (VITRON-C PO) Take 1 capsule by mouth daily. (Patient not taking: Reported on 02/04/2022)   Ketotifen Fumarate (ALAWAY OP) Place 1-2 drops into both eyes 3 (three) times daily as needed (for eye irritation.).    Lancets (ONETOUCH DELICA PLUS [id number]) MISC CHECK BLOOD SUGAR EVERY DAY   loperamide (IMODIUM) 2 MG capsule Take 2-4 mg by mouth 4  (four) times daily as needed for diarrhea or loose stools.   meclizine (ANTIVERT) 25 MG tablet Take 25 mg by mouth 3 (three) times daily as needed (for dizziness/vertigo).   neomycin-bacitracin-polymyxin (NEOSPORIN) ointment Apply 1 application. topically every 12 (twelve) hours.   omeprazole (PRILOSEC) 20 MG capsule Take 1 capsule (20 mg total) by mouth daily.   ONETOUCH DELICA LANCETS FINE MISC 1 each by Other route as directed. CHECK BLOOD GLUCOSE ONCE DAILY AND AS DIRECTED FOR DIABETES MELLITIS   rosuvastatin (CRESTOR) 10 MG tablet TAKE 1/2 TABLET BY MOUTH EVERY DAY   sodium chloride (MURO 128) 5 % ophthalmic solution Place 1 drop into both eyes 4 (four) times daily as needed for eye irritation.    Triamcinolone Acetonide (NASACORT ALLERGY 24HR NA) Place 1 spray into the nose daily as needed (for allergies.). (Patient not taking: Reported on 02/04/2022)   triamcinolone cream (KENALOG) 0.1 % Apply 1 application topically 2 (two) times daily.   vitamin B-12 (CYANOCOBALAMIN) 1000 MCG tablet Take 1,000 mcg by mouth at bedtime.   No facility-administered encounter medications on file as of 02/19/2022.    Ann Cummings was contacted to remind of upcoming telephone visit with Charlene Brooke on 02/24/22 at 3:00. Patient was reminded to have any blood glucose and blood pressure readings available for review at appointment.   Answering machine would not accept my call   CCM referral has been placed prior to visit?  No   Star Rating Drugs: Medication:  Last Fill: Day Supply Benazepril '20mg'$  12/24/21  90 Rosuvastatin '10mg'$  01/08/22 [place], CPP notified  Avel Sensor, [place]  [phone]

## 2022-02-24 ENCOUNTER — Ambulatory Visit: Payer: Medicare Other | Admitting: Pharmacist

## 2022-02-24 DIAGNOSIS — J301 Allergic rhinitis due to pollen: Secondary | ICD-10-CM

## 2022-02-24 DIAGNOSIS — I10 Essential (primary) hypertension: Secondary | ICD-10-CM

## 2022-02-24 DIAGNOSIS — N1832 Chronic kidney disease, stage 3b: Secondary | ICD-10-CM

## 2022-02-24 DIAGNOSIS — G43109 Migraine with aura, not intractable, without status migrainosus: Secondary | ICD-10-CM

## 2022-02-24 DIAGNOSIS — E7800 Pure hypercholesterolemia, unspecified: Secondary | ICD-10-CM

## 2022-02-24 NOTE — Progress Notes (Signed)
Chronic Care Management Pharmacy Note  02/24/2022 Name:  Ann Cummings MRN:  [id number] DOB:  Jan 05, 1941  Summary: CCM F/U visit -HTN: Pt has new BP monitor, though not checking often. BP was 130s/60s when she was checking frequently in July/August.   Recommendations/Changes made from today's visit: -Advised to monitor BP 1-2 times per week; contact PCP if consistently > 150/90 -Advised to schedule PCP annual visit (last seen 03/2021)  Plan: -[place] will call patient 3 months for BP update -Pharmacist follow up PRN -AWV w/LPN 08/24/77   Subjective: Ann Cummings is an 81 y.o. year old female who is a primary patient of Tower, [name], MD.  The CCM team was consulted for assistance with disease management and care coordination needs.    Engaged with patient by telephone for follow up visit in response to provider referral for pharmacy case management and/or care coordination services.   Consent to Services:  The patient was given information about Chronic Care Management services, agreed to services, and gave verbal consent prior to initiation of services.  Please see initial visit note for detailed documentation.   Patient Care Team: Tower, [name], MD as PCP - General Ninfa Linden Lind Guest, MD as Consulting Physician (Orthopedic Surgery) Earnie Larsson, MD as Consulting Physician (Neurosurgery) Oneta Rack, MD as Consulting Physician (Dermatology) Earlie Server, MD as Consulting Physician (Hematology and Oncology) Charlton Haws, [facility] as Pharmacist (Pharmacist)   01/03/21 initial visit: Patient came with her daughter [name] today. Patient has lived in [place] for 30 years. She is retired and spends most of her time at home watching TV. She does not drive due to migraines.  Recent office visits: 03/21/21 Dr Glori Bickers OV: fatigue/brain fog; recheck BP when less anxious; her home cuff runs high, stop using it. Discuss migraines at f/u. F/U after Jan 1 for  AWV.  Recent consult visits: 02/04/22 Dr Tasia Catchings (Heme/onc): f/u breast ca - stable. Acute cough - xray wnl.  08/01/21 NP Santa Lighter Zenia Resides (Heme/onc): f/u breast cancer, IDA. Declined aromatase inhibitor. Mammogram no evidence of recurrence. HGB at baseline.  05/08/21 Dr Iona Hansen (opthalomology):  07/30/20 Dr. Edythe Clarity, Oncology; f/u breast cancer; ordered labs.   Hospital visits: None in previous 6 months   Objective:  Lab Results  Component Value Date   CREATININE 1.35 (H) 02/02/2022   BUN 25 (H) 02/02/2022   GFR 42.01 (L) 03/21/2021   GFRNONAA 40 (L) 02/02/2022   GFRAA 46 (L) 01/17/2020   NA 138 02/02/2022   K 4.4 02/02/2022   CALCIUM 8.6 (L) 02/02/2022   CO2 27 02/02/2022   GLUCOSE 103 (H) 02/02/2022    Lab Results  Component Value Date/Time   HGBA1C 6.0 03/21/2021 10:54 AM   HGBA1C 6.1 02/27/2020 11:14 AM   GFR 42.01 (L) 03/21/2021 10:54 AM   GFR 38.71 (L) 02/23/2019 08:59 AM   MICROALBUR 1.6 04/24/2009 08:42 AM   MICROALBUR 9.6 (H) 04/17/2008 10:08 AM    Last diabetic Eye exam:  Lab Results  Component Value Date/Time   HMDIABEYEEXA Retinopathy (A) 05/07/2020 12:00 AM    Last diabetic Foot exam:  Lab Results  Component Value Date/Time   HMDIABFOOTEX yes 08/21/2009 12:00 AM     Lab Results  Component Value Date   CHOL 134 03/21/2021   HDL 39.40 03/21/2021   LDLCALC 61 01/15/2015   LDLDIRECT 61.0 03/21/2021   TRIG 205.0 (H) 03/21/2021   CHOLHDL 3 03/21/2021       Latest Ref Rng & Units  02/02/2022   10:50 AM 07/28/2021    1:35 PM 03/21/2021   10:54 AM  Hepatic Function  Total Protein 6.5 - 8.1 g/dL 6.6  6.7  6.3   Albumin 3.5 - 5.0 g/dL 3.6  3.7  4.1   AST 15 - 41 U/L '14  13  12   ' ALT 0 - 44 U/L '11  11  8   ' Alk Phosphatase 38 - 126 U/L 42  45  41   Total Bilirubin 0.3 - 1.2 mg/dL 0.3  0.6  0.4     Lab Results  Component Value Date/Time   TSH 1.32 03/21/2021 10:54 AM   TSH 1.78 02/27/2020 11:14 AM       Latest Ref Rng & Units 02/02/2022   10:50 AM  07/28/2021    1:35 PM 03/21/2021   10:54 AM  CBC  WBC 4.0 - 10.5 K/uL 8.9  11.6  11.5   Hemoglobin 12.0 - 15.0 g/dL 10.8  11.0  10.7   Hematocrit 36.0 - 46.0 % 33.2  34.2  32.3   Platelets 150 - 400 K/uL 311  322  327.0     Lab Results  Component Value Date/Time   VD25OH 21 (L) 05/16/2009 10:15 PM    Clinical ASCVD: No  The 10-year ASCVD risk score (Arnett DK, et al., 2019) is: 59.2%   Values used to calculate the score:     Age: 81 years     Sex: Female     Is Non-Hispanic African American: No     Diabetic: Yes     Tobacco smoker: No     Systolic Blood Pressure: 122 mmHg     Is BP treated: Yes     HDL Cholesterol: 39.4 mg/dL     Total Cholesterol: 134 mg/dL       06/03/2021   12:06 PM 02/27/2020   11:54 AM 02/28/2019   10:49 AM  Depression screen PHQ 2/9  Decreased Interest 0 1 0  Down, Depressed, Hopeless 1 0 0  PHQ - 2 Score 1 1 0  Altered sleeping  1   Tired, decreased energy  2   Change in appetite  1   Feeling bad or failure about yourself   0   Trouble concentrating  0   Moving slowly or fidgety/restless  0   Suicidal thoughts  0   PHQ-9 Score  5   Difficult doing work/chores  Not difficult at all     Social History   Tobacco Use  Smoking Status Never  Smokeless Tobacco Never   BP Readings from Last 3 Encounters:  02/04/22 (!) 145/61  08/01/21 (!) 143/57  06/13/21 (!) 149/61   Pulse Readings from Last 3 Encounters:  02/04/22 69  08/01/21 63  06/13/21 66   Wt Readings from Last 3 Encounters:  02/04/22 199 lb 12.8 oz (90.6 kg)  08/01/21 197 lb 6.4 oz (89.5 kg)  06/13/21 200 lb (90.7 kg)   BMI Readings from Last 3 Encounters:  02/04/22 35.39 kg/m  08/01/21 34.97 kg/m  06/13/21 35.43 kg/m    Assessment/Interventions: Review of patient past medical history, allergies, medications, health status, including review of consultants reports, laboratory and other test data, was performed as part of comprehensive evaluation and provision of chronic  care management services.   SDOH:  (Social Determinants of Health) assessments and interventions performed: No - completed at [facility] 05/2021 SDOH Interventions    Flowsheet Row Clinical Support from 02/10/2017 in [place] at [facility]  SDOH Interventions   Depression Interventions/Treatment  --  [referral to PCP]      SDOH Screenings   Food Insecurity: No Food Insecurity (06/03/2021)  Housing: Low Risk  (06/03/2021)  Transportation Needs: No Transportation Needs (06/03/2021)  Alcohol Screen: Low Risk  (06/03/2021)  Depression (PHQ2-9): Low Risk  (06/03/2021)  Financial Resource Strain: Low Risk  (06/03/2021)  Physical Activity: Inactive (06/03/2021)  Social Connections: Moderately Isolated (06/03/2021)  Stress: No Stress Concern Present (06/03/2021)  Tobacco Use: Low Risk  (02/04/2022)    CCM Care Plan  Allergies  Allergen Reactions   Calcium     REACTION: severe constipation   Cetirizine Hcl     REACTION: reaction not known   Codeine     Per pt elevated blood pressure and caused haedache   Gabapentin     REACTION: Confussion   Mometasone Furoate     REACTION: not effective   Prednisone Diarrhea    Stomach swollen and IBS   Ropinirole Hydrochloride     REACTION: lightheaded and felt like going to pass out   Ampicillin Rash    Rash all over body    Medications Reviewed Today     Reviewed by Lorrin Jackson, CMA (Certified Medical Assistant) on 02/04/22 at 1323  Med List Status: <None>   Medication Order Taking? Sig Documenting Provider Last Dose Status Informant  acetaminophen (TYLENOL) 500 MG tablet [id number] Yes Take 500 mg by mouth every 6 (six) hours as needed (FOR PAIN.).  [provider] Taking Active Self  albuterol (VENTOLIN HFA) 108 (90 Base) MCG/ACT inhaler [id number] Yes Inhale 2 puffs into the lungs every 4 (four) hours as needed for wheezing. Tower, [name], MD Taking Active   amLODipine (NORVASC) 10 MG tablet [id number] Yes Take 1 tablet (10 mg  total) by mouth daily. Tower, [name], MD Taking Active   Bacillus Coagulans-Inulin (PROBIOTIC) 1-250 BILLION-MG CAPS [id number] Yes Take by mouth. [provider] Taking Active   benazepril (LOTENSIN) 20 MG tablet [id number] Yes Take 1 tablet (20 mg total) by mouth daily. Tower, [name], MD Taking Active   Cholecalciferol 25 MCG (1000 UT) capsule [id number] Yes Take 1,000 Units by mouth. [provider] Taking Active   citalopram (CELEXA) 40 MG tablet [id number] Yes Take 1 tablet (40 mg total) by mouth at bedtime. Tower, [name], MD Taking Active   cyclobenzaprine (FLEXERIL) 10 MG tablet [id number] Yes TAKE 1/2 TO 1 PILL UP TO 3 TIMES DAILY AS NEEDED FOR HEADACHE OR MUSCLE SPASM, CAUTION OF SEDATION Tower, [name], MD Taking Active   dicyclomine (BENTYL) 20 MG tablet [id number] Yes Take 1 tablet (20 mg total) by mouth 2 (two) times daily before a meal. Tower, [name], MD Taking Active   diphenhydrAMINE-zinc acetate (BENADRYL ITCH STOPPING) cream [id number] Yes Apply 1 application. topically daily as needed for itching. [provider] Taking Active   fexofenadine (ALLEGRA) 180 MG tablet [id number] Yes Take 180 mg by mouth daily as needed. [provider] Taking Active   furosemide (LASIX) 20 MG tablet [id number] Yes Take 1 tablet (20 mg total) by mouth daily. Tower, [name], MD Taking Active   glucose blood (ONETOUCH VERIO) test strip [id number] Yes Use one daily to check blood sugar (Dx. Code:  R73.03) Tower, [name], MD Taking Active   hydrocortisone cream 1 % [id number] Yes Apply 1 application topically 2 (two) times daily. [provider] Taking Active   Iron-Vitamin C (VITRON-C PO) [id number] No Take 1 capsule by  mouth daily.  Patient not taking: Reported on 02/04/2022   [provider] Not Taking Active   Ketotifen Fumarate (ALAWAY OP) [id number] Yes Place 1-2 drops into both eyes 3 (three) times daily as needed (for eye irritation.).  [provider] Taking Active Self  Lancets (ONETOUCH DELICA PLUS [id number]) [place] [id number] Yes CHECK BLOOD SUGAR EVERY DAY Tower, [name], MD Taking Active   loperamide (IMODIUM) 2 MG capsule [id number] Yes Take 2-4 mg by mouth 4 (four) times daily as needed for diarrhea or loose stools. [provider] Taking Active Self  meclizine (ANTIVERT) 25 MG tablet [id number] Yes Take 25 mg by mouth 3 (three) times daily as needed (for dizziness/vertigo). [provider] Taking Active Self  neomycin-bacitracin-polymyxin (NEOSPORIN) ointment [id number] Yes Apply 1 application. topically every 12 (twelve) hours. [provider] Taking Active   omeprazole (PRILOSEC) 20 MG capsule [id number] Yes Take 1 capsule (20 mg total) by mouth daily. Tower, [name], MD Taking Active   [facility] LANCETS FINE [place] [id number] Yes 1 each by Other route as directed. CHECK BLOOD GLUCOSE ONCE DAILY AND AS DIRECTED FOR DIABETES MELLITIS Tower, [name], MD Taking Active Self  rosuvastatin (CRESTOR) 10 MG tablet [id number] Yes TAKE 1/2 TABLET BY MOUTH EVERY DAY Tower, [name], MD Taking Active   sodium chloride (MURO 128) 5 % ophthalmic solution [id number] Yes Place 1 drop into both eyes 4 (four) times daily as needed for eye irritation.  [provider] Taking Active Self  Triamcinolone Acetonide (NASACORT ALLERGY 24HR NA) [id number] No Place 1 spray into the nose daily as needed (for allergies.).  Patient not taking: Reported on 02/04/2022   [provider] Not Taking Active Self           Med Note Tamala Julian, Sharlette Dense   Wed Apr 05, 2019  1:04 PM)    triamcinolone cream (KENALOG) 0.1 % [id number] Yes Apply 1 application topically 2 (two) times daily. Tower, [name], MD Taking Active   vitamin B-12 (CYANOCOBALAMIN) 1000 MCG tablet [id number] Yes Take 1,000 mcg by mouth at bedtime. 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/16/2020   Anemia due to stage 3a chronic kidney disease ([place]) 07/30/2020   Chronic cough 02/27/2020   Dysphagia 02/27/2020   Migraine with aura 02/22/2018   Goals of care, counseling/discussion 10/14/2017   Malignant neoplasm of upper-outer quadrant of left breast in female, estrogen receptor positive ([place]) 08/02/2017   Intertrigo 02/19/2016   Class 2 obesity due to excess calories with body mass index (BMI) of 35.0 to 35.9 in adult 08/02/2015   Encounter for Medicare annual wellness exam 01/09/2014   Chronic kidney disease, stage 3b ([place]) 01/09/2014   Eczema 07/12/2013   Pedal edema 01/06/2013   Allergic rhinitis 08/21/2009   Iron deficiency anemia 07/09/2009   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   History of TV adenoma of colon 06/07/2009   OBSTRUCTIVE SLEEP APNEA 05/31/2009   ANEMIA, B12 DEFICIENCY 05/24/2009   Depression 05/15/2009   MEMORY LOSS 04/30/2009   Prediabetes 10/26/2007   LIPOMA, BACK 10/01/2006   Hyperlipidemia 10/01/2006   CARPAL TUNNEL SYNDROME 10/01/2006   Essential hypertension 10/01/2006   IBS 10/01/2006   FIBROCYSTIC BREAST DISEASE 10/01/2006  OSTEOARTHRITIS 10/01/2006   SPINAL STENOSIS 10/01/2006   BACK PAIN, CHRONIC 10/01/2006   MIGRAINES, HX OF 10/01/2006    Immunization History  Administered Date(s) Administered   Fluad Quad(high Dose 65+) 02/28/2019, 02/27/2020   Influenza Whole 02/25/2005, 04/30/2009, 03/17/2010   Influenza,inj,Quad PF,6+ Mos 02/10/2013, 01/09/2014, 02/01/2015, 02/03/2016, 02/10/2017, 03/03/2018, 03/21/2021   PFIZER(Purple Top)SARS-COV-2 Vaccination 11/02/2019, 11/23/2019   Pneumococcal Conjugate-13 01/09/2014   Pneumococcal Polysaccharide-23 04/23/2008   Td 04/13/1995, 10/14/2006    Conditions to be addressed/monitored:  Hypertension, Hyperlipidemia, and Chronic  Kidney Disease  Care Plan : [place]  Updates made by Charlton Haws, [place] since 02/24/2022 12:00 AM     Problem: Hypertension, Hyperlipidemia, and Chronic Kidney Disease   Priority: High     Long-Range Goal: Disease management   Start Date: 01/03/2021  Expected End Date: 02/24/2022  This Visit's Progress: On track  Recent Progress: Not on track  Priority: High  Note:   Current Barriers:  None identified  Pharmacist Clinical Goal(s):  Patient will contact provider office for questions/concerns as evidenced notation of same in electronic health record through collaboration with PharmD and provider.   Interventions: 1:1 collaboration with Tower, [name], MD regarding development and update of comprehensive plan of care as evidenced by provider attestation and co-signature Inter-disciplinary care team collaboration (see longitudinal plan of care) Comprehensive medication review performed; medication list updated in electronic medical record  Hypertension / CKD stage 3b (BP goal <140/90) -Controlled -  pt has new BP monitor, though not checking often -Current home readings: 138/67, 135/63 (from July/August 2023) -Current treatment: Amlodipine 10 mg daily AM - Appropriate, Effective, Safe, Accessible Benazepril 20 mg daily AM - Appropriate, Effective, Safe, Accessible Furosemide 20 mg daily - Appropriate, Effective, Safe, Accessible -Medications previously tried: atenolol, triamterene/htctz -Educated on BP goals and benefits of medications for prevention of heart attack, stroke and kidney damage; Importance of home blood pressure monitoring; Proper BP monitoring technique; -Counseled to monitor BP at home 1-2x weekly -Recommend to continue current medication  Hyperlipidemia: (LDL goal < 100) -Controlled - LDL 66 (03/2021) at goal, TRIG elevated at 272; pt endorses compliance with statin and denies side effects -Current treatment: Rosuvastatin 10 mg - 1/2 tab  daily HS - Appropriate, Effective, Safe, Accessible -Educated on Cholesterol goals; Benefits of statin for ASCVD risk reduction -Recommended to continue current medication  IBS / GERD (Goal: manage symptoms) -Controlled - pt reports using loperamide before she leaves the house "just in case"; she has seen GI specialist previously and has been told to forego further colonoscopies; she denies issues with GERD -Current treatment  Dicyclomine 20 mg BID -Appropriate, Effective, Safe, Accessible Loperamide 2 mg PRN -Appropriate, Effective, Safe, Accessible Simethicone PRN -Appropriate, Effective, Safe, Accessible Omeprazole 20 mg daily - Appropriate, Effective, Safe, Accessible Probiotic -Appropriate, Effective, Safe, Accessible -Recommended to continue current medication  Migraine headache (Goal: manage symptoms) -Improving - previously pt reports near daily headaches;  she reports these have improved over last month or two; her migraines have previously affected her motor skills/balance -Current treatment  Cyclobenzaprine 10 mg PRN - Appropriate, Effective, Safe, Accessible Tylenol 500 mg PRN - Appropriate, Effective, Safe, Accessible -Medications previously tried: tramadol, gabapentin, atenolol -Previously Educated on migraine prevention options - beta blockers, amitriptyline, topiramate are first line option; pt has not tolerated atenolol due to low pulse; would avoid amitriptyline due to anticholinergic properties and age; would avoid topiramate due to potential to worsen cognitive issues; CGRP antagonists would be a good option -Can  consider CGRP-antagonist Tree surgeon) for migraine prevention if desired  Allergic rhinitis (Goal: manage symptoms) -Controlled - pt is off of Benadryl after discussion at last visit; she is now using Allegra and Benadryl only rarely for severe symptoms -Current treatment  Nasacort nasal spray PRN Albuterol HFA prn Ketotifen (Alaway) eye drops Systane eye  drops Coricidin OTC Allegra daily Benadryl PRN -Discussed reserving Benadryl for infrequent, PRN use. Advised to use Allegra as daily maintenance   Patient Goals/Self-Care Activities Patient will:  - take medications as prescribed focus on medication adherence by routine check blood pressure daily, document, and provide at future appointments -Get a new BP monitor (Omron brand is a good one) -Let PCP know if you want to pursue injections to prevent migraines     Medication Assistance: None required.  Patient affirms current coverage meets needs.  Compliance/Adherence/Medication fill history: Care Gaps: Hep C screening Colonoscopy (due 04/24/19) - GI doctor has advised against further colonoscopies  Star-Rating Drugs: Benazepril 74m  - PDC 94% Rosuvastatin 182m- PDC 99%  Medication Access: Within the past 30 days, how often has patient missed a dose of medication? 0 Is a pillbox or other method used to improve adherence? Yes  Factors that may affect medication adherence? no barriers identified Are meds synced by current pharmacy? No  Are meds delivered by current pharmacy? No  Does patient experience delays in picking up medications due to transportation concerns? No   [facility] Services Reviewed: Is patient disadvantaged to use [facility]?: Yes  Current Rx [place] plan: [id number]: [phone]: [id number] [id number]: [phone]: [phone] services reviewed with patient today?: No  Patient requests to transfer care to [facility]?: No  Reason patient declined to change pharmacies: Loyalty to other pharmacy/Patient preference   Care Plan and Follow Up Patient Decision:  Patient agrees to  Care Plan and Follow-up.  Plan: The patient has been provided with contact information for the care management team and has been advised to call with any health related questions or concerns.   LinCharlene BrookeharmD, BCA[facility] Centerinical Pharmacist [facility] [phone]

## 2022-02-24 NOTE — Patient Instructions (Signed)
Visit Information  Phone number for Pharmacist: [phone]   Goals Addressed   None     Care Plan : [place]  Updates made by Charlton Haws, [place] since 02/24/2022 12:00 AM     Problem: Hypertension, Hyperlipidemia, and Chronic Kidney Disease   Priority: High     Long-Range Goal: Disease management   Start Date: 01/03/2021  Expected End Date: 02/24/2022  This Visit's Progress: On track  Recent Progress: Not on track  Priority: High  Note:   Current Barriers:  None identified  Pharmacist Clinical Goal(s):  Patient will contact provider office for questions/concerns as evidenced notation of same in electronic health record through collaboration with PharmD and provider.   Interventions: 1:1 collaboration with [place], [name], MD regarding development and update of comprehensive plan of care as evidenced by provider attestation and co-signature Inter-disciplinary care team collaboration (see longitudinal plan of care) Comprehensive medication review performed; medication list updated in electronic medical record  Hypertension / CKD stage 3b (BP goal <140/90) -Controlled -  pt has new BP monitor, though not checking often -Current home readings: 138/67, 135/63 (from July/August 2023) -Current treatment: Amlodipine 10 mg daily AM - Appropriate, Effective, Safe, Accessible Benazepril 20 mg daily AM - Appropriate, Effective, Safe, Accessible Furosemide 20 mg daily - Appropriate, Effective, Safe, Accessible -Medications previously tried: atenolol, triamterene/htctz -Educated on BP goals and benefits of medications for prevention of heart attack, stroke and kidney damage; Importance of home blood pressure monitoring; Proper BP monitoring technique; -Counseled to monitor BP at home 1-2x weekly -Recommend to continue current medication  Hyperlipidemia: (LDL goal < 100) -Controlled - LDL 66 (03/2021) at goal, TRIG elevated at 272; pt endorses compliance with  statin and denies side effects -Current treatment: Rosuvastatin 10 mg - 1/2 tab daily HS - Appropriate, Effective, Safe, Accessible -Educated on Cholesterol goals; Benefits of statin for ASCVD risk reduction -Recommended to continue current medication  IBS / GERD (Goal: manage symptoms) -Controlled - pt reports using loperamide before she leaves the house "just in case"; she has seen GI specialist previously and has been told to forego further colonoscopies; she denies issues with GERD -Current treatment  Dicyclomine 20 mg BID -Appropriate, Effective, Safe, Accessible Loperamide 2 mg PRN -Appropriate, Effective, Safe, Accessible Simethicone PRN -Appropriate, Effective, Safe, Accessible Omeprazole 20 mg daily - Appropriate, Effective, Safe, Accessible Probiotic -Appropriate, Effective, Safe, Accessible -Recommended to continue current medication  Migraine headache (Goal: manage symptoms) -Improving - previously pt reports near daily headaches;  she reports these have improved over last month or two; her migraines have previously affected her motor skills/balance -Current treatment  Cyclobenzaprine 10 mg PRN - Appropriate, Effective, Safe, Accessible Tylenol 500 mg PRN - Appropriate, Effective, Safe, Accessible -Medications previously tried: tramadol, gabapentin, atenolol -Previously Educated on migraine prevention options - beta blockers, amitriptyline, topiramate are first line option; pt has not tolerated atenolol due to low pulse; would avoid amitriptyline due to anticholinergic properties and age; would avoid topiramate due to potential to worsen cognitive issues; CGRP antagonists would be a good option -Can consider CGRP-antagonist Tree surgeon) for migraine prevention if desired  Allergic rhinitis (Goal: manage symptoms) -Controlled - pt is off of Benadryl after discussion at last visit; she is now using Allegra and Benadryl only rarely for severe symptoms -Current treatment  Nasacort  nasal spray PRN Albuterol HFA prn Ketotifen (Alaway) eye drops Systane eye drops Coricidin OTC Allegra daily Benadryl PRN -Discussed reserving Benadryl for infrequent, PRN use. Advised to use Allegra as  daily maintenance   Patient Goals/Self-Care Activities Patient will:  - take medications as prescribed focus on medication adherence by routine check blood pressure daily, document, and provide at future appointments -Get a new BP monitor ([employer] brand is a good one) -Let PCP know if you want to pursue injections to prevent migraines      Patient verbalizes understanding of instructions and care plan provided today and agrees to view in [facility]. Active MyChart status and patient understanding of how to access instructions and care plan via MyChart confirmed with patient.      Charlene Brooke, PharmD, BCACP Clinical Pharmacist [facility] [phone]

## 2022-02-26 DIAGNOSIS — H34832 Tributary (branch) retinal vein occlusion, left eye, with macular edema: Secondary | ICD-10-CM | POA: Diagnosis not present

## 2022-02-26 DIAGNOSIS — H353211 Exudative age-related macular degeneration, right eye, with active choroidal neovascularization: Secondary | ICD-10-CM | POA: Diagnosis not present

## 2022-03-23 ENCOUNTER — Other Ambulatory Visit: Payer: Self-pay | Admitting: Family Medicine

## 2022-03-23 NOTE — Telephone Encounter (Signed)
Pt is due for her CPE (labs prior) or at least a med refill f/u appt., please schedule then route back to me to refill once appt has been made

## 2022-03-23 NOTE — Telephone Encounter (Signed)
Patient scheduled.

## 2022-03-25 ENCOUNTER — Other Ambulatory Visit: Payer: Self-pay | Admitting: Family Medicine

## 2022-04-06 ENCOUNTER — Telehealth: Payer: Self-pay | Admitting: Family Medicine

## 2022-04-06 DIAGNOSIS — E7800 Pure hypercholesterolemia, unspecified: Secondary | ICD-10-CM

## 2022-04-06 DIAGNOSIS — D518 Other vitamin B12 deficiency anemias: Secondary | ICD-10-CM

## 2022-04-06 DIAGNOSIS — D508 Other iron deficiency anemias: Secondary | ICD-10-CM

## 2022-04-06 DIAGNOSIS — I10 Essential (primary) hypertension: Secondary | ICD-10-CM

## 2022-04-06 DIAGNOSIS — Z79899 Other long term (current) drug therapy: Secondary | ICD-10-CM

## 2022-04-06 DIAGNOSIS — R7303 Prediabetes: Secondary | ICD-10-CM

## 2022-04-06 NOTE — Telephone Encounter (Signed)
-----   Message from Velna Hatchet, RT sent at 03/30/2022  2:29 PM EST ----- Regarding: Tue 11/21 labs Patient is scheduled for cpx, please order future labs.  Thanks, Anda Kraft

## 2022-04-07 ENCOUNTER — Other Ambulatory Visit (INDEPENDENT_AMBULATORY_CARE_PROVIDER_SITE_OTHER): Payer: Medicare Other

## 2022-04-07 DIAGNOSIS — E7800 Pure hypercholesterolemia, unspecified: Secondary | ICD-10-CM | POA: Diagnosis not present

## 2022-04-07 DIAGNOSIS — I10 Essential (primary) hypertension: Secondary | ICD-10-CM

## 2022-04-07 DIAGNOSIS — Z79899 Other long term (current) drug therapy: Secondary | ICD-10-CM | POA: Diagnosis not present

## 2022-04-07 DIAGNOSIS — R7303 Prediabetes: Secondary | ICD-10-CM | POA: Diagnosis not present

## 2022-04-07 DIAGNOSIS — D518 Other vitamin B12 deficiency anemias: Secondary | ICD-10-CM

## 2022-04-07 LAB — CBC WITH DIFFERENTIAL/PLATELET
Basophils Absolute: 0.1 10*3/uL (ref 0.0–0.1)
Basophils Relative: 0.6 % (ref 0.0–3.0)
Eosinophils Absolute: 0.4 10*3/uL (ref 0.0–0.7)
Eosinophils Relative: 3.7 % (ref 0.0–5.0)
HCT: 34.5 % — ABNORMAL LOW (ref 36.0–46.0)
Hemoglobin: 11.4 g/dL — ABNORMAL LOW (ref 12.0–15.0)
Lymphocytes Relative: 16.2 % (ref 12.0–46.0)
Lymphs Abs: 1.6 10*3/uL (ref 0.7–4.0)
MCHC: 33.1 g/dL (ref 30.0–36.0)
MCV: 89.3 fl (ref 78.0–100.0)
Monocytes Absolute: 0.7 10*3/uL (ref 0.1–1.0)
Monocytes Relative: 7.6 % (ref 3.0–12.0)
Neutro Abs: 6.9 10*3/uL (ref 1.4–7.7)
Neutrophils Relative %: 71.9 % (ref 43.0–77.0)
Platelets: 351 10*3/uL (ref 150.0–400.0)
RBC: 3.86 Mil/uL — ABNORMAL LOW (ref 3.87–5.11)
RDW: 14.2 % (ref 11.5–15.5)
WBC: 9.7 10*3/uL (ref 4.0–10.5)

## 2022-04-07 LAB — LIPID PANEL
Cholesterol: 131 mg/dL (ref 0–200)
HDL: 47.1 mg/dL (ref >39.00)
LDL Cholesterol: 50 mg/dL (ref 0–99)
NonHDL: 84.01
Total CHOL/HDL Ratio: 3
Triglycerides: 169 mg/dL — ABNORMAL HIGH (ref 0.0–149.0)
VLDL: 33.8 mg/dL (ref 0.0–40.0)

## 2022-04-07 LAB — COMPREHENSIVE METABOLIC PANEL
ALT: 9 U/L (ref 0–35)
AST: 13 U/L (ref 0–37)
Albumin: 4 g/dL (ref 3.5–5.2)
Alkaline Phosphatase: 48 U/L (ref 39–117)
BUN: 29 mg/dL — ABNORMAL HIGH (ref 6–23)
CO2: 26 mEq/L (ref 19–32)
Calcium: 9.2 mg/dL (ref 8.4–10.5)
Chloride: 103 mEq/L (ref 96–112)
Creatinine, Ser: 1.37 mg/dL — ABNORMAL HIGH (ref 0.40–1.20)
GFR: 36.64 mL/min — ABNORMAL LOW (ref >60.00)
Glucose, Bld: 89 mg/dL (ref 70–99)
Potassium: 4.7 mEq/L (ref 3.5–5.1)
Sodium: 137 mEq/L (ref 135–145)
Total Bilirubin: 0.3 mg/dL (ref 0.2–1.2)
Total Protein: 6.3 g/dL (ref 6.0–8.3)

## 2022-04-07 LAB — TSH: TSH: 1.99 u[IU]/mL (ref 0.35–5.50)

## 2022-04-07 LAB — HEMOGLOBIN A1C: Hgb A1c MFr Bld: 6.2 % (ref 4.6–6.5)

## 2022-04-07 LAB — VITAMIN B12: Vitamin B-12: 1234 pg/mL — ABNORMAL HIGH (ref 211–911)

## 2022-04-14 ENCOUNTER — Ambulatory Visit (INDEPENDENT_AMBULATORY_CARE_PROVIDER_SITE_OTHER): Payer: Medicare Other | Admitting: Family Medicine

## 2022-04-14 ENCOUNTER — Encounter: Payer: Self-pay | Admitting: Family Medicine

## 2022-04-14 VITALS — BP 146/58 | HR 86 | Temp 97.9°F | Ht 63.0 in | Wt 198.0 lb

## 2022-04-14 DIAGNOSIS — G43109 Migraine with aura, not intractable, without status migrainosus: Secondary | ICD-10-CM

## 2022-04-14 DIAGNOSIS — Z860101 Personal history of adenomatous and serrated colon polyps: Secondary | ICD-10-CM

## 2022-04-14 DIAGNOSIS — Z79899 Other long term (current) drug therapy: Secondary | ICD-10-CM | POA: Diagnosis not present

## 2022-04-14 DIAGNOSIS — Z86010 Personal history of colonic polyps: Secondary | ICD-10-CM

## 2022-04-14 DIAGNOSIS — E7800 Pure hypercholesterolemia, unspecified: Secondary | ICD-10-CM | POA: Diagnosis not present

## 2022-04-14 DIAGNOSIS — F3289 Other specified depressive episodes: Secondary | ICD-10-CM | POA: Diagnosis not present

## 2022-04-14 DIAGNOSIS — R7303 Prediabetes: Secondary | ICD-10-CM

## 2022-04-14 DIAGNOSIS — D518 Other vitamin B12 deficiency anemias: Secondary | ICD-10-CM

## 2022-04-14 DIAGNOSIS — H353230 Exudative age-related macular degeneration, bilateral, stage unspecified: Secondary | ICD-10-CM | POA: Diagnosis not present

## 2022-04-14 DIAGNOSIS — N1832 Chronic kidney disease, stage 3b: Secondary | ICD-10-CM

## 2022-04-14 DIAGNOSIS — I10 Essential (primary) hypertension: Secondary | ICD-10-CM | POA: Diagnosis not present

## 2022-04-14 DIAGNOSIS — D631 Anemia in chronic kidney disease: Secondary | ICD-10-CM

## 2022-04-14 DIAGNOSIS — Z6835 Body mass index (BMI) 35.0-35.9, adult: Secondary | ICD-10-CM

## 2022-04-14 DIAGNOSIS — L2082 Flexural eczema: Secondary | ICD-10-CM

## 2022-04-14 DIAGNOSIS — N1831 Chronic kidney disease, stage 3a: Secondary | ICD-10-CM | POA: Diagnosis not present

## 2022-04-14 MED ORDER — TRIAMCINOLONE ACETONIDE 0.1 % EX CREA: 1.0000 | TOPICAL_CREAM | Freq: Two times a day (BID) | CUTANEOUS | 3 refills | Status: DC

## 2022-04-14 MED ORDER — LOSARTAN POTASSIUM 100 MG PO TABS: 100.0000 mg | ORAL_TABLET | Freq: Every day | ORAL | 3 refills | Status: DC

## 2022-04-14 NOTE — Assessment & Plan Note (Signed)
Disc goals for lipids and reasons to control them Rev last labs with pt Rev low sat fat diet in detail Some imp in LDL and HDL  Urged to continue crestor 10 mg daily  Also diet control

## 2022-04-14 NOTE — Assessment & Plan Note (Signed)
Discussed how this problem influences overall health and the risks it imposes  Reviewed plan for weight loss with lower calorie diet (via better food choices and also portion control or program like weight watchers) and exercise building up to or more than 30 minutes 5 days per week including some aerobic activity    

## 2022-04-14 NOTE — Assessment & Plan Note (Signed)
Worse lately  Some psoriasis issues also  Refilled triamcinolone Pt will schedule derm f/u

## 2022-04-14 NOTE — Assessment & Plan Note (Signed)
Better bp control may help prevent more headaches

## 2022-04-14 NOTE — Assessment & Plan Note (Signed)
Gfr down to 36.6 Strongly enc water intake   Suspect hydration is not optimal   Enc to drink more water  F/u planned 2-4 wk  Also change ace to arb and inc dose for bp

## 2022-04-14 NOTE — Assessment & Plan Note (Signed)
Stable with citalopram

## 2022-04-14 NOTE — Assessment & Plan Note (Signed)
Lab Results  Component Value Date   VITAMINB12 1,234 (H) 04/07/2022   Will cut suppl to 500 mcg daily

## 2022-04-14 NOTE — Assessment & Plan Note (Signed)
Continues oph f/u

## 2022-04-14 NOTE — Assessment & Plan Note (Signed)
Stable Under care of hematology

## 2022-04-14 NOTE — Patient Instructions (Addendum)
Please make an effort to drink more fluids  Aim for 60 oz or more through the day -sip through the day   Think about adding exercise on the days you feel up to it  Walking/ pedaling or water programs are good  Also lifting/ strength training are important also   Call and schedule an appt with dermatology for your psoriasis and eczema   Cut your B12 to 500 mcg daily (from 1000)   To prevent diabetes  Try to get most of your carbohydrates from produce (with the exception of white potatoes)  Eat less bread/pasta/rice/snack foods/cereals/sweets and other items from the middle of the grocery store (processed carbs)   Hold the benazepril  We will change to losartan 100 mg daily  Your cough may improve  If any side effects let us know  If your blood pressure is too low let us know   Follow up here in 2-4 weeks  Make sure you take your medicines that day

## 2022-04-14 NOTE — Assessment & Plan Note (Signed)
Watching b12   Unable to stop so far
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
## 2022-04-14 NOTE — Assessment & Plan Note (Signed)
bp in fair control at this time  BP Readings from Last 1 Encounters:  04/14/22 (!) 146/58   No changes needed Most recent labs reviewed  Disc lifstyle change with low sodium diet and exercise  Plan to continue Amlodipine 10 mg daily  Lasix 20 mg daily  Will change benazapril to losartan 100 mg due to cough and inc bp  F/u in 2-4 wk Disc poss side eff- will monitor  Inst to call if bp is low

## 2022-04-14 NOTE — Assessment & Plan Note (Signed)
Lab Results  Component Value Date   HGBA1C 6.2 04/07/2022   This is up from 6.0 disc imp of low glycemic diet and wt loss to prevent DM2

## 2022-04-14 NOTE — Assessment & Plan Note (Signed)
Per pt was told no recall in 2020 due to age  Would not recommend cologuard due to h/o polyps

## 2022-04-22 DIAGNOSIS — H353211 Exudative age-related macular degeneration, right eye, with active choroidal neovascularization: Secondary | ICD-10-CM | POA: Diagnosis not present

## 2022-04-30 ENCOUNTER — Other Ambulatory Visit: Payer: Self-pay | Admitting: Family Medicine

## 2022-05-01 ENCOUNTER — Other Ambulatory Visit: Payer: Self-pay | Admitting: Family Medicine

## 2022-05-01 NOTE — Telephone Encounter (Signed)
F/u scheduled 05/12/22, last filled on 01/29/22 #180 tabs/ 0 refills

## 2022-05-05 ENCOUNTER — Telehealth: Payer: Self-pay | Admitting: Family Medicine

## 2022-05-05 ENCOUNTER — Encounter: Payer: Self-pay | Admitting: Oncology

## 2022-05-05 ENCOUNTER — Other Ambulatory Visit (HOSPITAL_COMMUNITY): Payer: Self-pay

## 2022-05-05 ENCOUNTER — Telehealth: Payer: Self-pay

## 2022-05-05 NOTE — Telephone Encounter (Signed)
Patient Advocate Encounter  Prior Authorization for Dicyclomine '20mg'$  has been approved.    PA# [id number] Effective dates: 05/05/2022 through 05/06/2023  Placed a call to the pharmacy and the pharmacist Gerald Stabs said the copay came back as $37.44 for a 180 tablets.

## 2022-05-05 NOTE — Telephone Encounter (Signed)
See separate phone note, PA has already been started/ submitted

## 2022-05-05 NOTE — Telephone Encounter (Signed)
Ann Cummings from [employer] called and stated the medication  dicyclomine dicyclomine (BENTYL) 20 MG tablet, Call back number [phone] Option 5

## 2022-05-05 NOTE — Telephone Encounter (Signed)
Pharmacy Patient Advocate Encounter   Received notification from [facility] that prior authorization for Dicyclomine HCl '20MG'$  tablets Form is required/requested.  Per Test Claim: High risk medication in the elderly; consider non-hrm alternative, prior needed   PA submitted on 05/05/22 to (ins) [employer] [place] Medicare Part D via CoverMyMeds Key  [id number] Status is pending

## 2022-05-05 NOTE — Telephone Encounter (Signed)
Patient Advocate Encounter  Prior Authorization for Dicyclomine '20mg'$  tabs has been approved.   Placed call to pharmacy to notify of the approval.  Please see previous chart notes.

## 2022-05-05 NOTE — Telephone Encounter (Signed)
Ann Cummings called in stated she need pre authorization to get RX  dicyclomine (BENTYL) 20 MG table  refill . Please advise  #  [phone]

## 2022-05-06 NOTE — Telephone Encounter (Signed)
Left VM letting pt know PA approved

## 2022-05-12 ENCOUNTER — Encounter: Payer: Self-pay | Admitting: Family Medicine

## 2022-05-12 ENCOUNTER — Ambulatory Visit (INDEPENDENT_AMBULATORY_CARE_PROVIDER_SITE_OTHER): Payer: Medicare Other | Admitting: Family Medicine

## 2022-05-12 VITALS — BP 152/65 | HR 75 | Temp 97.4°F | Ht 63.5 in | Wt 202.2 lb

## 2022-05-12 DIAGNOSIS — I10 Essential (primary) hypertension: Secondary | ICD-10-CM

## 2022-05-12 MED ORDER — METOPROLOL SUCCINATE ER 25 MG PO TB24: 25.0000 mg | ORAL_TABLET | Freq: Every day | ORAL | 3 refills | Status: DC
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
## 2022-05-12 NOTE — Assessment & Plan Note (Signed)
bp is not at goal here or at homg BP Readings from Last 1 Encounters:  05/12/22 (!) 152/65   Taking Amlodipine 10 mg daily  Lasix 20 mg daily  Losartan 100 mg daily (cough is improved off ace)   Disc options to add on  Metoprolol xl px 25 mg - with caution of low pulse (she will monitor and has smart watch)  She did have issues with atenolol in the past  Will f/u in about a month  Will review with pharmacist as well  Strongly enc to work on diet/fitness  DASH eating handout given Most recent labs reviewed  Disc lifstyle change with low sodium diet and exercise

## 2022-05-12 NOTE — Patient Instructions (Addendum)
Continue the current medicines   Start metoprolol xl 25 mg once daily  This is for blood pressure and headaches   Watch your blood pressure  Watch your pulse- if under 50 hold medicine and let us know  If any side effects please hold medicine and let us know   I will check in with the pharmacist as well   Try eating better  Less processed foods and salt   Try starting an exercise program /start slow to start    Follow up here in about a month

## 2022-05-21 ENCOUNTER — Ambulatory Visit: Payer: Medicare Other | Admitting: Pharmacist

## 2022-05-21 ENCOUNTER — Telehealth: Payer: Self-pay

## 2022-05-21 NOTE — Patient Instructions (Signed)
Visit Information  Phone number for Pharmacist: [phone]  Thank you for meeting with me to discuss your medications! Below is a summary of what we talked about during the visit:   Recommendations/Changes made from today's visit: -Advised to monitor BP daily until next PCP appt later this month; contact PCP if consistently > 150/90 or HR < 50  Follow up Plan: -Pharmacist follow up televisit scheduled for 6 weeks -PCP appt 06/12/22;  AWV w/ LPN 11/22/59   Charlene Brooke, PharmD, BCACP Clinical Pharmacist [facility] at [facility] [phone]

## 2022-05-21 NOTE — Progress Notes (Signed)
Care Management & Coordination Services Pharmacy Note  05/21/2022 Name:  Ann Cummings MRN:  [id number] DOB:  14-Jan-1941  Summary: F/U visit -HTN: BP 157/68, HR 53 on 05/19/22 at home per pt report; she does not have any other readings; Pt recently has changed BP medications per PCP, most recent addition was metoprolol, she started taking it about a week ago; she denies side effects or issues with metoprolol -Reviewed next options for BP control if needed: consider alternate more potent ARB (olmesartan or telmisartan) or hydralazine; caution with diuretic (thiazide or spironolactone) given GFR in 30s; caution further titration of metoprolol with low HR   Recommendations/Changes made from today's visit: -Reasonable to continue metoprolol for now. Advised to monitor BP and HR daily until next PCP appt later this month; contact PCP if consistently > 150/90 or HR < 50  Follow up plan: -Pharmacist follow up televisit scheduled for 6 weeks -PCP appt 06/12/22;  AWV w/ LPN 2/90/21    Subjective: Ann Cummings is an 82 y.o. year old female who is a primary patient of Tower, [name], MD.  The care coordination team was consulted for assistance with disease management and care coordination needs.    Engaged with patient by telephone for follow up visit.  Patient Care Team: Tower, [name], MD as PCP - General Ninfa Linden Lind Guest, MD as Consulting Physician (Orthopedic Surgery) Earnie Larsson, MD as Consulting Physician (Neurosurgery) Oneta Rack, MD as Consulting Physician (Dermatology) Earlie Server, MD as Consulting Physician (Hematology and Oncology) Charlton Haws, [facility] as Pharmacist (Pharmacist)  Recent office visits: 05/12/22 Dr Glori Bickers OV: f/u - BP 152/65. Rx metoprolol XL 25 mg.  04/14/22 Dr Glori Bickers OV: annual - BP 146/58. Change benazepril to losartan 100 mg d/t cough. F/u 2-4 wk.   Recent consult visits: 02/04/22 Dr Tasia Catchings (Heme/onc): f/u breast ca - stable. Acute cough - xray wnl.   08/01/21 NP Santa Lighter Zenia Resides (Heme/onc): f/u breast cancer, IDA. Declined aromatase inhibitor. Mammogram no evidence of recurrence. HGB at baseline.  Hospital visits: None in previous 6 months   Objective:  Lab Results  Component Value Date   CREATININE 1.37 (H) 04/07/2022   BUN 29 (H) 04/07/2022   GFR 36.64 (L) 04/07/2022   GFRNONAA 40 (L) 02/02/2022   GFRAA 46 (L) 01/17/2020   NA 137 04/07/2022   K 4.7 04/07/2022   CALCIUM 9.2 04/07/2022   CO2 26 04/07/2022   GLUCOSE 89 04/07/2022    Lab Results  Component Value Date/Time   HGBA1C 6.2 04/07/2022 08:18 AM   HGBA1C 6.0 03/21/2021 10:54 AM   GFR 36.64 (L) 04/07/2022 08:18 AM   GFR 42.01 (L) 03/21/2021 10:54 AM   MICROALBUR 1.6 04/24/2009 08:42 AM   MICROALBUR 9.6 (H) 04/17/2008 10:08 AM    Last diabetic Eye exam:  Lab Results  Component Value Date/Time   HMDIABEYEEXA Retinopathy (A) 05/07/2020 12:00 AM    Last diabetic Foot exam:  Lab Results  Component Value Date/Time   HMDIABFOOTEX yes 08/21/2009 12:00 AM     Lab Results  Component Value Date   CHOL 131 04/07/2022   HDL 47.10 04/07/2022   LDLCALC 50 04/07/2022   LDLDIRECT 61.0 03/21/2021   TRIG 169.0 (H) 04/07/2022   CHOLHDL 3 04/07/2022       Latest Ref Rng & Units 04/07/2022    8:18 AM 02/02/2022   10:50 AM 07/28/2021    1:35 PM  Hepatic Function  Total Protein 6.0 - 8.3 g/dL 6.3  6.6  6.7   Albumin 3.5 - 5.2 g/dL 4.0  3.6  3.7   AST 0 - 37 U/L _0 ALT 0 - 35 U/L _1 Alk Phosphatase 39 - 117 U/L 48  42  45   Total Bilirubin 0.2 - 1.2 mg/dL 0.3  0.3  0.6     Lab Results  Component Value Date/Time   TSH 1.99 04/07/2022 08:18 AM   TSH 1.32 03/21/2021 10:54 AM       Latest Ref Rng & Units 04/07/2022    8:18 AM 02/02/2022   10:50 AM 07/28/2021    1:35 PM  CBC  WBC 4.0 - 10.5 K/uL 9.7  8.9  11.6   Hemoglobin 12.0 - 15.0 g/dL 11.4  10.8  11.0   Hematocrit 36.0 - 46.0 % 34.5  33.2  34.2   Platelets 150.0 - 400.0 K/uL 351.0  311   322     Lab Results  Component Value Date/Time   VD25OH 21 (L) 05/16/2009 10:15 PM   VITAMINB12 1,234 (H) 04/07/2022 08:18 AM   VITAMINB12 819 03/21/2021 10:54 AM    Clinical ASCVD: No  The 10-year ASCVD risk score (Arnett DK, et al., 2019) is: 64%   Values used to calculate the score:     Age: 22 years     Sex: Female     Is Non-Hispanic African American: No     Diabetic: Yes     Tobacco smoker: No     Systolic Blood Pressure: 299 mmHg     Is BP treated: Yes     HDL Cholesterol: 47.1 mg/dL     Total Cholesterol: 131 mg/dL       06/03/2021   12:06 PM 02/27/2020   11:54 AM 02/28/2019   10:49 AM  Depression screen PHQ 2/9  Decreased Interest 0 1 0  Down, Depressed, Hopeless 1 0 0  PHQ - 2 Score 1 1 0  Altered sleeping  1   Tired, decreased energy  2   Change in appetite  1   Feeling bad or failure about yourself   0   Trouble concentrating  0   Moving slowly or fidgety/restless  0   Suicidal thoughts  0   PHQ-9 Score  5   Difficult doing work/chores  Not difficult at all      Social History   Tobacco Use  Smoking Status Never  Smokeless Tobacco Never   BP Readings from Last 3 Encounters:  05/12/22 (!) 152/65  04/14/22 (!) 146/58  02/04/22 (!) 145/61   Pulse Readings from Last 3 Encounters:  05/12/22 75  04/14/22 86  02/04/22 69   Wt Readings from Last 3 Encounters:  05/12/22 202 lb 4 oz (91.7 kg)  04/14/22 198 lb (89.8 kg)  02/04/22 199 lb 12.8 oz (90.6 kg)   BMI Readings from Last 3 Encounters:  05/12/22 35.26 kg/m  04/14/22 35.07 kg/m  02/04/22 35.39 kg/m    Allergies  Allergen Reactions   Calcium     REACTION: severe constipation   Cetirizine Hcl     REACTION: reaction not known   Codeine     Per pt elevated blood pressure and caused haedache   Gabapentin     REACTION: Confussion   Mometasone Furoate     REACTION: not effective   Prednisone Diarrhea    Stomach swollen and IBS   Ropinirole Hydrochloride     REACTION: lightheaded  and felt  like going to pass out   Ampicillin Rash    Rash all over body    Medications Reviewed Today     Reviewed by Tammi Sou, CMA (Certified Medical Assistant) on 05/12/22 at [phone]  Med List Status: <None>   Medication Order Taking? Sig Documenting Provider Last Dose Status Informant  acetaminophen (TYLENOL) 500 MG tablet [id number] Yes Take 500 mg by mouth every 6 (six) hours as needed (FOR PAIN.).  [provider] Taking Active Self  albuterol (VENTOLIN HFA) 108 (90 Base) MCG/ACT inhaler [id number] Yes Inhale 2 puffs into the lungs every 4 (four) hours as needed for wheezing. Tower, [name], MD Taking Active   amLODipine (NORVASC) 10 MG tablet [id number] Yes TAKE 1 TABLET BY MOUTH EVERY DAY Tower, [name], MD Taking Active   Bacillus Coagulans-Inulin (PROBIOTIC) 1-250 BILLION-MG CAPS [id number] Yes Take by mouth. [provider] Taking Active   Cholecalciferol 25 MCG (1000 UT) capsule [id number] Yes Take 1,000 Units by mouth. [provider] Taking Active   citalopram (CELEXA) 40 MG tablet [id number] Yes Take 1 tablet (40 mg total) by mouth at bedtime. Tower, [name], MD Taking Active   cyclobenzaprine (FLEXERIL) 10 MG tablet [id number] Yes TAKE 1/2 TO 1 PILL UP TO 3 TIMES DAILY AS NEEDED FOR HEADACHE OR MUSCLE SPASM, CAUTION OF SEDATION Tower, [name], MD Taking Active   dicyclomine (BENTYL) 20 MG tablet [id number] Yes TAKE 1 TABLET BY MOUTH TWICE A DAY BEFORE A MEAL. Tower, [name], MD Taking Active   diphenhydrAMINE-zinc acetate (BENADRYL ITCH STOPPING) cream [id number] Yes Apply 1 application. topically daily as needed for itching. [provider] Taking Active   fexofenadine (ALLEGRA) 180 MG tablet [id number] Yes Take 180 mg by mouth daily as needed. [provider] Taking Active   furosemide (LASIX) 20 MG tablet [id number] Yes TAKE 1 TABLET BY MOUTH EVERY DAY Tower, [name], MD Taking Active   glucose blood (ONETOUCH VERIO) test strip  [id number] Yes Use one daily to check blood sugar (Dx. Code:  R73.03) Tower, [name], MD Taking Active   hydrocortisone cream 1 % [id number] Yes Apply 1 application topically 2 (two) times daily. [provider] Taking Active   Ketotifen Fumarate (ALAWAY OP) [id number] Yes Place 1-2 drops into both eyes 3 (three) times daily as needed (for eye irritation.).  [provider] Taking Active Self  Lancets (ONETOUCH DELICA PLUS [id number]) [place] [id number] Yes CHECK BLOOD SUGAR EVERY DAY Tower, [name], MD Taking Active   loperamide (IMODIUM) 2 MG capsule [id number] Yes Take 2-4 mg by mouth 4 (four) times daily as needed for diarrhea or loose stools. [provider] Taking Active Self  losartan (COZAAR) 100 MG tablet [id number] Yes Take 1 tablet (100 mg total) by mouth daily. Tower, [name], MD Taking Active   meclizine (ANTIVERT) 25 MG tablet [id number] Yes Take 25 mg by mouth 3 (three) times daily as needed (for dizziness/vertigo). [provider] Taking Active Self  omeprazole (PRILOSEC) 20 MG capsule [id number] Yes TAKE 1 CAPSULE BY MOUTH EVERY DAY Tower, [name], MD Taking Active   [facility] LANCETS [place] [id number] Yes 1 each by Other route as directed. CHECK BLOOD GLUCOSE ONCE DAILY AND AS DIRECTED FOR DIABETES MELLITIS Tower, [name], MD Taking Active Self  rosuvastatin (CRESTOR) 10 MG tablet [id number] Yes TAKE 1/2 TABLET BY MOUTH EVERY DAY Tower, [name], MD Taking Active   sodium chloride (MURO 128) 5 % ophthalmic solution [id number] Yes Place 1  drop into both eyes 4 (four) times daily as needed for eye irritation.  [provider] Taking Active Self  Triamcinolone Acetonide (NASACORT ALLERGY 24HR NA) [id number] Yes Place 1 spray into the nose daily as needed (for allergies.). [provider] Taking Active Self           Med Note Tamala Julian, Sharlette Dense   Wed Apr 05, 2019  1:04 PM)    triamcinolone cream (KENALOG) 0.1 % [id number] Yes Apply 1 Application  topically 2 (two) times daily. Tower, [name], MD Taking Active   vitamin B-12 (CYANOCOBALAMIN) 1000 MCG tablet [id number] Yes Take 1,000 mcg by mouth at bedtime. [provider] Taking Active Self            Patient Active Problem List   Diagnosis Date Noted   Anemia in chronic kidney disease (CKD) 02/04/2022   Cough 02/04/2022   Current use of proton pump inhibitor 03/21/2021   Macular degeneration, wet ([place]) 09/16/2020   Anemia due to stage 3a chronic kidney disease ([place]) 07/30/2020   Chronic cough 02/27/2020   Dysphagia 02/27/2020   Migraine with aura 02/22/2018   Goals of care, counseling/discussion 10/14/2017   Malignant neoplasm of upper-outer quadrant of left breast in female, estrogen receptor positive ([place]) 08/02/2017   Intertrigo 02/19/2016   Class 2 obesity due to excess calories with body mass index (BMI) of 35.0 to 35.9 in adult 08/02/2015   Encounter for Medicare annual wellness exam 01/09/2014   Chronic kidney disease, stage 3b ([place]) 01/09/2014   Eczema 07/12/2013   Pedal edema 01/06/2013   Allergic rhinitis 08/21/2009   Iron deficiency anemia 07/09/2009   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   History of TV adenoma of colon 06/07/2009   OBSTRUCTIVE SLEEP APNEA 05/31/2009   ANEMIA, B12 DEFICIENCY 05/24/2009   Depression 05/15/2009   MEMORY LOSS 04/30/2009   Prediabetes 10/26/2007   LIPOMA, BACK 10/01/2006   Hyperlipidemia 10/01/2006   CARPAL TUNNEL SYNDROME 10/01/2006   Essential hypertension 10/01/2006   IBS 10/01/2006   FIBROCYSTIC BREAST DISEASE 10/01/2006   OSTEOARTHRITIS 10/01/2006   SPINAL STENOSIS 10/01/2006   BACK PAIN, CHRONIC 10/01/2006   MIGRAINES, HX OF 10/01/2006    Immunization History  Administered Date(s) Administered   Fluad Quad(high Dose 65+) 02/28/2019, 02/27/2020   Influenza Whole 02/25/2005, 04/30/2009, 03/17/2010   Influenza,inj,Quad PF,6+ Mos 02/10/2013, 01/09/2014, 02/01/2015, 02/03/2016, 02/10/2017,  03/03/2018, 03/21/2021   PFIZER(Purple Top)SARS-COV-2 Vaccination 11/02/2019, 11/23/2019   Pneumococcal Conjugate-13 01/09/2014   Pneumococcal Polysaccharide-23 04/23/2008   Td 04/13/1995, 10/14/2006     Compliance/Adherence/Medication fill history: Care Gaps: None  Star-Rating Drugs: Losartan - PDC 100%  SDOH:  (Social Determinants of Health) assessments and interventions performed: No - pt has AWV this month Jan 2024, will address then SDOH Interventions    [place] from 02/10/2017 in [place] at [place]  SDOH Interventions   Depression Interventions/Treatment  --  Merilyn Baba to [place]: No Food Insecurity (06/03/2021)  Housing: Low Risk  (06/03/2021)  Transportation Needs: No Transportation Needs (06/03/2021)  Alcohol Screen: Low Risk  (06/03/2021)  Depression (PHQ2-9): Low Risk  (06/03/2021)  Financial Resource Strain: Low Risk  (06/03/2021)  Physical Activity: Inactive (06/03/2021)  Social Connections: Moderately Isolated (06/03/2021)  Stress: No Stress Concern Present (06/03/2021)  Tobacco Use: Low Risk  (05/12/2022)    Medication Assistance: None required.  Patient affirms current coverage meets needs.  Medication Access: Within the past 30 days, how  often has patient missed a dose of medication? 0 Is a pillbox or other method used to improve adherence? Yes  Factors that may affect medication adherence? no barriers identified Are meds synced by current pharmacy? No  Are meds delivered by current pharmacy? No  Does patient experience delays in picking up medications due to transportation concerns? No   [facility] Reviewed: Is patient disadvantaged to use [facility]?: Yes  Current Rx insurance plan: BCBS [place] Name and location of Current pharmacy:  [place], [address] [place] [address] Phone: [phone] Fax:  [phone]  [id number] [place] [address]: [phone]: [phone] [facility] services reviewed with patient today?: No  Patient requests to transfer care to [facility]?: No  Reason patient declined to change pharmacies: Disadvantaged due to insurance/mail order and Loyalty to other pharmacy/Patient preference   Assessment/Plan  Hypertension / CKD stage 3b (BP goal <140/90) -Not ideally controlled - Pt recently has changed BP medications per PCP, most recent addition was metoprolol, she started taking it about a week ago; she has not been checking BP daily, she has 1 reading to provide today; she denies side effects or issues with metoprolol -Current home readings:  1/2 157/68   P 53 -Current treatment: Amlodipine 10 mg daily AM - Appropriate, Query Effective Losartan 100 mg daily AM - Appropriate, Query Effective Metoprolol succinate 25 mg daily - Appropriate, Query Effective Furosemide 20 mg daily - Appropriate, Effective, Safe, Accessible -Medications previously tried: atenolol, triamterene/htctz, benazepril (cough) -Educated on BP goals and benefits of medications for prevention of heart attack, stroke and kidney damage; Importance of home blood pressure monitoring; Proper BP monitoring technique; -Reviewed Avastin eye injections started 05/2020, some studies show even ophthalmic injections can effect BP -Reviewed next options for BP control: consider alternate more potent ARB (olmesartan or telmisartan) or hydralazine; caution with diuretic (thiazide or spironolactone) given GFR in 30s  -Counseled to monitor BP daily -Recommend to continue current medication for now  Hyperlipidemia: (LDL goal < 100) -Controlled - LDL 50 (03/2022) at goal, TRIG slightly elevated at 169; pt endorses compliance with statin and denies side effects -Current treatment: Rosuvastatin 10 mg - 1/2 tab daily HS -  Appropriate, Effective, Safe, Accessible -Educated on Cholesterol goals; Benefits of statin for ASCVD risk reduction -Recommended to continue current medication  IBS / GERD (Goal: manage symptoms) -Controlled - pt reports using loperamide before she leaves the house "just in case"; she has seen GI specialist previously and has been told to forego further colonoscopies; she denies issues with GERD -Current treatment  Dicyclomine 20 mg BID -Appropriate, Effective, Safe, Accessible Loperamide 2 mg PRN -Appropriate, Effective, Safe, Accessible Omeprazole 20 mg daily - Appropriate, Effective, Safe, Accessible Probiotic -Appropriate, Effective, Safe, Accessible -Medications previously tried: simethicone -Recommended to continue current medication  Migraine headache (Goal: manage symptoms) -Improving - previously pt reports near daily headaches;  she reports these have improved over last month or two; her migraines have previously affected her motor skills/balance -Current treatment  Cyclobenzaprine 10 mg PRN - Appropriate, Effective, Safe, Accessible Tylenol 500 mg PRN - Appropriate, Effective, Safe, Accessible -Medications previously tried: tramadol, gabapentin, atenolol -Previously Educated on migraine prevention options - beta blockers, amitriptyline, topiramate are first line option; pt has not tolerated atenolol due to low pulse; would avoid amitriptyline due to anticholinergic properties and age; would avoid topiramate due to potential to worsen  cognitive issues; CGRP antagonists would be a good option -Can consider CGRP-antagonist (Emgality) for migraine prevention if desired  Allergic rhinitis (Goal: manage symptoms) -Controlled - pt is off of Benadryl after discussion at last visit; she is now using Allegra and Benadryl only rarely for severe symptoms -Current treatment  Nasacort nasal spray PRN Albuterol HFA prn Ketotifen ([place]) eye drops Systane eye drops Coricidin OTC Allegra  daily Benadryl cream PRN -Medications previously tried: Benadryl oral -Recommend to continue current medication   Charlene Brooke, PharmD, BCACP Clinical Pharmacist [facility] [phone]

## 2022-05-21 NOTE — Progress Notes (Signed)
Entered in Error

## 2022-05-25 DIAGNOSIS — H35372 Puckering of macula, left eye: Secondary | ICD-10-CM | POA: Diagnosis not present

## 2022-05-25 DIAGNOSIS — H353211 Exudative age-related macular degeneration, right eye, with active choroidal neovascularization: Secondary | ICD-10-CM | POA: Diagnosis not present

## 2022-05-25 DIAGNOSIS — H35033 Hypertensive retinopathy, bilateral: Secondary | ICD-10-CM | POA: Diagnosis not present

## 2022-05-25 DIAGNOSIS — H34832 Tributary (branch) retinal vein occlusion, left eye, with macular edema: Secondary | ICD-10-CM | POA: Diagnosis not present

## 2022-05-25 DIAGNOSIS — H43813 Vitreous degeneration, bilateral: Secondary | ICD-10-CM | POA: Diagnosis not present

## 2022-05-25 DIAGNOSIS — H35412 Lattice degeneration of retina, left eye: Secondary | ICD-10-CM | POA: Diagnosis not present

## 2022-05-28 ENCOUNTER — Encounter: Payer: Self-pay | Admitting: Oncology

## 2022-05-28 ENCOUNTER — Other Ambulatory Visit: Payer: Self-pay | Admitting: Family Medicine

## 2022-05-29 NOTE — Telephone Encounter (Signed)
F/u scheduled 06/12/22, last filled on 03/21/21 #90 tabs/ 3 refills

## 2022-06-04 ENCOUNTER — Ambulatory Visit (INDEPENDENT_AMBULATORY_CARE_PROVIDER_SITE_OTHER): Payer: Medicare Other

## 2022-06-04 VITALS — Ht 63.5 in | Wt 202.0 lb

## 2022-06-04 DIAGNOSIS — Z0000 Encounter for general adult medical examination without abnormal findings: Secondary | ICD-10-CM | POA: Diagnosis not present
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
## 2022-06-04 NOTE — Patient Instructions (Addendum)
[name] , Thank you for taking time to come for your Medicare Wellness Visit. I appreciate your ongoing commitment to your health goals. Please review the following plan we discussed and let me know if I can assist you in the future.   These are the goals we discussed:  Goals Addressed             This Visit's Progress    Maintain healthy lifestyle       Stay active Healthy diet         This is a list of the screening recommended for you and due dates:  Health Maintenance  Topic Date Due   DTaP/Tdap/Td vaccine (3 - Tdap) 10/13/2016   COVID-19 Vaccine (3 - Pfizer risk series) 06/20/2022*   Zoster (Shingles) Vaccine (1 of 2) 07/15/2022*   Flu Shot  08/16/2022*   Yearly kidney health urinalysis for diabetes  04/15/2027*   Mammogram  07/29/2022   Hemoglobin A1C  10/06/2022   Yearly kidney function blood test for diabetes  04/08/2023   Medicare Annual Wellness Visit  06/05/2023   Pneumonia Vaccine  Completed   DEXA scan (bone density measurement)  Completed   HPV Vaccine  Aged Out   Complete foot exam   Discontinued   Eye exam for diabetics  Discontinued   Colon Cancer Screening  Discontinued  *Topic was postponed. The date shown is not the original due date.   Advanced directives: End of life planning; Advance aging; Advanced directives discussed.  Copy of current HCPOA/Living Will requested.    Next appointment: Follow up in one year for your annual wellness visit    Preventive Care 65 Years and Older, Female Preventive care refers to lifestyle choices and visits with your health care provider that can promote health and wellness. What does preventive care include? A yearly physical exam. This is also called an annual well check. Dental exams once or twice a year. Routine eye exams. Ask your health care provider how often you should have your eyes checked. Personal lifestyle choices, including: Daily care of your teeth and gums. Regular physical activity. Eating a  healthy diet. Avoiding tobacco and drug use. Limiting alcohol use. Practicing safe sex. Taking low-dose aspirin every day. Taking vitamin and mineral supplements as recommended by your health care provider. What happens during an annual well check? The services and screenings done by your health care provider during your annual well check will depend on your age, overall health, lifestyle risk factors, and family history of disease. Counseling  Your health care provider may ask you questions about your: Alcohol use. Tobacco use. Drug use. Emotional well-being. Home and relationship well-being. Sexual activity. Eating habits. History of falls. Memory and ability to understand (cognition). Work and work Statistician. Reproductive health. Screening  You may have the following tests or measurements: Height, weight, and BMI. Blood pressure. Lipid and cholesterol levels. These may be checked every 5 years, or more frequently if you are over 28 years old. Skin check. Lung cancer screening. You may have this screening every year starting at age 26 if you have a 30-pack-year history of smoking and currently smoke or have quit within the past 15 years. Fecal occult blood test (FOBT) of the stool. You may have this test every year starting at age 61. Flexible sigmoidoscopy or colonoscopy. You may have a sigmoidoscopy every 5 years or a colonoscopy every 10 years starting at age 43. Hepatitis C blood test. Hepatitis B blood test. Sexually transmitted disease (STD)  testing. Diabetes screening. This is done by checking your blood sugar (glucose) after you have not eaten for a while (fasting). You may have this done every 1-3 years. Bone density scan. This is done to screen for osteoporosis. You may have this done starting at age 106. Mammogram. This may be done every 1-2 years. Talk to your health care provider about how often you should have regular mammograms. Talk with your health care  provider about your test results, treatment options, and if necessary, the need for more tests. Vaccines  Your health care provider may recommend certain vaccines, such as: Influenza vaccine. This is recommended every year. Tetanus, diphtheria, and acellular pertussis (Tdap, Td) vaccine. You may need a Td booster every 10 years. Zoster vaccine. You may need this after age 20. Pneumococcal 13-valent conjugate (PCV13) vaccine. One dose is recommended after age 89. Pneumococcal polysaccharide (PPSV23) vaccine. One dose is recommended after age 44. Talk to your health care provider about which screenings and vaccines you need and how often you need them. This information is not intended to replace advice given to you by your health care provider. Make sure you discuss any questions you have with your health care provider. Document Released: 05/31/2015 Document Revised: 01/22/2016 Document Reviewed: 03/05/2015 Elsevier Interactive Patient Education  2017 [employer] Prevention in the Home Falls can cause injuries. They can happen to people of all ages. There are many things you can do to make your home safe and to help prevent falls. What can I do on the outside of my home? Regularly fix the edges of walkways and driveways and fix any cracks. Remove anything that might make you trip as you walk through a door, such as a raised step or threshold. Trim any bushes or trees on the path to your home. Use bright outdoor lighting. Clear any walking paths of anything that might make someone trip, such as rocks or tools. Regularly check to see if handrails are loose or broken. Make sure that both sides of any steps have handrails. Any raised decks and porches should have guardrails on the edges. Have any leaves, snow, or ice cleared regularly. Use sand or salt on walking paths during winter. Clean up any spills in your garage right away. This includes oil or grease spills. What can I do in the  bathroom? Use night lights. Install grab bars by the toilet and in the tub and shower. Do not use towel bars as grab bars. Use non-skid mats or decals in the tub or shower. If you need to sit down in the shower, use a plastic, non-slip stool. Keep the floor dry. Clean up any water that spills on the floor as soon as it happens. Remove soap buildup in the tub or shower regularly. Attach bath mats securely with double-sided non-slip rug tape. Do not have throw rugs and other things on the floor that can make you trip. What can I do in the bedroom? Use night lights. Make sure that you have a light by your bed that is easy to reach. Do not use any sheets or blankets that are too big for your bed. They should not hang down onto the floor. Have a firm chair that has side arms. You can use this for support while you get dressed. Do not have throw rugs and other things on the floor that can make you trip. What can I do in the kitchen? Clean up any spills right away. Avoid walking on wet  floors. Keep items that you use a lot in easy-to-reach places. If you need to reach something above you, use a strong step stool that has a grab bar. Keep electrical cords out of the way. Do not use floor polish or wax that makes floors slippery. If you must use wax, use non-skid floor wax. Do not have throw rugs and other things on the floor that can make you trip. What can I do with my stairs? Do not leave any items on the stairs. Make sure that there are handrails on both sides of the stairs and use them. Fix handrails that are broken or loose. Make sure that handrails are as long as the stairways. Check any carpeting to make sure that it is firmly attached to the stairs. Fix any carpet that is loose or worn. Avoid having throw rugs at the top or bottom of the stairs. If you do have throw rugs, attach them to the floor with carpet tape. Make sure that you have a light switch at the top of the stairs and the  bottom of the stairs. If you do not have them, ask someone to add them for you. What else can I do to help prevent falls? Wear shoes that: Do not have high heels. Have rubber bottoms. Are comfortable and fit you well. Are closed at the toe. Do not wear sandals. If you use a stepladder: Make sure that it is fully opened. Do not climb a closed stepladder. Make sure that both sides of the stepladder are locked into place. Ask someone to hold it for you, if possible. Clearly mark and make sure that you can see: Any grab bars or handrails. First and last steps. Where the edge of each step is. Use tools that help you move around (mobility aids) if they are needed. These include: Canes. Walkers. Scooters. Crutches. Turn on the lights when you go into a dark area. Replace any light bulbs as soon as they burn out. Set up your furniture so you have a clear path. Avoid moving your furniture around. If any of your floors are uneven, fix them. If there are any pets around you, be aware of where they are. Review your medicines with your doctor. Some medicines can make you feel dizzy. This can increase your chance of falling. Ask your doctor what other things that you can do to help prevent falls. This information is not intended to replace advice given to you by your health care provider. Make sure you discuss any questions you have with your health care provider. Document Released: 02/28/2009 Document Revised: 10/10/2015 Document Reviewed: 06/08/2014 Elsevier Interactive Patient Education  2017 [employer].

## 2022-06-12 ENCOUNTER — Encounter: Payer: Self-pay | Admitting: Oncology

## 2022-06-12 ENCOUNTER — Encounter: Payer: Self-pay | Admitting: Family Medicine

## 2022-06-12 ENCOUNTER — Ambulatory Visit (INDEPENDENT_AMBULATORY_CARE_PROVIDER_SITE_OTHER): Payer: Medicare Other | Admitting: Family Medicine

## 2022-06-12 VITALS — BP 144/61 | HR 69 | Temp 97.9°F | Ht 63.5 in | Wt 201.2 lb

## 2022-06-12 DIAGNOSIS — Z6835 Body mass index (BMI) 35.0-35.9, adult: Secondary | ICD-10-CM

## 2022-06-12 DIAGNOSIS — E7800 Pure hypercholesterolemia, unspecified: Secondary | ICD-10-CM

## 2022-06-12 DIAGNOSIS — R600 Localized edema: Secondary | ICD-10-CM

## 2022-06-12 DIAGNOSIS — N1832 Chronic kidney disease, stage 3b: Secondary | ICD-10-CM

## 2022-06-12 DIAGNOSIS — I10 Essential (primary) hypertension: Secondary | ICD-10-CM | POA: Diagnosis not present

## 2022-06-12 DIAGNOSIS — R7303 Prediabetes: Secondary | ICD-10-CM | POA: Diagnosis not present

## 2022-06-12 MED ORDER — OLMESARTAN MEDOXOMIL 40 MG PO TABS: 40.0000 mg | ORAL_TABLET | Freq: Every day | ORAL | 1 refills | Status: DC
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
## 2022-06-12 NOTE — Patient Instructions (Addendum)
Stop the metoprolol  Stop the losartan   We will swap the losartan for olmesartan (benicar) 40 mg daily   Keep watching your blood pressure  If any intolerable side effects let me know   Keep up your fluids Watch processed food and sodium   Keep working on exercise that is safe for you   Follow up 3-4 weeks for blood pressure

## 2022-06-14 NOTE — Assessment & Plan Note (Signed)
Did not tolerate metoprolol   BP: (!) 144/61  Will change losartan to olmesartan 40 mg  Inst to call if any side effects   Continue amlodipine 10 mg daily  Lasix 20 mg daily  Last labs rev  Will re check renal panel at f/u

## 2022-06-14 NOTE — Assessment & Plan Note (Signed)
Last GFR 36.6  We are changing losartan to olmesartan for bp today Plan to re check renal panel at f/u

## 2022-06-14 NOTE — Assessment & Plan Note (Signed)
Worse with both amlodipine and metoprolol   Will d/c metoprolol F/u planned

## 2022-06-14 NOTE — Assessment & Plan Note (Signed)
Lab Results  Component Value Date   HGBA1C 6.2 04/07/2022   Enc low glycemic diet and wt loss as tolerated

## 2022-06-23 ENCOUNTER — Other Ambulatory Visit: Payer: Self-pay | Admitting: Family Medicine

## 2022-06-25 ENCOUNTER — Telehealth: Payer: Self-pay

## 2022-06-25 NOTE — Progress Notes (Signed)
Care Management & Coordination Services Pharmacy Team  Reason for Encounter: Appointment Reminder  Contacted patient to confirm telephone appointment with Charlene Brooke, PharmD on 06/30/2022 at 11:00. Patient rescheduled her appointment to July 23, 2022 at 3:00 pm.  Spoke with patient on 06/25/2022   Do you have any problems getting your medications? No  What is your top health concern you would like to discuss at your upcoming visit? No concerns  Have you seen any other providers since your last visit with PCP? No  Star Rating Drugs:  Medication:  Last Fill: Day Supply Olmesartan 40 mg 06/12/22 90 Rosuvastatin 10 mg 04/20/22 90 Losartan 100 mg 04/14/22 90  Care Gaps: Annual wellness visit in last year? Yes 06/04/2022  Charlene Brooke, PharmD notified  Marijean Niemann, [place] Pharmacy Assistant [phone]

## 2022-06-30 ENCOUNTER — Encounter: Payer: Self-pay | Admitting: Family Medicine

## 2022-06-30 ENCOUNTER — Encounter: Payer: Medicare Other | Admitting: Pharmacist

## 2022-06-30 ENCOUNTER — Ambulatory Visit (INDEPENDENT_AMBULATORY_CARE_PROVIDER_SITE_OTHER): Payer: Medicare Other | Admitting: Family Medicine

## 2022-06-30 VITALS — BP 145/60 | HR 83 | Temp 98.2°F | Ht 63.5 in | Wt 202.0 lb

## 2022-06-30 DIAGNOSIS — N1832 Chronic kidney disease, stage 3b: Secondary | ICD-10-CM

## 2022-06-30 DIAGNOSIS — I10 Essential (primary) hypertension: Secondary | ICD-10-CM

## 2022-06-30 DIAGNOSIS — D631 Anemia in chronic kidney disease: Secondary | ICD-10-CM | POA: Diagnosis not present

## 2022-06-30 DIAGNOSIS — G43109 Migraine with aura, not intractable, without status migrainosus: Secondary | ICD-10-CM | POA: Diagnosis not present

## 2022-06-30 DIAGNOSIS — F430 Acute stress reaction: Secondary | ICD-10-CM

## 2022-06-30 LAB — BASIC METABOLIC PANEL
BUN: 26 mg/dL — ABNORMAL HIGH (ref 6–23)
CO2: 26 mEq/L (ref 19–32)
Calcium: 8.8 mg/dL (ref 8.4–10.5)
Chloride: 100 mEq/L (ref 96–112)
Creatinine, Ser: 1.53 mg/dL — ABNORMAL HIGH (ref 0.40–1.20)
GFR: 32.04 mL/min — ABNORMAL LOW (ref >60.00)
Glucose, Bld: 108 mg/dL — ABNORMAL HIGH (ref 70–99)
Potassium: 4.6 mEq/L (ref 3.5–5.1)
Sodium: 134 mEq/L — ABNORMAL LOW (ref 135–145)

## 2022-06-30 NOTE — Assessment & Plan Note (Signed)
Pt exp some anxious mood from stress reaction  Mostly family and health related Reviewed stressors/ coping techniques/symptoms/ support sources/ tx options and side effects in detail today  Pt declines counseling - asked her to re consider and let us know Mood may affect bp somewhat   Takes celexa 40 mg daily

## 2022-06-30 NOTE — Assessment & Plan Note (Signed)
BP: (!) 145/60  Slight improvement but still struggling to get systolic down She likes olmesartan much more than losartan  Will continue 40 mg  Also amlodipine 10 mg daily and lasix 20 mg daily   Intol of metoprolol  Want to avoid other diuretics due to dec GFR Has appt with pharmacist next month and would appreciate suggestions to lower systolic but not diastolic Bmp today
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
## 2022-06-30 NOTE — Patient Instructions (Addendum)
If you want to consider some mental health counseling let us know   Check your blood pressure when you are as relaxed as possible   Be more active  Watch sodium in diet (avoid processed foods) Try to get most of your carbohydrates from produce (with the exception of white potatoes)  Eat less bread/pasta/rice/snack foods/cereals/sweets and other items from the middle of the grocery store (processed carbs)  Follow up with Ria Comment the pharmacist as planned   I will give her the heads up that we need some help with with blood pressure   Lab today for kidney numbers

## 2022-06-30 NOTE — Assessment & Plan Note (Signed)
Ongoing Hope for improvement once bp is controlled

## 2022-06-30 NOTE — Assessment & Plan Note (Signed)
Last GFR 36.6 Drinking more water since then  Bp is not at goal- recently did change losartan to olmesartan   Bmp ordered

## 2022-06-30 NOTE — Assessment & Plan Note (Signed)
Lab today.

## 2022-07-05 ENCOUNTER — Telehealth: Payer: Self-pay | Admitting: Family Medicine

## 2022-07-05 NOTE — Telephone Encounter (Signed)
Please let pt know that I want to try a medicine called isosorbide mononitrate for her bp  Hoping this would lower systolic without lowering the diastolic (lower number) Is she ok with that? What pharmacy?   Received from pharmacy:   [facility], [facility]  Kenadi Miltner, [name], MD There are not a lot of good options left - thiazides and CCB are the preferred options for isolated systolic HTN, we're avoiding diuretics and she's already on amlodipine. Hydralazine actually lowers DBP more than SBP so we should probably avoid that.  Isosorbide mononitrate has a few studies that show improvement in SBP without change in DBP, this is probably our best option right now. I would start with 15 mg per day.

## 2022-07-06 MED ORDER — ISOSORBIDE MONONITRATE ER 30 MG PO TB24: 15.0000 mg | ORAL_TABLET | Freq: Every day | ORAL | 1 refills | Status: DC

## 2022-07-06 NOTE — Telephone Encounter (Signed)
Pt notified of Dr. Marliss Coots comments. She is okay trying isosorbide, please send to [facility]

## 2022-07-06 NOTE — Telephone Encounter (Signed)
I sent it  Please f/u in 4-6 wk Alert [place] if any side effects

## 2022-07-07 NOTE — Telephone Encounter (Signed)
F/u appt scheduled and pt advised of Dr. Marliss Coots comments

## 2022-07-08 DIAGNOSIS — H353211 Exudative age-related macular degeneration, right eye, with active choroidal neovascularization: Secondary | ICD-10-CM | POA: Diagnosis not present

## 2022-07-20 ENCOUNTER — Telehealth: Payer: Self-pay

## 2022-07-20 NOTE — Progress Notes (Signed)
Care Management & Coordination Services Pharmacy Team  Reason for Encounter: Appointment Reminder  Contacted patient to confirm telephone appointment with Ann Cummings, PharmD on 07/23/2022 at 3:00.  Spoke with patient on 07/20/2022   Do you have any problems getting your medications? No  What is your top health concern you would like to discuss at your upcoming visit? Headaches and off balance has been happening for 1-2 years. Patient states she is now having diarrhea. Believes it is her medication making her have diarrhea and would like to go over side effects at her appointment.   Have you seen any other providers since your last visit with PCP? No  Star Rating Drugs:  Medication:                Last Fill:         Day Supply Olmesartan 40 mg      06/12/22          90 Rosuvastatin 10 mg    04/20/22          90 Losartan 100 mg         04/14/22          90   Care Gaps: Annual wellness visit in last year? Yes 06/04/2022   Ann Cummings, PharmD notified   Ann Cummings, [place] Pharmacy Assistant [phone]

## 2022-07-23 ENCOUNTER — Ambulatory Visit: Payer: Medicare Other | Admitting: Pharmacist

## 2022-07-23 NOTE — Progress Notes (Signed)
Care Management & Coordination Services Pharmacy Note  07/23/2022 Name:  Ann Cummings MRN:  [id number] DOB:  11/21/1940  Summary: F/U visit -HTN: BP elevated at home despite addition of isosorbide 15 mg 3 weeks ago. She endorses compliance with amlodipine, olmesartan, furosemide as well; per home reading SBP remains elevated, DBP is generally normal 3/7 127/86  3/6 168/75 3/5 153/72 3/4 163/77 3/3 154/66 3/2 172/80  Recommendations/Changes made from today's visit: -Increase isosorbide to 30 mg daily (whole tablet); f/u with PCP as scheduled -Though previously avoiding diuretic d/t renal function (GFR 32), may consider thiazide diuretic in future if BP cannot be controlled - studies show thiazides can maintain antihypertensive effect even with low GFR, while they lose diuretic effect  Follow up plan: -Pharmacist follow up televisit scheduled for 6 weeks -Oncology appt 08/11/22; PCP appt 08/18/22    Subjective: Ann Cummings is an 82 y.o. year old female who is a primary patient of Tower, [name], MD.  The care coordination team was consulted for assistance with disease management and care coordination needs.    Engaged with patient by telephone for follow up visit.  Patient Care Team: Tower, [name], MD as PCP - General Ninfa Linden Lind Guest, MD as Consulting Physician (Orthopedic Surgery) Earnie Larsson, MD as Consulting Physician (Neurosurgery) Oneta Rack, MD as Consulting Physician (Dermatology) Earlie Server, MD as Consulting Physician (Hematology and Oncology) Charlton Haws, [facility] as Pharmacist (Pharmacist)  Recent office visits: 06/30/22 Dr Glori Bickers OV: BP - BP 145/60. Start isosorbide 15 mg (consult with PharmD)  06/12/21 Dr Glori Bickers OV: BP 144/61- D/C losartan, metoprolol. Start Olmesartan 40 mg.  05/12/22 Dr Glori Bickers OV: f/u - BP 152/65. Rx metoprolol XL 25 mg. 04/14/22 Dr Glori Bickers OV: annual - BP 146/58. Change benazepril to losartan 100 mg d/t cough. F/u 2-4 wk.    Recent consult visits: 02/04/22 Dr Tasia Catchings (Heme/onc): f/u breast ca - stable. Acute cough - xray wnl.  08/01/21 NP Santa Lighter Zenia Resides (Heme/onc): f/u breast cancer, IDA. Declined aromatase inhibitor. Mammogram no evidence of recurrence. HGB at baseline.  Hospital visits: None in previous 6 months   Objective:  Lab Results  Component Value Date   CREATININE 1.53 (H) 06/30/2022   BUN 26 (H) 06/30/2022   GFR 32.04 (L) 06/30/2022   GFRNONAA 40 (L) 02/02/2022   GFRAA 46 (L) 01/17/2020   NA 134 (L) 06/30/2022   K 4.6 06/30/2022   CALCIUM 8.8 06/30/2022   CO2 26 06/30/2022   GLUCOSE 108 (H) 06/30/2022    Lab Results  Component Value Date/Time   HGBA1C 6.2 04/07/2022 08:18 AM   HGBA1C 6.0 03/21/2021 10:54 AM   GFR 32.04 (L) 06/30/2022 10:31 AM   GFR 36.64 (L) 04/07/2022 08:18 AM   MICROALBUR 1.6 04/24/2009 08:42 AM   MICROALBUR 9.6 (H) 04/17/2008 10:08 AM    Last diabetic Eye exam:  Lab Results  Component Value Date/Time   HMDIABEYEEXA Retinopathy (A) 05/07/2020 12:00 AM    Last diabetic Foot exam:  Lab Results  Component Value Date/Time   HMDIABFOOTEX yes 08/21/2009 12:00 AM     Lab Results  Component Value Date   CHOL 131 04/07/2022   HDL 47.10 04/07/2022   LDLCALC 50 04/07/2022   LDLDIRECT 61.0 03/21/2021   TRIG 169.0 (H) 04/07/2022   CHOLHDL 3 04/07/2022       Latest Ref Rng & Units 04/07/2022    8:18 AM 02/02/2022   10:50 AM 07/28/2021    1:35 PM  Hepatic Function  Total Protein 6.0 - 8.3 g/dL 6.3  6.6  6.7   Albumin 3.5 - 5.2 g/dL 4.0  3.6  3.7   AST 0 - 37 U/L '13  14  13   '$ ALT 0 - 35 U/L '9  11  11   '$ Alk Phosphatase 39 - 117 U/L 48  42  45   Total Bilirubin 0.2 - 1.2 mg/dL 0.3  0.3  0.6     Lab Results  Component Value Date/Time   TSH 1.99 04/07/2022 08:18 AM   TSH 1.32 03/21/2021 10:54 AM       Latest Ref Rng & Units 04/07/2022    8:18 AM 02/02/2022   10:50 AM 07/28/2021    1:35 PM  CBC  WBC 4.0 - 10.5 K/uL 9.7  8.9  11.6   Hemoglobin 12.0 - 15.0  g/dL 11.4  10.8  11.0   Hematocrit 36.0 - 46.0 % 34.5  33.2  34.2   Platelets 150.0 - 400.0 K/uL 351.0  311  322     Lab Results  Component Value Date/Time   VD25OH 21 (L) 05/16/2009 10:15 PM   VITAMINB12 1,234 (H) 04/07/2022 08:18 AM   VITAMINB12 819 03/21/2021 10:54 AM    Clinical ASCVD: No  The ASCVD Risk score (Arnett DK, et al., 2019) failed to calculate for the following reasons:   The 2019 ASCVD risk score is only valid for ages 33 to 17       06/30/2022   10:38 AM 06/04/2022   12:46 PM 06/03/2021   12:06 PM  Depression screen PHQ 2/9  Decreased Interest 1 0 0  Down, Depressed, Hopeless 1 0 1  PHQ - 2 Score 2 0 1  Altered sleeping 0    Tired, decreased energy 1    Change in appetite 0    Feeling bad or failure about yourself  1    Trouble concentrating 0    Moving slowly or fidgety/restless 0    Suicidal thoughts 1    PHQ-9 Score 5    Difficult doing work/chores Not difficult at all       Social History   Tobacco Use  Smoking Status Never  Smokeless Tobacco Never   BP Readings from Last 3 Encounters:  06/30/22 (!) 145/60  06/12/22 (!) 144/61  05/12/22 (!) 152/65   Pulse Readings from Last 3 Encounters:  06/30/22 83  06/12/22 69  05/12/22 75   Wt Readings from Last 3 Encounters:  06/30/22 202 lb (91.6 kg)  06/12/22 201 lb 4 oz (91.3 kg)  06/04/22 202 lb (91.6 kg)   BMI Readings from Last 3 Encounters:  06/30/22 35.22 kg/m  06/12/22 35.09 kg/m  06/04/22 35.22 kg/m    Allergies  Allergen Reactions   Calcium     REACTION: severe constipation   Cetirizine Hcl     REACTION: reaction not known   Codeine     Per pt elevated blood pressure and caused haedache   Gabapentin     REACTION: Confussion   Metoprolol     Ankle swelling Dizzy headaches   Mometasone Furoate     REACTION: not effective   Prednisone Diarrhea    Stomach swollen and IBS   Ropinirole Hydrochloride     REACTION: lightheaded and felt like going to pass out   Ampicillin  Rash    Rash all over body    Medications Reviewed Today     Reviewed by Charlton Haws, [facility] (Pharmacist) on 07/23/22 at 1736  Med List Status: <None>   Medication Order Taking? Sig Documenting Provider Last Dose Status Informant  acetaminophen (TYLENOL) 500 MG tablet [id number] Yes Take 500 mg by mouth every 6 (six) hours as needed (FOR PAIN.).  [provider] Taking Active Self  albuterol (VENTOLIN HFA) 108 (90 Base) MCG/ACT inhaler [id number] Yes Inhale 2 puffs into the lungs every 4 (four) hours as needed for wheezing. Tower, [name], MD Taking Active   amLODipine (NORVASC) 10 MG tablet [id number] Yes TAKE 1 TABLET BY MOUTH EVERY DAY Tower, [name], MD Taking Active   Bacillus Coagulans-Inulin (PROBIOTIC) 1-250 BILLION-MG CAPS [id number] Yes Take by mouth. [provider] Taking Active   Cholecalciferol 25 MCG (1000 UT) capsule [id number] Yes Take 1,000 Units by mouth. [provider] Taking Active   citalopram (CELEXA) 40 MG tablet [id number] Yes TAKE 1 TABLET BY MOUTH EVERY DAY AT BEDTIME Tower, [name], MD Taking Active   cyclobenzaprine (FLEXERIL) 10 MG tablet [id number] Yes TAKE 1/2 TO 1 PILL UP TO 3 TIMES DAILY AS NEEDED FOR HEADACHE OR MUSCLE SPASM, CAUTION OF SEDATION Tower, [name], MD Taking Active   dicyclomine (BENTYL) 20 MG tablet [id number] Yes TAKE 1 TABLET BY MOUTH TWICE A DAY BEFORE A MEAL. Tower, [name], MD Taking Active   diphenhydrAMINE-zinc acetate (BENADRYL ITCH STOPPING) cream [id number] Yes Apply 1 application. topically daily as needed for itching. [provider] Taking Active   fexofenadine (ALLEGRA) 180 MG tablet [id number] Yes Take 180 mg by mouth daily as needed. [provider] Taking Active   furosemide (LASIX) 20 MG tablet [id number] Yes TAKE 1 TABLET BY MOUTH EVERY DAY Tower, [name], MD Taking Active   glucose blood (ONETOUCH VERIO) test strip [id number] Yes Use one daily to check blood sugar (Dx. Code:  R73.03)  Tower, [name], MD Taking Active   hydrocortisone cream 1 % [id number] Yes Apply 1 application topically 2 (two) times daily. [provider] Taking Active   isosorbide mononitrate (IMDUR) 30 MG 24 hr tablet [id number] Yes Take 0.5 tablets (15 mg total) by mouth daily. Tower, [name], MD Taking Active   Ketotifen Fumarate (ALAWAY OP) [id number] Yes Place 1-2 drops into both eyes 3 (three) times daily as needed (for eye irritation.).  [provider] Taking Active Self  Lancets (ONETOUCH DELICA PLUS 123XX123) [place] [id number] Yes CHECK BLOOD SUGAR EVERY DAY Tower, [name], MD Taking Active   loperamide (IMODIUM) 2 MG capsule [id number] Yes Take 2-4 mg by mouth 4 (four) times daily as needed for diarrhea or loose stools. [provider] Taking Active Self  meclizine (ANTIVERT) 25 MG tablet [id number] Yes Take 25 mg by mouth 3 (three) times daily as needed (for dizziness/vertigo). [provider] Taking Active Self  olmesartan (BENICAR) 40 MG tablet [id number] Yes Take 1 tablet (40 mg total) by mouth daily. Tower, [name], MD Taking Active   omeprazole (PRILOSEC) 20 MG capsule [id number] Yes TAKE 1 CAPSULE BY MOUTH EVERY DAY Tower, [name], MD Taking Active   [facility] LANCETS FINE [place] [id number] Yes 1 each by Other route as directed. CHECK BLOOD GLUCOSE ONCE DAILY AND AS DIRECTED FOR DIABETES MELLITIS Tower, [name], MD Taking Active Self  rosuvastatin (CRESTOR) 10 MG tablet [id number] Yes TAKE 1/2 TABLET BY MOUTH EVERY DAY Tower, [name], MD Taking Active   sodium chloride (MURO 128) 5 % ophthalmic solution [id number] Yes Place 1 drop into both eyes 4 (four) times daily as needed for eye irritation.  [provider] Taking Active Self  Triamcinolone Acetonide (NASACORT ALLERGY 24HR NA) [id number] Yes Place 1 spray into the nose daily as needed (for allergies.). [provider] Taking Active Self           Med Note Tamala Julian, Sharlette Dense   Wed Apr 05, 2019  1:04  PM)    triamcinolone cream (KENALOG) 0.1 % [id number] Yes Apply 1 Application topically 2 (two) times daily. Tower, [name], MD Taking Active   vitamin B-12 (CYANOCOBALAMIN) 1000 MCG tablet [id number] Yes Take 1,000 mcg by mouth at bedtime. [provider] Taking Active Self            Patient Active Problem List   Diagnosis Date Noted   Stress reaction 06/30/2022   Anemia in chronic kidney disease (CKD) 02/04/2022   Cough 02/04/2022   Current use of proton pump inhibitor 03/21/2021   Macular degeneration, wet ([place]) 09/16/2020   Anemia due to stage 3a chronic kidney disease ([place]) 07/30/2020   Chronic cough 02/27/2020   Dysphagia 02/27/2020   Migraine with aura 02/22/2018   Goals of care, counseling/discussion 10/14/2017   Malignant neoplasm of upper-outer quadrant of left breast in female, estrogen receptor positive ([place]) 08/02/2017   Intertrigo 02/19/2016   Class 2 obesity due to excess calories with body mass index (BMI) of 35.0 to 35.9 in adult 08/02/2015   Encounter for Medicare annual wellness exam 01/09/2014   Chronic kidney disease, stage 3b ([place]) 01/09/2014   Eczema 07/12/2013   Pedal edema 01/06/2013   Allergic rhinitis 08/21/2009   Iron deficiency anemia 07/09/2009   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   History of TV adenoma of colon 06/07/2009   OBSTRUCTIVE SLEEP APNEA 05/31/2009   ANEMIA, B12 DEFICIENCY 05/24/2009   Depression 05/15/2009   MEMORY LOSS 04/30/2009   Prediabetes 10/26/2007   LIPOMA, BACK 10/01/2006   Hyperlipidemia 10/01/2006   CARPAL TUNNEL SYNDROME 10/01/2006   Essential hypertension 10/01/2006   IBS 10/01/2006   FIBROCYSTIC BREAST DISEASE 10/01/2006   OSTEOARTHRITIS 10/01/2006   SPINAL STENOSIS 10/01/2006   BACK PAIN, CHRONIC 10/01/2006   MIGRAINES, HX OF 10/01/2006    Immunization History  Administered Date(s) Administered   Fluad Quad(high Dose 65+) 02/28/2019, 02/27/2020   Influenza Whole 02/25/2005, 04/30/2009,  03/17/2010   Influenza,inj,Quad PF,6+ Mos 02/10/2013, 01/09/2014, 02/01/2015, 02/03/2016, 02/10/2017, 03/03/2018, 03/21/2021   PFIZER(Purple Top)SARS-COV-2 Vaccination 11/02/2019, 11/23/2019   Pneumococcal Conjugate-13 01/09/2014   Pneumococcal Polysaccharide-23 04/23/2008   Td 04/13/1995, 10/14/2006     Compliance/Adherence/Medication fill history: Care Gaps: None  Star-Rating Drugs: Losartan - PDC 100%  SDOH:  (Social Determinants of Health) assessments and interventions performed: No - pt has AWV this month Jan 2024, will address then SDOH Interventions    Comfort Office Visit from 06/30/2022 in [place] at [place] from 06/04/2022 in [place] at [place] from 02/10/2017 in [place] at [place] Interventions -- Intervention Not Indicated --  Housing Interventions -- Intervention Not Indicated --  Transportation Interventions -- Intervention Not Indicated --  Utilities Interventions -- Intervention Not Indicated --  Depression Interventions/Treatment  Medication -- --  Merilyn Baba to PCP]  Financial Strain Interventions -- Intervention Not Indicated --  Stress Interventions -- Intervention Not Indicated --  Social Connections Interventions -- Intervention Not Indicated --      SDOH Screenings   Food Insecurity: No Food Insecurity (06/04/2022)  Housing: Low  Risk  (06/04/2022)  Transportation Needs: No Transportation Needs (06/04/2022)  Utilities: Not At Risk (06/04/2022)  Alcohol Screen: Low Risk  (06/03/2021)  Depression (PHQ2-9): Medium Risk (06/30/2022)  Financial Resource Strain: Low Risk  (06/04/2022)  Physical Activity: Inactive (06/03/2021)  Social Connections: Moderately Isolated (06/04/2022)  Stress: No Stress Concern Present (06/04/2022)  Tobacco Use: Low Risk  (06/30/2022)    Medication Assistance: None required.   Patient affirms current coverage meets needs.  Medication Access: Within the past 30 days, how often has patient missed a dose of medication? 0 Is a pillbox or other method used to improve adherence? Yes  Factors that may affect medication adherence? no barriers identified Are meds synced by current pharmacy? No  Are meds delivered by current pharmacy? No  Does patient experience delays in picking up medications due to transportation concerns? No   [facility] Services Reviewed: Is patient disadvantaged to use [facility]?: Yes  Current Rx insurance plan: [employer] [place] Name and location of Current pharmacy:  [place], [address] [place] [address] Phone: [phone] Fax: [phone]  [id number] [place]: [phone]: [phone] [facility] services reviewed with patient today?: No  Patient requests to transfer care to [facility]?: No  Reason patient declined to change pharmacies: Disadvantaged due to insurance/mail order and Loyalty to other pharmacy/Patient preference   ASSESSMENT / PLAN  Hypertension / CKD stage 3b (BP goal <140/90) -Not ideally controlled - pt started isosorbide a few weeks ago given high SBP/low DBP. Home BP shows pattern of high SBP, normal DBP now.  -Current home readings: pt bought new monitor 04/2021 ([employer]) 3/7 127/86  3/6 168/75 3/5 153/72 3/4 163/77 3/3 154/66 3/2 172/80 -Current treatment: Amlodipine 10 mg daily AM - Appropriate, Query Effective Olmesartan 40 mg daily AM- Appropriate, Query Effective Furosemide 20 mg daily AM - Appropriate, Effective, Safe, Accessible Isosorbide MN 30 mg - 1/2 tab daily -Appropriate, Query Effective -Medications previously tried: atenolol, triamterene/htctz, benazepril (cough), metoprolol -Educated on BP goals and benefits of medications for  prevention of heart attack, stroke and kidney damage; Importance of home blood pressure monitoring; Proper BP monitoring technique; -Reviewed Avastin eye injections started 05/2020, some studies show even ophthalmic injections can effect BP -Previously we were avoiding diuretics d/t low renal function, it may be reasonable to use thiazide diuretic in future if unable to control BP with other options (diuretic effect is lessened by low GFR, but some studies suggest antihypertensive effect is maintained even at low GFR) -Counseled to monitor BP daily 2 hrs after medications -Advised to increase isosorbide to 30 mg (whole tablet); keep upcoming appt with PCP; contact provider with s/sx hypotension  Hyperlipidemia: (LDL goal < 100) -Controlled - LDL 50 (03/2022) at goal, TRIG slightly elevated at 169; pt endorses compliance with statin and denies side effects -Current treatment: Rosuvastatin 10 mg - 1/2 tab daily HS - Appropriate, Effective, Safe, Accessible -Educated on Cholesterol goals; Benefits of statin for ASCVD risk reduction -Recommended to continue current medication  IBS / GERD (Goal: manage symptoms) -Stable - pt reports using loperamide before she leaves the house "just in case"; she has seen GI specialist previously and has been told to forego further colonoscopies; she denies issues with GERD -Current treatment  Dicyclomine 20 mg BID -Appropriate, Effective, Safe, Accessible Loperamide 2 mg PRN -Appropriate, Effective, Safe, Accessible Omeprazole 20 mg daily - Appropriate, Effective, Safe,  Accessible Probiotic -Appropriate, Effective, Safe, Accessible -Medications previously tried: simethicone -Recommended to continue current medication  Migraine headache (Goal: manage symptoms) -stable - previously pt reports near daily headaches;  she reports these have improved; her migraines have previously affected her motor skills/balance -Current treatment  Cyclobenzaprine 10 mg PRN -  Appropriate, Effective, Safe, Accessible Tylenol 500 mg PRN - Appropriate, Effective, Safe, Accessible -Medications previously tried: tramadol, gabapentin, atenolol -Previously Educated on migraine prevention options - beta blockers, amitriptyline, topiramate are first line option; pt has not tolerated atenolol due to low pulse; would avoid amitriptyline due to anticholinergic properties and age; would avoid topiramate due to potential to worsen cognitive issues; CGRP antagonists would be a good option -Can consider CGRP-antagonist [facility] surgeon) for migraine prevention if desired   Charlene Brooke, PharmD, BCACP Clinical Pharmacist [facility] at [facility] [phone]

## 2022-07-27 DIAGNOSIS — R8281 Pyuria: Secondary | ICD-10-CM | POA: Diagnosis not present

## 2022-07-27 DIAGNOSIS — I10 Essential (primary) hypertension: Secondary | ICD-10-CM | POA: Diagnosis not present

## 2022-07-27 DIAGNOSIS — R600 Localized edema: Secondary | ICD-10-CM | POA: Diagnosis not present

## 2022-07-27 DIAGNOSIS — I129 Hypertensive chronic kidney disease with stage 1 through stage 4 chronic kidney disease, or unspecified chronic kidney disease: Secondary | ICD-10-CM | POA: Diagnosis not present

## 2022-07-27 DIAGNOSIS — R809 Proteinuria, unspecified: Secondary | ICD-10-CM | POA: Diagnosis not present

## 2022-07-27 DIAGNOSIS — N1832 Chronic kidney disease, stage 3b: Secondary | ICD-10-CM | POA: Diagnosis not present

## 2022-07-28 ENCOUNTER — Other Ambulatory Visit: Payer: Self-pay | Admitting: Nephrology

## 2022-07-28 DIAGNOSIS — I129 Hypertensive chronic kidney disease with stage 1 through stage 4 chronic kidney disease, or unspecified chronic kidney disease: Secondary | ICD-10-CM

## 2022-07-28 DIAGNOSIS — N1832 Chronic kidney disease, stage 3b: Secondary | ICD-10-CM

## 2022-07-28 DIAGNOSIS — R600 Localized edema: Secondary | ICD-10-CM

## 2022-07-28 DIAGNOSIS — I10 Essential (primary) hypertension: Secondary | ICD-10-CM

## 2022-07-28 DIAGNOSIS — R809 Proteinuria, unspecified: Secondary | ICD-10-CM

## 2022-07-29 ENCOUNTER — Telehealth: Payer: Self-pay

## 2022-07-29 NOTE — Patient Outreach (Signed)
  Care Coordination   Initial Visit Note   07/29/2022 Name: Ann Cummings MRN: [id number] DOB: May 31, 1940  Ann Cummings is a 82 y.o. year old female who sees Tower, [name], MD for primary care. I spoke with  Ann Cummings by phone today.  What matters to the patients health and wellness today?  Patient verbalizes no nursing or community resource needs.  Declines care coordination follow up at this time.     Goals Addressed             This Visit's Progress    COMPLETED: care coordination activities - no further follow up required.       Interventions Today    Flowsheet Row Most Recent Value  General Interventions   General Interventions Discussed/Reviewed General Interventions Discussed  [Care coordination services discussed and offered. SDOH survey completed. AWV discussed. Advised to call PCP office to scheduled.  Encouraged patient to get current vaccinations.  Advised to take completed HCPOA to provider office]              SDOH assessments and interventions completed:  Yes  SDOH Interventions Today    Flowsheet Row Most Recent Value  SDOH Interventions   Food Insecurity Interventions Intervention Not Indicated  Housing Interventions Intervention Not Indicated  Transportation Interventions Intervention Not Indicated        Care Coordination Interventions:  No, not indicated   Follow up plan: No further intervention required.   Encounter Outcome:  Pt. Visit Completed   Quinn Plowman RN,BSN,CCM [place] [phone] direct line

## 2022-07-31 ENCOUNTER — Ambulatory Visit
Admission: RE | Admit: 2022-07-31 | Discharge: 2022-07-31 | Disposition: A | Discharge status: Home / Self Care | Payer: Medicare Other | Source: Ambulatory Visit | Attending: Oncology | Admitting: Oncology

## 2022-07-31 DIAGNOSIS — Z1231 Encounter for screening mammogram for malignant neoplasm of breast: Secondary | ICD-10-CM | POA: Diagnosis not present

## 2022-08-05 ENCOUNTER — Other Ambulatory Visit: Payer: Self-pay | Admitting: Family Medicine

## 2022-08-05 ENCOUNTER — Inpatient Hospital Stay: Payer: Medicare Other | Attending: Oncology

## 2022-08-05 DIAGNOSIS — I10 Essential (primary) hypertension: Secondary | ICD-10-CM | POA: Diagnosis not present

## 2022-08-05 DIAGNOSIS — Z853 Personal history of malignant neoplasm of breast: Secondary | ICD-10-CM | POA: Diagnosis not present

## 2022-08-05 DIAGNOSIS — Z923 Personal history of irradiation: Secondary | ICD-10-CM | POA: Diagnosis not present

## 2022-08-05 DIAGNOSIS — Z79899 Other long term (current) drug therapy: Secondary | ICD-10-CM | POA: Diagnosis not present

## 2022-08-05 DIAGNOSIS — D649 Anemia, unspecified: Secondary | ICD-10-CM | POA: Insufficient documentation

## 2022-08-05 DIAGNOSIS — N189 Chronic kidney disease, unspecified: Secondary | ICD-10-CM | POA: Diagnosis not present

## 2022-08-05 DIAGNOSIS — D631 Anemia in chronic kidney disease: Secondary | ICD-10-CM

## 2022-08-05 DIAGNOSIS — Z170 Estrogen receptor positive status [ER+]: Secondary | ICD-10-CM

## 2022-08-05 LAB — COMPREHENSIVE METABOLIC PANEL
ALT: 11 U/L (ref 0–44)
AST: 18 U/L (ref 15–41)
Albumin: 3.6 g/dL (ref 3.5–5.0)
Alkaline Phosphatase: 43 U/L (ref 38–126)
Anion gap: 8 (ref 5–15)
BUN: 29 mg/dL — ABNORMAL HIGH (ref 8–23)
CO2: 27 mmol/L (ref 22–32)
Calcium: 8.7 mg/dL — ABNORMAL LOW (ref 8.9–10.3)
Chloride: 100 mmol/L (ref 98–111)
Creatinine, Ser: 1.56 mg/dL — ABNORMAL HIGH (ref 0.44–1.00)
GFR, Estimated: 33 mL/min — ABNORMAL LOW (ref >60)
Glucose, Bld: 76 mg/dL (ref 70–99)
Potassium: 4.5 mmol/L (ref 3.5–5.1)
Sodium: 135 mmol/L (ref 135–145)
Total Bilirubin: 0.4 mg/dL (ref 0.3–1.2)
Total Protein: 6.7 g/dL (ref 6.5–8.1)

## 2022-08-05 LAB — RETIC PANEL
Immature Retic Fract: 13.1 % (ref 2.3–15.9)
RBC.: 3.74 MIL/uL — ABNORMAL LOW (ref 3.87–5.11)
Retic Count, Absolute: 69.2 10*3/uL (ref 19.0–186.0)
Retic Ct Pct: 1.9 % (ref 0.4–3.1)
Reticulocyte Hemoglobin: 30.6 pg (ref >27.9)

## 2022-08-05 LAB — CBC WITH DIFFERENTIAL/PLATELET
Abs Immature Granulocytes: 0.03 10*3/uL (ref 0.00–0.07)
Basophils Absolute: 0 10*3/uL (ref 0.0–0.1)
Basophils Relative: 0 %
Eosinophils Absolute: 0.3 10*3/uL (ref 0.0–0.5)
Eosinophils Relative: 4 %
HCT: 34.7 % — ABNORMAL LOW (ref 36.0–46.0)
Hemoglobin: 11.2 g/dL — ABNORMAL LOW (ref 12.0–15.0)
Immature Granulocytes: 0 %
Lymphocytes Relative: 20 %
Lymphs Abs: 2 10*3/uL (ref 0.7–4.0)
MCH: 29 pg (ref 26.0–34.0)
MCHC: 32.3 g/dL (ref 30.0–36.0)
MCV: 89.9 fL (ref 80.0–100.0)
Monocytes Absolute: 0.9 10*3/uL (ref 0.1–1.0)
Monocytes Relative: 9 %
Neutro Abs: 6.5 10*3/uL (ref 1.7–7.7)
Neutrophils Relative %: 67 %
Platelets: 377 10*3/uL (ref 150–400)
RBC: 3.86 MIL/uL — ABNORMAL LOW (ref 3.87–5.11)
RDW: 13.6 % (ref 11.5–15.5)
WBC: 9.8 10*3/uL (ref 4.0–10.5)
nRBC: 0 % (ref 0.0–0.2)

## 2022-08-05 LAB — IRON AND TIBC
Iron: 46 ug/dL (ref 28–170)
Saturation Ratios: 13 % (ref 10.4–31.8)
TIBC: 349 ug/dL (ref 250–450)
UIBC: 303 ug/dL

## 2022-08-05 LAB — FERRITIN: Ferritin: 20 ng/mL (ref 11–307)

## 2022-08-10 ENCOUNTER — Ambulatory Visit
Admission: RE | Admit: 2022-08-10 | Discharge: 2022-08-10 | Disposition: A | Discharge status: Home / Self Care | Payer: Medicare Other | Source: Ambulatory Visit | Attending: Nephrology | Admitting: Nephrology

## 2022-08-10 DIAGNOSIS — I129 Hypertensive chronic kidney disease with stage 1 through stage 4 chronic kidney disease, or unspecified chronic kidney disease: Secondary | ICD-10-CM | POA: Insufficient documentation

## 2022-08-10 DIAGNOSIS — N1832 Chronic kidney disease, stage 3b: Secondary | ICD-10-CM | POA: Insufficient documentation

## 2022-08-10 DIAGNOSIS — I10 Essential (primary) hypertension: Secondary | ICD-10-CM | POA: Diagnosis not present

## 2022-08-10 DIAGNOSIS — R600 Localized edema: Secondary | ICD-10-CM | POA: Insufficient documentation

## 2022-08-10 DIAGNOSIS — N281 Cyst of kidney, acquired: Secondary | ICD-10-CM | POA: Diagnosis not present

## 2022-08-10 DIAGNOSIS — R809 Proteinuria, unspecified: Secondary | ICD-10-CM | POA: Diagnosis not present

## 2022-08-10 DIAGNOSIS — N189 Chronic kidney disease, unspecified: Secondary | ICD-10-CM | POA: Diagnosis not present

## 2022-08-11 ENCOUNTER — Inpatient Hospital Stay (HOSPITAL_BASED_OUTPATIENT_CLINIC_OR_DEPARTMENT_OTHER): Payer: Medicare Other | Admitting: Oncology

## 2022-08-11 ENCOUNTER — Encounter: Payer: Self-pay | Admitting: Oncology

## 2022-08-11 VITALS — BP 147/58 | HR 64 | Temp 98.9°F | Resp 18 | Wt 201.2 lb

## 2022-08-11 DIAGNOSIS — Z923 Personal history of irradiation: Secondary | ICD-10-CM | POA: Diagnosis not present

## 2022-08-11 DIAGNOSIS — Z79899 Other long term (current) drug therapy: Secondary | ICD-10-CM | POA: Diagnosis not present

## 2022-08-11 DIAGNOSIS — N189 Chronic kidney disease, unspecified: Secondary | ICD-10-CM | POA: Diagnosis not present

## 2022-08-11 DIAGNOSIS — C50412 Malignant neoplasm of upper-outer quadrant of left female breast: Secondary | ICD-10-CM

## 2022-08-11 DIAGNOSIS — I10 Essential (primary) hypertension: Secondary | ICD-10-CM | POA: Diagnosis not present

## 2022-08-11 DIAGNOSIS — Z170 Estrogen receptor positive status [ER+]: Secondary | ICD-10-CM

## 2022-08-11 DIAGNOSIS — D631 Anemia in chronic kidney disease: Secondary | ICD-10-CM

## 2022-08-11 DIAGNOSIS — N1832 Chronic kidney disease, stage 3b: Secondary | ICD-10-CM | POA: Diagnosis not present

## 2022-08-11 DIAGNOSIS — Z853 Personal history of malignant neoplasm of breast: Secondary | ICD-10-CM | POA: Diagnosis not present

## 2022-08-11 DIAGNOSIS — D649 Anemia, unspecified: Secondary | ICD-10-CM | POA: Diagnosis not present

## 2022-08-11 NOTE — Progress Notes (Signed)
Pt here for follow up. No new concerns voiced.   
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
## 2022-08-11 NOTE — Assessment & Plan Note (Signed)
Labs are reviewed and discussed with patient. Hb slightly decreased.  Iron panel shows normal iron level.  In the context of chronic kidney disease, recommend patient to take vitamin C daily.  No need for erythropoietin replacement therapy currently.

## 2022-08-11 NOTE — Assessment & Plan Note (Addendum)
#  Stage IA breast cancer, declined adjuvant aromatase inhibitor.  Clinically she is doing well. Labs are reviewed and discussed with patient. mammogram in March 2024 results reviewed with patient.  Negative for mammographic evidence of cancer recurrence. I recommend patient to continue mammogram annually. It has been 5 years since patient's surgery, patient elects to be discharged from our clinic.  Patient plans to ask primary care provider to order her annual mammogram.

## 2022-08-18 ENCOUNTER — Ambulatory Visit (INDEPENDENT_AMBULATORY_CARE_PROVIDER_SITE_OTHER): Payer: Medicare Other | Admitting: Family Medicine

## 2022-08-18 ENCOUNTER — Encounter: Payer: Self-pay | Admitting: Family Medicine

## 2022-08-18 VITALS — BP 151/60 | HR 74 | Temp 98.2°F | Ht 63.5 in | Wt 200.4 lb

## 2022-08-18 DIAGNOSIS — F419 Anxiety disorder, unspecified: Secondary | ICD-10-CM

## 2022-08-18 DIAGNOSIS — I10 Essential (primary) hypertension: Secondary | ICD-10-CM

## 2022-08-18 DIAGNOSIS — F430 Acute stress reaction: Secondary | ICD-10-CM

## 2022-08-18 DIAGNOSIS — N1832 Chronic kidney disease, stage 3b: Secondary | ICD-10-CM | POA: Diagnosis not present

## 2022-08-18 DIAGNOSIS — N1831 Chronic kidney disease, stage 3a: Secondary | ICD-10-CM

## 2022-08-18 DIAGNOSIS — R600 Localized edema: Secondary | ICD-10-CM

## 2022-08-18 DIAGNOSIS — F32A Depression, unspecified: Secondary | ICD-10-CM | POA: Diagnosis not present

## 2022-08-18 DIAGNOSIS — G43109 Migraine with aura, not intractable, without status migrainosus: Secondary | ICD-10-CM

## 2022-08-18 DIAGNOSIS — D631 Anemia in chronic kidney disease: Secondary | ICD-10-CM | POA: Diagnosis not present

## 2022-08-18 NOTE — Assessment & Plan Note (Signed)
Likely worsened by amlodipine No imp with brief inc in lasix

## 2022-08-18 NOTE — Assessment & Plan Note (Signed)
Rev nephrology notes Bp is not at goal If labs are imp will likely re start olmesartan at next visit
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
## 2022-08-18 NOTE — Patient Instructions (Addendum)
Keep working on eating less processed foods .Try to get most of your carbohydrates from produce (with the exception of white potatoes)  Eat less bread/pasta/rice/snack foods/cereals/sweets and other items from the middle of the grocery store (processed carbs)    Look at medicines at home  Are you taking the olmesartan ? ( I don't think you should be)   Go back to the isosorbide (imdur) to 15 mg instead of 30 because it may be worsening headaches     You may need a new bp cuff  Bring it next time   Let us know if you would be interested in mental health counseling   Try to socialize more  Try to go out more -this will ultimately help the anxiety   Think about writing in a journal to help the bottled up emotions   See the nephrologist as planned later this month

## 2022-08-18 NOTE — Assessment & Plan Note (Signed)
Monitored by nephrology and oncology  Last hb 11.2  Iron level nl last mo  Taking vit C to help iron absorb

## 2022-08-18 NOTE — Assessment & Plan Note (Signed)
Bp tends to be higher at home  Not opt control BP: (!) 151/60  Managed by nephrology  Taking  Lasix 20 mg daily (bid did not help edema) Amlodipine 10 -likely causing edema  Isosorbide-causing worse ha at 30 mg so will cut back to 15 For f/u with nephrology later this mo- may re start olmesartan at that time depending on labs  Enc low sodium diet and exercise as tolerated  Rev recent nephrology notes and recent renal US

## 2022-08-18 NOTE — Assessment & Plan Note (Addendum)
Worse anxiety recently  Reviewed stressors/ coping techniques/symptoms/ support sources/ tx options and side effects in detail today Declines counseling Does not like to talk  Enc her to journal / not likely  Is somewhat agoraphobic Continues celexa  Not open to adding tx at this time  Family is aware  PHQ12 and GAD 7

## 2022-08-18 NOTE — Assessment & Plan Note (Signed)
Worse with inc isosorbide  Will cut it back down to 15 mg

## 2022-08-25 DIAGNOSIS — N1832 Chronic kidney disease, stage 3b: Secondary | ICD-10-CM | POA: Diagnosis not present

## 2022-08-25 DIAGNOSIS — I10 Essential (primary) hypertension: Secondary | ICD-10-CM | POA: Diagnosis not present

## 2022-08-25 DIAGNOSIS — I129 Hypertensive chronic kidney disease with stage 1 through stage 4 chronic kidney disease, or unspecified chronic kidney disease: Secondary | ICD-10-CM | POA: Diagnosis not present

## 2022-08-25 DIAGNOSIS — R600 Localized edema: Secondary | ICD-10-CM | POA: Diagnosis not present

## 2022-08-25 DIAGNOSIS — R809 Proteinuria, unspecified: Secondary | ICD-10-CM | POA: Diagnosis not present

## 2022-08-27 ENCOUNTER — Encounter: Payer: Self-pay | Admitting: Oncology

## 2022-08-27 DIAGNOSIS — H35372 Puckering of macula, left eye: Secondary | ICD-10-CM | POA: Diagnosis not present

## 2022-08-27 DIAGNOSIS — H35033 Hypertensive retinopathy, bilateral: Secondary | ICD-10-CM | POA: Diagnosis not present

## 2022-08-27 DIAGNOSIS — H353211 Exudative age-related macular degeneration, right eye, with active choroidal neovascularization: Secondary | ICD-10-CM | POA: Diagnosis not present

## 2022-08-27 DIAGNOSIS — H35412 Lattice degeneration of retina, left eye: Secondary | ICD-10-CM | POA: Diagnosis not present

## 2022-08-27 DIAGNOSIS — H43813 Vitreous degeneration, bilateral: Secondary | ICD-10-CM | POA: Diagnosis not present

## 2022-08-27 DIAGNOSIS — H34832 Tributary (branch) retinal vein occlusion, left eye, with macular edema: Secondary | ICD-10-CM | POA: Diagnosis not present

## 2022-08-28 ENCOUNTER — Telehealth: Payer: Self-pay

## 2022-08-28 NOTE — Progress Notes (Signed)
Care Management & Coordination Services Pharmacy Team  Reason for Encounter: Appointment Reminder  Contacted patient to confirm telephone appointment with Al Corpus, PharmD on 09/02/2022 at 3:45.  Spoke with patient on 08/28/2022   Do you have any problems getting your medications? No  What is your top health concern you would like to discuss at your upcoming visit? Patient did not have any concerns at this time.  Have you seen any other providers since your last visit with PCP? No  Star Rating Drugs:  Medication:  Last Fill: Day Supply Rosuvastatin 10 mg 07/16/2022 90  Care Gaps: Annual wellness visit in last year? Yes 06/04/2022  Al Corpus, PharmD notified  Claudina Lick, RMA Clinical Pharmacy Assistant [phone]

## 2022-09-01 ENCOUNTER — Other Ambulatory Visit: Payer: Self-pay | Admitting: Family Medicine

## 2022-09-02 ENCOUNTER — Ambulatory Visit: Payer: Medicare Other | Admitting: Pharmacist

## 2022-09-02 NOTE — Progress Notes (Unsigned)
Care Management & Coordination Services Pharmacy Note  09/02/2022 Name:  Ann Cummings MRN:  [id number] DOB:  19-Mar-1941  Summary: F/U visit -HTN: BP elevated at home despite addition of isosorbide 15 mg 3 weeks ago. She endorses compliance with amlodipine, olmesartan, furosemide as well; per home reading SBP remains elevated, DBP is generally normal 3/7 127/86  3/6 168/75 3/5 153/72 3/4 163/77 3/3 154/66 3/2 172/80  Recommendations/Changes made from today's visit: -Increase isosorbide to 30 mg daily (whole tablet); f/u with PCP as scheduled -Though previously avoiding diuretic d/t renal function (GFR 32), may consider thiazide diuretic in future if BP cannot be controlled - studies show thiazides can maintain antihypertensive effect even with low GFR, while they lose diuretic effect  Follow up plan: -Pharmacist follow up televisit scheduled for 6 weeks -Oncology appt 08/11/22; PCP appt 08/18/22    Subjective: Ann Cummings is an 82 y.o. year old female who is a primary patient of Tower, Audrie Gallus, MD.  The care coordination team was consulted for assistance with disease management and care coordination needs.    Engaged with patient by telephone for follow up visit.  Patient Care Team: Tower, Audrie Gallus, MD as PCP - General Magnus Ivan Vanita Panda, MD as Consulting Physician (Orthopedic Surgery) Julio Sicks, MD as Consulting Physician (Neurosurgery) Debbrah Alar, MD as Consulting Physician (Dermatology) Rickard Patience, MD as Consulting Physician (Hematology and Oncology) Kathyrn Sheriff, [facility] as Pharmacist (Pharmacist)  Recent office visits: 08/18/22 Dr Milinda Antis OV: HTN - BP 151/60. May restart olmesartan after next labs. Reduce Imdur back to 1/2 tab.  06/30/22 Dr Milinda Antis OV: BP - BP 145/60. Start isosorbide 15 mg (consult with PharmD)  06/12/21 Dr Milinda Antis OV: BP 144/61- D/C losartan, metoprolol. Start Olmesartan 40 mg.  05/12/22 Dr Milinda Antis OV: f/u - BP 152/65. Rx metoprolol XL 25  mg. 04/14/22 Dr Milinda Antis OV: annual - BP 146/58. Change benazepril to losartan 100 mg d/t cough. F/u 2-4 wk.   Recent consult visits: 08/11/22 Dr Cathie Hoops (Oncology): f/u breast ca - stable. Discharge from clinic.  08/10/22 Dr Thedore Mins (Nephrology): Increase furosemide to BID for 1 week  07/27/22 Dr Thedore Mins (Nephrology): CKD - repeat renal panel, [place].  02/04/22 Dr Cathie Hoops (Heme/onc): f/u breast ca - stable. Acute cough - xray wnl.  08/01/21 NP Francoise Schaumann Freida Busman (Heme/onc): f/u breast cancer, IDA. Declined aromatase inhibitor. Mammogram no evidence of recurrence. HGB at baseline.  Hospital visits: None in previous 6 months   Objective:  Lab Results  Component Value Date   CREATININE 1.56 (H) 08/05/2022   BUN 29 (H) 08/05/2022   GFR 32.04 (L) 06/30/2022   GFRNONAA 33 (L) 08/05/2022   GFRAA 46 (L) 01/17/2020   NA 135 08/05/2022   K 4.5 08/05/2022   CALCIUM 8.7 (L) 08/05/2022   CO2 27 08/05/2022   GLUCOSE 76 08/05/2022    Lab Results  Component Value Date/Time   HGBA1C 6.2 04/07/2022 08:18 AM   HGBA1C 6.0 03/21/2021 10:54 AM   GFR 32.04 (L) 06/30/2022 10:31 AM   GFR 36.64 (L) 04/07/2022 08:18 AM   MICROALBUR 1.6 04/24/2009 08:42 AM   MICROALBUR 9.6 (H) 04/17/2008 10:08 AM    Last diabetic Eye exam:  Lab Results  Component Value Date/Time   HMDIABEYEEXA Retinopathy (A) 05/07/2020 12:00 AM    Last diabetic Foot exam:  Lab Results  Component Value Date/Time   HMDIABFOOTEX yes 08/21/2009 12:00 AM     Lab Results  Component Value Date   CHOL 131 04/07/2022  HDL 47.10 04/07/2022   LDLCALC 50 04/07/2022   LDLDIRECT 61.0 03/21/2021   TRIG 169.0 (H) 04/07/2022   CHOLHDL 3 04/07/2022       Latest Ref Rng & Units 08/05/2022   10:48 AM 04/07/2022    8:18 AM 02/02/2022   10:50 AM  Hepatic Function  Total Protein 6.5 - 8.1 g/dL 6.7  6.3  6.6   Albumin 3.5 - 5.0 g/dL 3.6  4.0  3.6   AST 15 - 41 U/L 18  13  14    ALT 0 - 44 U/L 11  9  11    Alk Phosphatase 38 - 126 U/L 43  48  42   Total  Bilirubin 0.3 - 1.2 mg/dL 0.4  0.3  0.3     Lab Results  Component Value Date/Time   TSH 1.99 04/07/2022 08:18 AM   TSH 1.32 03/21/2021 10:54 AM       Latest Ref Rng & Units 08/05/2022   10:48 AM 04/07/2022    8:18 AM 02/02/2022   10:50 AM  CBC  WBC 4.0 - 10.5 K/uL 9.8  9.7  8.9   Hemoglobin 12.0 - 15.0 g/dL 72.5  36.6  44.0   Hematocrit 36.0 - 46.0 % 34.7  34.5  33.2   Platelets 150 - 400 K/uL 377  351.0  311     Lab Results  Component Value Date/Time   VD25OH 21 (L) 05/16/2009 10:15 PM   VITAMINB12 1,234 (H) 04/07/2022 08:18 AM   VITAMINB12 819 03/21/2021 10:54 AM    Clinical ASCVD: No  The ASCVD Risk score (Arnett DK, et al., 2019) failed to calculate for the following reasons:   The 2019 ASCVD risk score is only valid for ages 38 to 67   The patient has a prior MI or stroke diagnosis       08/18/2022   10:07 AM 06/30/2022   10:38 AM 06/04/2022   12:46 PM  Depression screen PHQ 2/9  Decreased Interest 2 1 0  Down, Depressed, Hopeless 1 1 0  PHQ - 2 Score 3 2 0  Altered sleeping 0 0   Tired, decreased energy 2 1   Change in appetite 2 0   Feeling bad or failure about yourself  1 1   Trouble concentrating 2 0   Moving slowly or fidgety/restless 1 0   Suicidal thoughts 1 1   PHQ-9 Score 12 5   Difficult doing work/chores Not difficult at all Not difficult at all      Social History   Tobacco Use  Smoking Status Never  Smokeless Tobacco Never   BP Readings from Last 3 Encounters:  08/18/22 (!) 151/60  08/11/22 (!) 147/58  06/30/22 (!) 145/60   Pulse Readings from Last 3 Encounters:  08/18/22 74  08/11/22 64  06/30/22 83   Wt Readings from Last 3 Encounters:  08/18/22 200 lb 6 oz (90.9 kg)  08/11/22 201 lb 3.2 oz (91.3 kg)  06/30/22 202 lb (91.6 kg)   BMI Readings from Last 3 Encounters:  08/18/22 34.94 kg/m  08/11/22 35.08 kg/m  06/30/22 35.22 kg/m    Allergies  Allergen Reactions   Calcium     REACTION: severe constipation   Cetirizine  Hcl     REACTION: reaction not known   Codeine     Per pt elevated blood pressure and caused haedache   Gabapentin     REACTION: Confussion   Metoprolol     Ankle swelling Dizzy headaches  Mometasone Furoate     REACTION: not effective   Prednisone Diarrhea    Stomach swollen and IBS   Ropinirole Hydrochloride     REACTION: lightheaded and felt like going to pass out   Ampicillin Rash    Rash all over body    Medications Reviewed Today     Reviewed by Judy Pimple, MD (Physician) on 08/18/22 at 1005  Med List Status: <None>   Medication Order Taking? Sig Documenting Provider Last Dose Status Informant  acetaminophen (TYLENOL) 500 MG tablet [id number] Yes Take 500 mg by mouth every 6 (six) hours as needed (FOR PAIN.).  [provider] Taking Active Self  albuterol (VENTOLIN HFA) 108 (90 Base) MCG/ACT inhaler [id number] Yes Inhale 2 puffs into the lungs every 4 (four) hours as needed for wheezing. Tower, Audrie Gallus, MD Taking Active   amLODipine (NORVASC) 10 MG tablet [id number] Yes TAKE 1 TABLET BY MOUTH EVERY DAY Tower, Audrie Gallus, MD Taking Active   Bacillus Coagulans-Inulin (PROBIOTIC) 1-250 BILLION-MG CAPS [id number] Yes Take by mouth. [provider] Taking Active   Cholecalciferol 25 MCG (1000 UT) capsule [id number] Yes Take 1,000 Units by mouth. [provider] Taking Active   citalopram (CELEXA) 40 MG tablet [id number] Yes TAKE 1 TABLET BY MOUTH EVERY DAY AT BEDTIME Tower, Audrie Gallus, MD Taking Active   cyclobenzaprine (FLEXERIL) 10 MG tablet [id number] Yes TAKE 1/2 TO 1 PILL UP TO 3 TIMES DAILY AS NEEDED FOR HEADACHE OR MUSCLE SPASM, CAUTION OF SEDATION Tower, Audrie Gallus, MD Taking Active   dicyclomine (BENTYL) 20 MG tablet [id number] Yes TAKE ONE TABLET BY MOUTH TWICE A DAY BEFORE A MEAL. Tower, Audrie Gallus, MD Taking Active   diphenhydrAMINE-zinc acetate (BENADRYL ITCH STOPPING) cream [id number] Yes Apply 1 application. topically daily as needed for itching. [provider] Taking Active   fexofenadine (ALLEGRA) 180 MG tablet [id number] Yes Take 180 mg by mouth daily as needed. [provider] Taking Active   furosemide (LASIX) 20 MG tablet [id number] Yes TAKE 1 TABLET BY MOUTH EVERY DAY  Patient taking differently: Take 40 mg by mouth daily. 2 tabs a day for 1 week   Tower, Audrie Gallus, MD Taking Active   glucose blood (ONETOUCH VERIO) test strip [id number] Yes Use one daily to check blood sugar (Dx. Code:  R73.03) Tower, Audrie Gallus, MD Taking Active   hydrocortisone cream 1 % [id number] Yes Apply 1 application topically 2 (two) times daily. [provider] Taking Active   isosorbide mononitrate (IMDUR) 30 MG 24 hr tablet [id number] Yes Take 0.5 tablets (15 mg total) by mouth daily. Tower, Audrie Gallus, MD Taking Active   Ketotifen Fumarate (ALAWAY OP) [id number] Yes Place 1-2 drops into both eyes 3 (three) times daily as needed (for eye irritation.).  [provider] Taking Active Self  Lancets Letta [place] PLUS [place]) MISC [id number] Yes CHECK BLOOD SUGAR EVERY DAY Tower, Audrie Gallus, MD Taking Active   loperamide (IMODIUM) 2 MG capsule [id number] Yes Take 2-4 mg by mouth 4 (four) times daily as needed for diarrhea or loose stools. [provider] Taking Active Self  meclizine (ANTIVERT) 25 MG tablet [id number] Yes Take 25 mg by mouth 3 (three) times daily as needed (for dizziness/vertigo). [provider] Taking Active Self  olmesartan (BENICAR) 40 MG tablet [id number] Yes Take 1 tablet (40 mg total) by mouth daily. Tower, Audrie Gallus, MD Taking Active   omeprazole (PRILOSEC) 20 MG capsule [id number] Yes TAKE 1 CAPSULE BY MOUTH EVERY DAY  Tower, Audrie Gallus, MD Taking Active   [facility] LANCETS FINE [place] [id number] Yes 1 each by Other route as directed. CHECK BLOOD GLUCOSE ONCE DAILY AND AS DIRECTED FOR DIABETES MELLITIS Tower, Audrie Gallus, MD Taking Active Self  rosuvastatin (CRESTOR) 10 MG tablet [id number] Yes TAKE 1/2 TABLET BY MOUTH  EVERY DAY Tower, Audrie Gallus, MD Taking Active   sodium chloride (MURO 128) 5 % ophthalmic solution [id number] Yes Place 1 drop into both eyes 4 (four) times daily as needed for eye irritation.  [provider] Taking Active Self  Triamcinolone Acetonide (NASACORT ALLERGY 24HR NA) [id number] Yes Place 1 spray into the nose daily as needed (for allergies.). [provider] Taking Active Self           Med Note Katrinka Blazing, Macarthur Critchley   Wed Apr 05, 2019  1:04 PM)    triamcinolone cream (KENALOG) 0.1 % [id number] Yes Apply 1 Application topically 2 (two) times daily. Tower, Audrie Gallus, MD Taking Active   vitamin B-12 (CYANOCOBALAMIN) 1000 MCG tablet [id number] Yes Take 1,000 mcg by mouth at bedtime. [provider] Taking Active Self            Patient Active Problem List   Diagnosis Date Noted   Stress reaction 06/30/2022   Anemia in chronic kidney disease (CKD) 02/04/2022   Current use of proton pump inhibitor 03/21/2021   Macular degeneration, wet 09/16/2020   Anemia due to stage 3a chronic kidney disease 07/30/2020   Chronic cough 02/27/2020   Dysphagia 02/27/2020   Migraine with aura 02/22/2018   Goals of care, counseling/discussion 10/14/2017   Malignant neoplasm of upper-outer quadrant of left breast in female, estrogen receptor positive 08/02/2017   Intertrigo 02/19/2016   Class 2 obesity due to excess calories with body mass index (BMI) of 35.0 to 35.9 in adult 08/02/2015   Encounter for Medicare annual wellness exam 01/09/2014   Chronic kidney disease, stage 3b 01/09/2014   Eczema 07/12/2013   Pedal edema 01/06/2013   Allergic rhinitis 08/21/2009   Iron deficiency anemia 07/09/2009   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   History of TV adenoma of colon 06/07/2009   OBSTRUCTIVE SLEEP APNEA 05/31/2009   ANEMIA, B12 DEFICIENCY 05/24/2009   Anxiety and depression 05/15/2009   MEMORY LOSS 04/30/2009   Prediabetes 10/26/2007   LIPOMA, BACK 10/01/2006    Hyperlipidemia 10/01/2006   CARPAL TUNNEL SYNDROME 10/01/2006   Essential hypertension 10/01/2006   IBS 10/01/2006   FIBROCYSTIC BREAST DISEASE 10/01/2006   OSTEOARTHRITIS 10/01/2006   SPINAL STENOSIS 10/01/2006   BACK PAIN, CHRONIC 10/01/2006   MIGRAINES, HX OF 10/01/2006    Immunization History  Administered Date(s) Administered   Fluad Quad(high Dose 65+) 02/28/2019, 02/27/2020   Influenza Whole 02/25/2005, 04/30/2009, 03/17/2010   Influenza,inj,Quad PF,6+ Mos 02/10/2013, 01/09/2014, 02/01/2015, 02/03/2016, 02/10/2017, 03/03/2018, 03/21/2021   PFIZER(Purple Top)SARS-COV-2 Vaccination 11/02/2019, 11/23/2019   Pneumococcal Conjugate-13 01/09/2014   Pneumococcal Polysaccharide-23 04/23/2008   Td 04/13/1995, 10/14/2006     Compliance/Adherence/Medication fill history: Care Gaps: None  Star-Rating Drugs: Losartan - PDC 100%  SDOH:  (Social Determinants of Health) assessments and interventions performed: No - pt has AWV this month Jan 2024, will address then SDOH Interventions    Flowsheet Row Telephone from 07/29/2022 in [facility] Office Visit from 06/30/2022 in [facility] [place] Clinical Support from 06/04/2022 in [facility] Clinical Support from 02/10/2017 in [facility] [place]  SDOH Interventions      Food Insecurity Interventions Intervention Not Indicated -- Intervention Not Indicated --  Housing Interventions Intervention Not Indicated -- Intervention Not Indicated --  Transportation Interventions Intervention Not Indicated -- Intervention Not Indicated --  Utilities Interventions -- -- Intervention Not Indicated --  Depression Interventions/Treatment  -- Medication -- --  [referral to PCP]  Financial Strain Interventions -- -- Intervention Not Indicated --  Stress Interventions -- -- Intervention Not Indicated --  Social Connections  Interventions -- -- Intervention Not Indicated --      SDOH Screenings   Food Insecurity: No Food Insecurity (07/29/2022)  Housing: Low Risk  (07/29/2022)  Transportation Needs: No Transportation Needs (07/29/2022)  Utilities: Not At Risk (06/04/2022)  Alcohol Screen: Low Risk  (06/03/2021)  Depression (PHQ2-9): High Risk (08/18/2022)  Financial Resource Strain: Low Risk  (06/04/2022)  Physical Activity: Inactive (06/03/2021)  Social Connections: Moderately Isolated (06/04/2022)  Stress: No Stress Concern Present (06/04/2022)  Tobacco Use: Low Risk  (08/18/2022)    Medication Assistance: None required.  Patient affirms current coverage meets needs.  Medication Access: Within the past 30 days, how often has patient missed a dose of medication? 0 Is a pillbox or other method used to improve adherence? Yes  Factors that may affect medication adherence? no barriers identified Are meds synced by current pharmacy? No  Are meds delivered by current pharmacy? No  Does patient experience delays in picking up medications due to transportation concerns? No   [facility] Services Reviewed: Is patient disadvantaged to use [facility]?: Yes  Current Rx insurance plan: BCBS PDP Name and location of Current pharmacy:  [facility] - [place], [place] - [address] [place] [address] Phone: [phone] Fax: [phone]  [id number] - [place], [place] - [address] [place] [address] Phone: [phone] Fax: [phone]  [facility] services reviewed with patient today?: No  Patient requests to transfer care to [facility]?: No  Reason patient declined to change pharmacies: Disadvantaged due to insurance/mail order and Loyalty to other pharmacy/Patient preference   ASSESSMENT / PLAN  Hypertension / CKD stage 3b (BP goal <140/90) -Not ideally controlled - pt started isosorbide a few weeks ago given high SBP/low  DBP. Home BP shows pattern of high SBP, normal DBP now.  -Current home readings: pt bought new monitor 04/2021 ([employer]) Not checking BP much, feeling very sick with allergies -Current treatment: Amlodipine 10 mg daily AM - Appropriate, Query Effective Olmesartan 40 mg daily AM - on hold Furosemide 20 mg daily AM - Appropriate, Effective, Safe, Accessible Isosorbide MN 30 mg - 1/2 tab daily -Appropriate, Query Effective -Medications previously tried: atenolol, triamterene/htctz, benazepril (cough), metoprolol -Educated on BP goals and benefits of medications for prevention of heart attack, stroke and kidney damage; Importance of home blood pressure monitoring; Proper BP monitoring technique; -Reviewed Avastin eye injections started 05/2020, some studies show even ophthalmic injections can effect BP -Previously we were avoiding diuretics d/t low renal function, it may be reasonable to use thiazide diuretic in future if unable to control BP with other options (diuretic effect is lessened by low GFR, but some studies suggest antihypertensive effect is maintained even at low GFR) -Counseled to monitor BP daily 2 hrs after medications -Recommend to continue current medication  Hyperlipidemia: (LDL goal < 100) -Controlled - LDL 50 (03/2022) at goal, TRIG slightly elevated at 169; pt endorses compliance with statin and denies side effects -Current treatment: Rosuvastatin 10 mg - 1/2 tab  daily HS - Appropriate, Effective, Safe, Accessible -Educated on Cholesterol goals; Benefits of statin for ASCVD risk reduction -Recommended to continue current medication  IBS / GERD (Goal: manage symptoms) -Stable - pt reports using loperamide before she leaves the house "just in case"; she has seen GI specialist previously and has been told to forego further colonoscopies; she denies issues with GERD -Current treatment  Dicyclomine 20 mg BID -Appropriate, Effective, Safe, Accessible Loperamide 2 mg PRN  -Appropriate, Effective, Safe, Accessible Omeprazole 20 mg daily - Appropriate, Effective, Safe, Accessible Probiotic -Appropriate, Effective, Safe, Accessible -Medications previously tried: simethicone -Recommended to continue current medication  Migraine headache (Goal: manage symptoms) -stable - previously pt reports near daily headaches;  she reports these have improved; her migraines have previously affected her motor skills/balance -Current treatment  Cyclobenzaprine 10 mg PRN - Appropriate, Effective, Safe, Accessible Tylenol 500 mg PRN - Appropriate, Effective, Safe, Accessible -Medications previously tried: tramadol, gabapentin, atenolol -Previously Educated on migraine prevention options - beta blockers, amitriptyline, topiramate are first line option; pt has not tolerated atenolol due to low pulse; would avoid amitriptyline due to anticholinergic properties and age; would avoid topiramate due to potential to worsen cognitive issues; CGRP antagonists would be a good option -Can consider CGRP-antagonist Environmental manager) for migraine prevention if desired  Allergies  -Uncontrolled - pt reports significant allergic symptoms with runny nose, cough, post-nasal drip -Current treatment  Coricidin HBP Cetirizine 10 mg daily -Medications previously tried: fexofenadine  -Advised to utilize steroid nasal spray (Flonase or Nasavort) as well -Recommended to continue current medication   Al Corpus, PharmD, BCACP Clinical Pharmacist [facility] [phone]

## 2022-09-08 DIAGNOSIS — R8281 Pyuria: Secondary | ICD-10-CM | POA: Diagnosis not present

## 2022-09-08 DIAGNOSIS — R809 Proteinuria, unspecified: Secondary | ICD-10-CM | POA: Diagnosis not present

## 2022-09-08 DIAGNOSIS — I129 Hypertensive chronic kidney disease with stage 1 through stage 4 chronic kidney disease, or unspecified chronic kidney disease: Secondary | ICD-10-CM | POA: Diagnosis not present

## 2022-09-08 DIAGNOSIS — N1832 Chronic kidney disease, stage 3b: Secondary | ICD-10-CM | POA: Diagnosis not present

## 2022-09-08 DIAGNOSIS — R600 Localized edema: Secondary | ICD-10-CM | POA: Diagnosis not present

## 2022-09-08 DIAGNOSIS — N2581 Secondary hyperparathyroidism of renal origin: Secondary | ICD-10-CM | POA: Diagnosis not present

## 2022-09-08 DIAGNOSIS — N281 Cyst of kidney, acquired: Secondary | ICD-10-CM | POA: Diagnosis not present

## 2022-09-08 DIAGNOSIS — I10 Essential (primary) hypertension: Secondary | ICD-10-CM | POA: Diagnosis not present

## 2022-10-08 DIAGNOSIS — H353211 Exudative age-related macular degeneration, right eye, with active choroidal neovascularization: Secondary | ICD-10-CM | POA: Diagnosis not present

## 2022-10-15 ENCOUNTER — Other Ambulatory Visit: Payer: Self-pay | Admitting: Family Medicine

## 2022-10-15 ENCOUNTER — Ambulatory Visit: Payer: Medicare Other | Admitting: Pharmacist

## 2022-10-15 NOTE — Progress Notes (Signed)
Care Management & Coordination Services Pharmacy Note  10/15/2022 Name:  Ann Cummings MRN:  [id number] DOB:  02/03/1941  Summary: F/U visit -HTN: BP 130/68 at last nephrology appt (09/03/22); medications were adjusted - reduced amlodipine to 5 mg and started irbesartan 150 mg; pt has not been checking BP at home -Anemia: HGB 11.2, ferritin 20, TSAT 13 (07/2022); pt has had iron infusions through hematolog in the past; she has not tolerated oral iron well previosly and took 1 dose of Vitron C this week and had diarrhea -Yesterday feeling dizzy, off-balance, nausea. She took meclizine, tylenol and feels better today  Recommendations/Changes made from today's visit: -Advised to check BP once a day; goal BP < 140/90 -Discussed taking oral iron with food can help with GI effects; advised to try 1-2 doses per week; if not tolerated, contact hematology for possible repeat infusion  Follow up plan: -Health Concierge will call patient 1 month for BP update -Pharmacist follow up televisit scheduled for 3 months -Nephrology appt July 2024   Subjective: Ann Cummings is an 82 y.o. year old female who is a primary patient of Tower, Audrie Gallus, MD.  The care coordination team was consulted for assistance with disease management and care coordination needs.    Engaged with patient by telephone for follow up visit.  Patient Care Team: Tower, Audrie Gallus, MD as PCP - General Magnus Ivan Vanita Panda, MD as Consulting Physician (Orthopedic Surgery) Julio Sicks, MD as Consulting Physician (Neurosurgery) Debbrah Alar, MD as Consulting Physician (Dermatology) Rickard Patience, MD as Consulting Physician (Hematology and Oncology) Kathyrn Sheriff, [facility] as Pharmacist (Pharmacist)  Recent office visits: 08/18/22 Dr Milinda Antis OV: HTN - BP 151/60. May restart olmesartan after next labs. Reduce Imdur back to 1/2 tab  06/30/22 Dr Milinda Antis OV: BP - BP 145/60. Start isosorbide 15 mg (consult with PharmD)  06/12/21 Dr Milinda Antis  OV: BP 144/61- D/C losartan, metoprolol. Start Olmesartan 40 mg.  05/12/22 Dr Milinda Antis OV: f/u - BP 152/65. Rx metoprolol XL 25 mg. 04/14/22 Dr Milinda Antis OV: annual - BP 146/58. Change benazepril to losartan 100 mg d/t cough. F/u 2-4 wk.   Recent consult visits: 09/08/22 Dr Thedore Mins (Nephrology): reduce amlodipine to 5 mg. Start irbesartan 150 mg.  08/11/22 Dr Cathie Hoops (Oncology): f/u breast ca - stable. Discharge from clinic.  08/10/22 Dr Thedore Mins (Nephrology): Increase furosemide to BID for 1 week  07/27/22 Dr Thedore Mins (Nephrology): CKD - repeat renal panel, [place]. Hold olmesartan, add amlodipine.  02/04/22 Dr Cathie Hoops (Heme/onc): f/u breast ca - stable. Acute cough - xray wnl.  08/01/21 NP Francoise Schaumann Freida Busman (Heme/onc): f/u breast cancer, IDA. Declined aromatase inhibitor. Mammogram no evidence of recurrence. HGB at baseline.  Hospital visits: None in previous 6 months   Objective:  Lab Results  Component Value Date   CREATININE 1.56 (H) 08/05/2022   BUN 29 (H) 08/05/2022   GFR 32.04 (L) 06/30/2022   GFRNONAA 33 (L) 08/05/2022   GFRAA 46 (L) 01/17/2020   NA 135 08/05/2022   K 4.5 08/05/2022   CALCIUM 8.7 (L) 08/05/2022   CO2 27 08/05/2022   GLUCOSE 76 08/05/2022    Lab Results  Component Value Date/Time   HGBA1C 6.2 04/07/2022 08:18 AM   HGBA1C 6.0 03/21/2021 10:54 AM   GFR 32.04 (L) 06/30/2022 10:31 AM   GFR 36.64 (L) 04/07/2022 08:18 AM   MICROALBUR 1.6 04/24/2009 08:42 AM   MICROALBUR 9.6 (H) 04/17/2008 10:08 AM    Last diabetic Eye exam:  Lab Results  Component Value Date/Time   HMDIABEYEEXA Retinopathy (A) 05/07/2020 12:00 AM    Last diabetic Foot exam:  Lab Results  Component Value Date/Time   HMDIABFOOTEX yes 08/21/2009 12:00 AM     Lab Results  Component Value Date   CHOL 131 04/07/2022   HDL 47.10 04/07/2022   LDLCALC 50 04/07/2022   LDLDIRECT 61.0 03/21/2021   TRIG 169.0 (H) 04/07/2022   CHOLHDL 3 04/07/2022       Latest Ref Rng & Units 08/05/2022   10:48 AM 04/07/2022    8:18  AM 02/02/2022   10:50 AM  Hepatic Function  Total Protein 6.5 - 8.1 g/dL 6.7  6.3  6.6   Albumin 3.5 - 5.0 g/dL 3.6  4.0  3.6   AST 15 - 41 U/L 18  13  14    ALT 0 - 44 U/L 11  9  11    Alk Phosphatase 38 - 126 U/L 43  48  42   Total Bilirubin 0.3 - 1.2 mg/dL 0.4  0.3  0.3     Lab Results  Component Value Date/Time   TSH 1.99 04/07/2022 08:18 AM   TSH 1.32 03/21/2021 10:54 AM       Latest Ref Rng & Units 08/05/2022   10:48 AM 04/07/2022    8:18 AM 02/02/2022   10:50 AM  CBC  WBC 4.0 - 10.5 K/uL 9.8  9.7  8.9   Hemoglobin 12.0 - 15.0 g/dL 16.1  09.6  04.5   Hematocrit 36.0 - 46.0 % 34.7  34.5  33.2   Platelets 150 - 400 K/uL 377  351.0  311     Lab Results  Component Value Date/Time   VD25OH 21 (L) 05/16/2009 10:15 PM   VITAMINB12 1,234 (H) 04/07/2022 08:18 AM   VITAMINB12 819 03/21/2021 10:54 AM    Clinical ASCVD: No  The ASCVD Risk score (Arnett DK, et al., 2019) failed to calculate for the following reasons:   The 2019 ASCVD risk score is only valid for ages 54 to 67   The patient has a prior MI or stroke diagnosis       08/18/2022   10:07 AM 06/30/2022   10:38 AM 06/04/2022   12:46 PM  Depression screen PHQ 2/9  Decreased Interest 2 1 0  Down, Depressed, Hopeless 1 1 0  PHQ - 2 Score 3 2 0  Altered sleeping 0 0   Tired, decreased energy 2 1   Change in appetite 2 0   Feeling bad or failure about yourself  1 1   Trouble concentrating 2 0   Moving slowly or fidgety/restless 1 0   Suicidal thoughts 1 1   PHQ-9 Score 12 5   Difficult doing work/chores Not difficult at all Not difficult at all      Social History   Tobacco Use  Smoking Status Never  Smokeless Tobacco Never   BP Readings from Last 3 Encounters:  08/18/22 (!) 151/60  08/11/22 (!) 147/58  06/30/22 (!) 145/60   Pulse Readings from Last 3 Encounters:  08/18/22 74  08/11/22 64  06/30/22 83   Wt Readings from Last 3 Encounters:  08/18/22 200 lb 6 oz (90.9 kg)  08/11/22 201 lb 3.2 oz (91.3  kg)  06/30/22 202 lb (91.6 kg)   BMI Readings from Last 3 Encounters:  08/18/22 34.94 kg/m  08/11/22 35.08 kg/m  06/30/22 35.22 kg/m    Allergies  Allergen Reactions   Calcium     REACTION: severe constipation  Cetirizine Hcl     REACTION: reaction not known   Codeine     Per pt elevated blood pressure and caused haedache   Gabapentin     REACTION: Confussion   Metoprolol     Ankle swelling Dizzy headaches   Mometasone Furoate     REACTION: not effective   Prednisone Diarrhea    Stomach swollen and IBS   Ropinirole Hydrochloride     REACTION: lightheaded and felt like going to pass out   Ampicillin Rash    Rash all over body    Medications Reviewed Today     Reviewed by Kathyrn Sheriff, [facility] (Pharmacist) on 10/15/22 at 1517  Med List Status: <None>   Medication Order Taking? Sig Documenting Provider Last Dose Status Informant  acetaminophen (TYLENOL) 500 MG tablet [id number] Yes Take 500 mg by mouth every 6 (six) hours as needed (FOR PAIN.).  [provider] Taking Active Self  albuterol (VENTOLIN HFA) 108 (90 Base) MCG/ACT inhaler [id number] Yes Inhale 2 puffs into the lungs every 4 (four) hours as needed for wheezing. Tower, Audrie Gallus, MD Taking Active   amLODipine (NORVASC) 5 MG tablet [id number] Yes Take 5 mg by mouth daily. Mosetta Pigeon, MD Taking Active   Bacillus Coagulans-Inulin (PROBIOTIC) 1-250 BILLION-MG CAPS [id number] Yes Take by mouth. [provider] Taking Active   cetirizine (ZYRTEC) 10 MG tablet [id number] Yes Take 10 mg by mouth daily. [provider] Taking Active   Cholecalciferol 25 MCG (1000 UT) capsule [id number] Yes Take 1,000 Units by mouth. [provider] Taking Active   citalopram (CELEXA) 40 MG tablet [id number] Yes TAKE ONE TABLET BY MOUTH EVERY DAY AT BEDTIME Tower, Audrie Gallus, MD Taking Active   cyclobenzaprine (FLEXERIL) 10 MG tablet [id number] Yes TAKE 1/2 TO 1 PILL UP TO 3 TIMES DAILY AS NEEDED FOR  HEADACHE OR MUSCLE SPASM, CAUTION OF SEDATION Tower, Audrie Gallus, MD Taking Active   dicyclomine (BENTYL) 20 MG tablet [id number] Yes TAKE ONE TABLET BY MOUTH TWICE A DAY BEFORE A MEAL. Tower, Audrie Gallus, MD Taking Active   diphenhydrAMINE-zinc acetate (BENADRYL ITCH STOPPING) cream [id number] Yes Apply 1 application. topically daily as needed for itching. [provider] Taking Active   furosemide (LASIX) 20 MG tablet [id number] Yes TAKE 1 TABLET BY MOUTH EVERY DAY  Patient taking differently: Take 40 mg by mouth daily. 2 tabs a day for 1 week   Tower, Audrie Gallus, MD Taking Active   glucose blood (ONETOUCH VERIO) test strip [id number] Yes Use one daily to check blood sugar (Dx. Code:  R73.03) Tower, Audrie Gallus, MD Taking Active   hydrocortisone cream 1 % [id number] Yes Apply 1 application topically 2 (two) times daily. [provider] Taking Active   irbesartan (AVAPRO) 150 MG tablet [id number] Yes Take 150 mg by mouth daily. Mosetta Pigeon, MD Taking Active   isosorbide mononitrate (IMDUR) 30 MG 24 hr tablet [id number] Yes Take 0.5 tablets (15 mg total) by mouth daily. Tower, Audrie Gallus, MD Taking Active   Ketotifen Fumarate (ALAWAY OP) [id number] Yes Place 1-2 drops into both eyes 3 (three) times daily as needed (for eye irritation.).  [provider] Taking Active Self  Lancets Letta Pate [place] PLUS [place]) MISC [id number] Yes CHECK BLOOD SUGAR EVERY DAY Tower, Audrie Gallus, MD Taking Active   loperamide (IMODIUM) 2 MG capsule [id number] Yes Take 2-4 mg by mouth 4 (four) times daily as needed for diarrhea or loose stools. [provider] Taking Active Self  meclizine (ANTIVERT)  25 MG tablet [id number] Yes Take 25 mg by mouth 3 (three) times daily as needed (for dizziness/vertigo). [provider] Taking Active Self  omeprazole (PRILOSEC) 20 MG capsule [id number] Yes TAKE 1 CAPSULE BY MOUTH EVERY DAY Tower, Audrie Gallus, MD Taking Active   [facility] LANCETS FINE [place] [id number] Yes 1  each by Other route as directed. CHECK BLOOD GLUCOSE ONCE DAILY AND AS DIRECTED FOR DIABETES MELLITIS Tower, Audrie Gallus, MD Taking Active Self  rosuvastatin (CRESTOR) 10 MG tablet [id number] Yes TAKE 1/2 TABLET BY MOUTH EVERY DAY Tower, Audrie Gallus, MD Taking Active   sodium chloride (MURO 128) 5 % ophthalmic solution [id number] Yes Place 1 drop into both eyes 4 (four) times daily as needed for eye irritation.  [provider] Taking Active Self  Triamcinolone Acetonide (NASACORT ALLERGY 24HR NA) [id number] Yes Place 1 spray into the nose daily as needed (for allergies.). [provider] Taking Active Self           Med Note Katrinka Blazing, Macarthur Critchley   Wed Apr 05, 2019  1:04 PM)    triamcinolone cream (KENALOG) 0.1 % [id number] Yes Apply 1 Application topically 2 (two) times daily. Tower, Audrie Gallus, MD Taking Active   vitamin B-12 (CYANOCOBALAMIN) 1000 MCG tablet [id number] Yes Take 1,000 mcg by mouth at bedtime. [provider] Taking Active Self            Patient Active Problem List   Diagnosis Date Noted   Stress reaction 06/30/2022   Anemia in chronic kidney disease (CKD) 02/04/2022   Current use of proton pump inhibitor 03/21/2021   Macular degeneration, wet (HCC) 09/16/2020   Anemia due to stage 3a chronic kidney disease (HCC) 07/30/2020   Chronic cough 02/27/2020   Dysphagia 02/27/2020   Migraine with aura 02/22/2018   Goals of care, counseling/discussion 10/14/2017   Malignant neoplasm of upper-outer quadrant of left breast in female, estrogen receptor positive (HCC) 08/02/2017   Intertrigo 02/19/2016   Class 2 obesity due to excess calories with body mass index (BMI) of 35.0 to 35.9 in adult 08/02/2015   Encounter for Medicare annual wellness exam 01/09/2014   Chronic kidney disease, stage 3b (HCC) 01/09/2014   Eczema 07/12/2013   Pedal edema 01/06/2013   Allergic rhinitis 08/21/2009   Iron deficiency anemia 07/09/2009   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    History of TV adenoma of colon 06/07/2009   OBSTRUCTIVE SLEEP APNEA 05/31/2009   ANEMIA, B12 DEFICIENCY 05/24/2009   Anxiety and depression 05/15/2009   MEMORY LOSS 04/30/2009   Prediabetes 10/26/2007   LIPOMA, BACK 10/01/2006   Hyperlipidemia 10/01/2006   CARPAL TUNNEL SYNDROME 10/01/2006   Essential hypertension 10/01/2006   IBS 10/01/2006   FIBROCYSTIC BREAST DISEASE 10/01/2006   OSTEOARTHRITIS 10/01/2006   SPINAL STENOSIS 10/01/2006   BACK PAIN, CHRONIC 10/01/2006   MIGRAINES, HX OF 10/01/2006    Immunization History  Administered Date(s) Administered   Fluad Quad(high Dose 65+) 02/28/2019, 02/27/2020   Influenza Whole 02/25/2005, 04/30/2009, 03/17/2010   Influenza,inj,Quad PF,6+ Mos 02/10/2013, 01/09/2014, 02/01/2015, 02/03/2016, 02/10/2017, 03/03/2018, 03/21/2021   PFIZER(Purple Top)SARS-COV-2 Vaccination 11/02/2019, 11/23/2019   Pneumococcal Conjugate-13 01/09/2014   Pneumococcal Polysaccharide-23 04/23/2008   Td 04/13/1995, 10/14/2006     Compliance/Adherence/Medication fill history: Care Gaps: None  Star-Rating Drugs: Losartan - PDC 100%  SDOH:  (Social Determinants of Health) assessments and interventions performed: No - pt has AWV this month Jan 2024, will address then SDOH Interventions    Flowsheet Row Telephone from 07/29/2022 in  [facility] Visit from 06/30/2022 in [facility] Clinical Support from 06/04/2022 in [facility] Clinical Support from 02/10/2017 in [facility] [place]  SDOH Interventions      Food Insecurity Interventions Intervention Not Indicated -- Intervention Not Indicated --  Housing Interventions Intervention Not Indicated -- Intervention Not Indicated --  Transportation Interventions Intervention Not Indicated -- Intervention Not Indicated --  Utilities Interventions -- -- Intervention Not Indicated  --  Depression Interventions/Treatment  -- Medication -- --  [referral to PCP]  Financial Strain Interventions -- -- Intervention Not Indicated --  Stress Interventions -- -- Intervention Not Indicated --  Social Connections Interventions -- -- Intervention Not Indicated --      SDOH Screenings   Food Insecurity: No Food Insecurity (07/29/2022)  Housing: Low Risk  (07/29/2022)  Transportation Needs: No Transportation Needs (07/29/2022)  Utilities: Not At Risk (06/04/2022)  Alcohol Screen: Low Risk  (06/03/2021)  Depression (PHQ2-9): High Risk (08/18/2022)  Financial Resource Strain: Low Risk  (06/04/2022)  Physical Activity: Inactive (06/03/2021)  Social Connections: Moderately Isolated (06/04/2022)  Stress: No Stress Concern Present (06/04/2022)  Tobacco Use: Low Risk  (08/18/2022)    Medication Assistance: None required.  Patient affirms current coverage meets needs.  Medication Access: Within the past 30 days, how often has patient missed a dose of medication? 0 Is a pillbox or other method used to improve adherence? Yes  Factors that may affect medication adherence? no barriers identified Are meds synced by current pharmacy? No  Are meds delivered by current pharmacy? No  Does patient experience delays in picking up medications due to transportation concerns? No  Current Rx insurance plan: BCBS PDP Name and location of Current pharmacy:  [facility] - [place], [place] - [address] [place] [address] Phone: [phone] Fax: [phone]  [id number] - [place], [place] - [address] [place] [address] Phone: [phone] Fax: [phone]   ASSESSMENT / PLAN  Hypertension / CKD stage 3b (BP goal <140/90) -Query Controlled - pt had medications adjusted by nephrology in April -Current home readings: not checking; BP was 130/78 in last nephrology visit 09/08/22 -pt bought new BP monitor 04/2021 (Omron) -Current  treatment: Amlodipine 5 mg daily AM - Appropriate, Query Effective Irbesartan 150 mg daily - Appropriate, Effective, Safe, Accessible Furosemide 20 mg daily AM - Appropriate, Effective, Safe, Accessible Isosorbide MN 30 mg - 1/2 tab daily -Appropriate, Query Effective -Medications previously tried: atenolol, triamterene/htctz, benazepril (cough), metoprolol, olmesartan (kidneys) -Educated on BP goals and benefits of medications for prevention of heart attack, stroke and kidney damage; Importance of home blood pressure monitoring; Proper BP monitoring technique; -Reviewed Avastin eye injections started 05/2020, some studies show even ophthalmic injections can effect BP -Previously we were avoiding diuretics d/t low renal function, it may be reasonable to use thiazide diuretic in future if unable to control BP with other options (diuretic effect is lessened by low GFR, but some studies suggest antihypertensive effect is maintained even at low GFR) -Counseled to monitor BP daily 2 hrs after medications -Recommend to continue current medication; keep nephrology appt next week  Anemia (Goal: HGB > 11) -Uncontrolled - HGB 11.2, ferritin 20, TSAT 13 (07/2022); pt has had iron infusions through hematolog in the past; she has not tolerated oral iron well previosly and took 1 dose of Vitron C this week and had diarrhea -Current treatment  Vitron C - Appropriate, Effective, Safe, Accessible -Medications previously tried: n/a  -Discussed taking oral iron with food can help with GI effects; advised to try 1-2 doses per week; if not tolerated, contact hematology for possible repeat infusion  Hyperlipidemia: (LDL goal < 100) -Controlled - LDL 50 (03/2022) at goal, TRIG slightly elevated at 169; pt endorses compliance with statin and denies side effects -Current treatment: Rosuvastatin 10 mg - 1/2 tab daily HS - Appropriate, Effective, Safe, Accessible -Educated on Cholesterol goals; Benefits of statin for  ASCVD risk reduction -Recommended to continue current medication  IBS / GERD (Goal: manage symptoms) -Stable - pt reports using loperamide before she leaves the house "just in case"; she has seen GI specialist previously and has been told to forego further colonoscopies; she denies issues with GERD -Current treatment  Dicyclomine 20 mg BID -Appropriate, Effective, Safe, Accessible Loperamide 2 mg PRN -Appropriate, Effective, Safe, Accessible Omeprazole 20 mg daily - Appropriate, Effective, Safe, Accessible Probiotic -Appropriate, Effective, Safe, Accessible -Medications previously tried: simethicone -Recommended to continue current medication  Migraine headache (Goal: manage symptoms) -stable - previously pt reports near daily headaches;  she reports these have improved; her migraines have previously affected her motor skills/balance -Current treatment  Cyclobenzaprine 10 mg PRN - Appropriate, Effective, Safe, Accessible Tylenol 500 mg PRN - Appropriate, Effective, Safe, Accessible -Medications previously tried: tramadol, gabapentin, atenolol -Previously Educated on migraine prevention options - beta blockers, amitriptyline, topiramate are first line option; pt has not tolerated atenolol due to low pulse; would avoid amitriptyline due to anticholinergic properties and age; would avoid topiramate due to potential to worsen cognitive issues; CGRP antagonists would be a good option -Can consider CGRP-antagonist (Emgality) for migraine prevention if desired  Health Maintenance -Allergies: coricidin HBP, cetirizine 10 mg - Appropriate, Effective, Safe, Accessible  Al Corpus, PharmD, [employer] Clinical Pharmacist [facility] [phone]

## 2022-11-25 DIAGNOSIS — H35412 Lattice degeneration of retina, left eye: Secondary | ICD-10-CM | POA: Diagnosis not present

## 2022-11-25 DIAGNOSIS — H348121 Central retinal vein occlusion, left eye, with retinal neovascularization: Secondary | ICD-10-CM | POA: Diagnosis not present

## 2022-11-25 DIAGNOSIS — H353211 Exudative age-related macular degeneration, right eye, with active choroidal neovascularization: Secondary | ICD-10-CM | POA: Diagnosis not present

## 2022-11-25 DIAGNOSIS — H43813 Vitreous degeneration, bilateral: Secondary | ICD-10-CM | POA: Diagnosis not present

## 2022-11-25 DIAGNOSIS — H35372 Puckering of macula, left eye: Secondary | ICD-10-CM | POA: Diagnosis not present

## 2022-11-25 DIAGNOSIS — H35033 Hypertensive retinopathy, bilateral: Secondary | ICD-10-CM | POA: Diagnosis not present

## 2022-11-30 ENCOUNTER — Other Ambulatory Visit: Payer: Self-pay | Admitting: Family Medicine

## 2022-11-30 NOTE — Telephone Encounter (Signed)
Medication: Flexeril (Cyclobenzaprine) 10 mg Directions: Take 0.5-1 tablet up to 3 TID as needed for headache and/or muscle spasms   Last given: 09/25/21 Number refills: 1 Last o/v: 08/18/22 Follow up: Not on file  Labs:    Medication: Celexa (Citalopram) 40 mg  Directions: Take 1 tablet at bedtime Last given: 09/01/22 Number refills: 1 Last o/v: 08/18/22 Follow up: Not on file  Labs:

## 2022-12-01 DIAGNOSIS — I129 Hypertensive chronic kidney disease with stage 1 through stage 4 chronic kidney disease, or unspecified chronic kidney disease: Secondary | ICD-10-CM | POA: Diagnosis not present

## 2022-12-01 DIAGNOSIS — I10 Essential (primary) hypertension: Secondary | ICD-10-CM | POA: Diagnosis not present

## 2022-12-01 DIAGNOSIS — R8290 Unspecified abnormal findings in urine: Secondary | ICD-10-CM | POA: Diagnosis not present

## 2022-12-01 DIAGNOSIS — N1832 Chronic kidney disease, stage 3b: Secondary | ICD-10-CM | POA: Diagnosis not present

## 2022-12-08 DIAGNOSIS — N2581 Secondary hyperparathyroidism of renal origin: Secondary | ICD-10-CM | POA: Diagnosis not present

## 2022-12-08 DIAGNOSIS — I129 Hypertensive chronic kidney disease with stage 1 through stage 4 chronic kidney disease, or unspecified chronic kidney disease: Secondary | ICD-10-CM | POA: Diagnosis not present

## 2022-12-08 DIAGNOSIS — R600 Localized edema: Secondary | ICD-10-CM | POA: Diagnosis not present

## 2022-12-08 DIAGNOSIS — R8281 Pyuria: Secondary | ICD-10-CM | POA: Diagnosis not present

## 2022-12-08 DIAGNOSIS — I10 Essential (primary) hypertension: Secondary | ICD-10-CM | POA: Diagnosis not present

## 2022-12-08 DIAGNOSIS — R809 Proteinuria, unspecified: Secondary | ICD-10-CM | POA: Diagnosis not present

## 2022-12-08 DIAGNOSIS — N1832 Chronic kidney disease, stage 3b: Secondary | ICD-10-CM | POA: Diagnosis not present

## 2022-12-14 ENCOUNTER — Other Ambulatory Visit: Payer: Self-pay | Admitting: Family Medicine

## 2022-12-31 ENCOUNTER — Other Ambulatory Visit: Payer: Self-pay | Admitting: Family Medicine

## 2023-01-14 ENCOUNTER — Encounter: Payer: Medicare Other | Admitting: Pharmacist

## 2023-02-04 DIAGNOSIS — H353211 Exudative age-related macular degeneration, right eye, with active choroidal neovascularization: Secondary | ICD-10-CM | POA: Diagnosis not present

## 2023-02-04 DIAGNOSIS — H35412 Lattice degeneration of retina, left eye: Secondary | ICD-10-CM | POA: Diagnosis not present

## 2023-02-04 DIAGNOSIS — H43813 Vitreous degeneration, bilateral: Secondary | ICD-10-CM | POA: Diagnosis not present

## 2023-02-04 DIAGNOSIS — H35033 Hypertensive retinopathy, bilateral: Secondary | ICD-10-CM | POA: Diagnosis not present

## 2023-02-04 DIAGNOSIS — H35372 Puckering of macula, left eye: Secondary | ICD-10-CM | POA: Diagnosis not present

## 2023-02-04 DIAGNOSIS — H34832 Tributary (branch) retinal vein occlusion, left eye, with macular edema: Secondary | ICD-10-CM | POA: Diagnosis not present

## 2023-02-16 DIAGNOSIS — H5203 Hypermetropia, bilateral: Secondary | ICD-10-CM | POA: Diagnosis not present

## 2023-02-16 DIAGNOSIS — H524 Presbyopia: Secondary | ICD-10-CM | POA: Diagnosis not present

## 2023-02-16 DIAGNOSIS — H2513 Age-related nuclear cataract, bilateral: Secondary | ICD-10-CM | POA: Diagnosis not present

## 2023-02-16 DIAGNOSIS — H25043 Posterior subcapsular polar age-related cataract, bilateral: Secondary | ICD-10-CM | POA: Diagnosis not present

## 2023-03-09 DIAGNOSIS — N1832 Chronic kidney disease, stage 3b: Secondary | ICD-10-CM | POA: Diagnosis not present

## 2023-03-09 DIAGNOSIS — I10 Essential (primary) hypertension: Secondary | ICD-10-CM | POA: Diagnosis not present

## 2023-03-09 DIAGNOSIS — R809 Proteinuria, unspecified: Secondary | ICD-10-CM | POA: Diagnosis not present

## 2023-03-09 DIAGNOSIS — I129 Hypertensive chronic kidney disease with stage 1 through stage 4 chronic kidney disease, or unspecified chronic kidney disease: Secondary | ICD-10-CM | POA: Diagnosis not present

## 2023-03-09 DIAGNOSIS — R8281 Pyuria: Secondary | ICD-10-CM | POA: Diagnosis not present

## 2023-03-09 DIAGNOSIS — R600 Localized edema: Secondary | ICD-10-CM | POA: Diagnosis not present

## 2023-03-09 DIAGNOSIS — N2581 Secondary hyperparathyroidism of renal origin: Secondary | ICD-10-CM | POA: Diagnosis not present

## 2023-03-16 DIAGNOSIS — N281 Cyst of kidney, acquired: Secondary | ICD-10-CM | POA: Diagnosis not present

## 2023-03-16 DIAGNOSIS — R809 Proteinuria, unspecified: Secondary | ICD-10-CM | POA: Diagnosis not present

## 2023-03-16 DIAGNOSIS — N2581 Secondary hyperparathyroidism of renal origin: Secondary | ICD-10-CM | POA: Diagnosis not present

## 2023-03-16 DIAGNOSIS — I10 Essential (primary) hypertension: Secondary | ICD-10-CM | POA: Diagnosis not present

## 2023-03-16 DIAGNOSIS — I129 Hypertensive chronic kidney disease with stage 1 through stage 4 chronic kidney disease, or unspecified chronic kidney disease: Secondary | ICD-10-CM | POA: Diagnosis not present

## 2023-03-16 DIAGNOSIS — R600 Localized edema: Secondary | ICD-10-CM | POA: Diagnosis not present

## 2023-03-16 DIAGNOSIS — N1832 Chronic kidney disease, stage 3b: Secondary | ICD-10-CM | POA: Diagnosis not present

## 2023-04-05 ENCOUNTER — Other Ambulatory Visit: Payer: Self-pay | Admitting: Family Medicine

## 2023-04-05 NOTE — Telephone Encounter (Signed)
Last appointment 08/18/22 f/u HTN  Next appointment n/a  refill 11/30/22 #90 no rf

## 2023-04-22 DIAGNOSIS — H353211 Exudative age-related macular degeneration, right eye, with active choroidal neovascularization: Secondary | ICD-10-CM | POA: Diagnosis not present

## 2023-04-22 DIAGNOSIS — H35412 Lattice degeneration of retina, left eye: Secondary | ICD-10-CM | POA: Diagnosis not present

## 2023-04-22 DIAGNOSIS — H34832 Tributary (branch) retinal vein occlusion, left eye, with macular edema: Secondary | ICD-10-CM | POA: Diagnosis not present

## 2023-04-22 DIAGNOSIS — H35372 Puckering of macula, left eye: Secondary | ICD-10-CM | POA: Diagnosis not present

## 2023-04-22 DIAGNOSIS — H43813 Vitreous degeneration, bilateral: Secondary | ICD-10-CM | POA: Diagnosis not present

## 2023-04-22 DIAGNOSIS — H35033 Hypertensive retinopathy, bilateral: Secondary | ICD-10-CM | POA: Diagnosis not present

## 2023-04-28 ENCOUNTER — Other Ambulatory Visit: Payer: Self-pay | Admitting: Family Medicine

## 2023-04-28 NOTE — Telephone Encounter (Signed)
Pt hasn't had a lipid lab in over a year. Please advise

## 2023-04-28 NOTE — Telephone Encounter (Signed)
Please schedule a fasting lab for lipid and ast and alt for hyperlipidemia Refill to get by until that returns  Thanks

## 2023-04-29 NOTE — Telephone Encounter (Signed)
Patient has been scheduled

## 2023-04-29 NOTE — Telephone Encounter (Signed)
Patient will call back to schedule after she speaks with her daughter.

## 2023-04-29 NOTE — Telephone Encounter (Signed)
See Dr. Royden Purl prev comments.   Please schedule appt then route back to me to refill. Thanks

## 2023-05-02 ENCOUNTER — Telehealth: Payer: Self-pay | Admitting: Family Medicine

## 2023-05-02 DIAGNOSIS — D518 Other vitamin B12 deficiency anemias: Secondary | ICD-10-CM

## 2023-05-02 DIAGNOSIS — Z79899 Other long term (current) drug therapy: Secondary | ICD-10-CM

## 2023-05-02 DIAGNOSIS — R7303 Prediabetes: Secondary | ICD-10-CM

## 2023-05-02 DIAGNOSIS — E7800 Pure hypercholesterolemia, unspecified: Secondary | ICD-10-CM

## 2023-05-02 DIAGNOSIS — I10 Essential (primary) hypertension: Secondary | ICD-10-CM

## 2023-05-02 NOTE — Telephone Encounter (Signed)
-----   Message from Lovena Neighbours sent at 04/29/2023  2:01 PM EST ----- Regarding: Lab orders for Tuesday 12.17.24 Please put lab orders in future. Thank you, Denny Peon

## 2023-05-04 ENCOUNTER — Other Ambulatory Visit (INDEPENDENT_AMBULATORY_CARE_PROVIDER_SITE_OTHER): Payer: Medicare Other

## 2023-05-04 DIAGNOSIS — E7800 Pure hypercholesterolemia, unspecified: Secondary | ICD-10-CM | POA: Diagnosis not present

## 2023-05-04 DIAGNOSIS — R7303 Prediabetes: Secondary | ICD-10-CM | POA: Diagnosis not present

## 2023-05-04 LAB — LIPID PANEL
Cholesterol: 152 mg/dL (ref 0–200)
HDL: 50.7 mg/dL (ref >39.00)
LDL Cholesterol: 69 mg/dL (ref 0–99)
NonHDL: 101.43
Total CHOL/HDL Ratio: 3
Triglycerides: 164 mg/dL — ABNORMAL HIGH (ref 0.0–149.0)
VLDL: 32.8 mg/dL (ref 0.0–40.0)

## 2023-05-04 LAB — HEMOGLOBIN A1C: Hgb A1c MFr Bld: 6.5 % (ref 4.6–6.5)

## 2023-05-04 LAB — ALT: ALT: 11 U/L (ref 0–35)

## 2023-05-04 LAB — AST: AST: 15 U/L (ref 0–37)

## 2023-05-13 ENCOUNTER — Other Ambulatory Visit: Payer: Self-pay | Admitting: Family Medicine

## 2023-05-14 NOTE — Telephone Encounter (Signed)
Bentyl last filled on 08/05/22 #180 tabs/ 1 refill  Kenalog last filled on 04/14/22 # 30 g with 3 refills  Last OV was a f/u on 08/18/22

## 2023-05-17 ENCOUNTER — Telehealth: Payer: Self-pay

## 2023-05-17 ENCOUNTER — Encounter: Payer: Self-pay | Admitting: Oncology

## 2023-05-17 ENCOUNTER — Other Ambulatory Visit (HOSPITAL_COMMUNITY): Payer: Self-pay

## 2023-05-17 NOTE — Telephone Encounter (Signed)
Pharmacy Patient Advocate Encounter   Received notification from CoverMyMeds that prior authorization for Dicyclomine is required/requested.   Insurance verification completed.   The patient is insured through [facility] .   Per test claim: PA required; PA submitted to above mentioned insurance via CoverMyMeds Key/confirmation #/EOC Key: [facility]   Status is pending

## 2023-05-18 NOTE — Telephone Encounter (Signed)
PA approved for Dicyclomine from 05/18/23-05/17/24

## 2023-05-31 DIAGNOSIS — H34832 Tributary (branch) retinal vein occlusion, left eye, with macular edema: Secondary | ICD-10-CM | POA: Diagnosis not present

## 2023-05-31 DIAGNOSIS — H25813 Combined forms of age-related cataract, bilateral: Secondary | ICD-10-CM | POA: Diagnosis not present

## 2023-05-31 DIAGNOSIS — H35412 Lattice degeneration of retina, left eye: Secondary | ICD-10-CM | POA: Diagnosis not present

## 2023-05-31 DIAGNOSIS — H35372 Puckering of macula, left eye: Secondary | ICD-10-CM | POA: Diagnosis not present

## 2023-05-31 DIAGNOSIS — H43813 Vitreous degeneration, bilateral: Secondary | ICD-10-CM | POA: Diagnosis not present

## 2023-05-31 DIAGNOSIS — H353211 Exudative age-related macular degeneration, right eye, with active choroidal neovascularization: Secondary | ICD-10-CM | POA: Diagnosis not present

## 2023-05-31 DIAGNOSIS — H35033 Hypertensive retinopathy, bilateral: Secondary | ICD-10-CM | POA: Diagnosis not present

## 2023-06-07 ENCOUNTER — Ambulatory Visit (INDEPENDENT_AMBULATORY_CARE_PROVIDER_SITE_OTHER): Payer: Medicare Other

## 2023-06-07 VITALS — Ht 60.35 in | Wt 200.0 lb

## 2023-06-07 DIAGNOSIS — Z0000 Encounter for general adult medical examination without abnormal findings: Secondary | ICD-10-CM

## 2023-06-07 NOTE — Patient Instructions (Signed)
Ann Cummings , Thank you for taking time to come for your Medicare Wellness Visit. I appreciate your ongoing commitment to your health goals. Please review the following plan we discussed and let me know if I can assist you in the future.   Referrals/Orders/Follow-Ups/Clinician Recommendations: none  This is a list of the screening recommended for you and due dates:  Health Maintenance  Topic Date Due   Zoster (Shingles) Vaccine (1 of 2) Never done   DTaP/Tdap/Td vaccine (3 - Tdap) 10/13/2016   COVID-19 Vaccine (3 - Pfizer risk series) 12/21/2019   Flu Shot  12/17/2022   Yearly kidney health urinalysis for diabetes  04/15/2027*   Mammogram  07/31/2023   Yearly kidney function blood test for diabetes  08/05/2023   Hemoglobin A1C  11/02/2023   Medicare Annual Wellness Visit  06/06/2024   Pneumonia Vaccine  Completed   DEXA scan (bone density measurement)  Completed   HPV Vaccine  Aged Out   Complete foot exam   Discontinued   Eye exam for diabetics  Discontinued   Colon Cancer Screening  Discontinued  *Topic was postponed. The date shown is not the original due date.    Advanced directives: (Copy Requested) Please bring a copy of your health care power of attorney and living will to the office to be added to your chart at your convenience.  Next Medicare Annual Wellness Visit scheduled for next year: Yes 06/07/2024 @ 8:50am televisit
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
## 2023-06-08 ENCOUNTER — Other Ambulatory Visit: Payer: Self-pay | Admitting: Family Medicine

## 2023-06-10 DIAGNOSIS — H34832 Tributary (branch) retinal vein occlusion, left eye, with macular edema: Secondary | ICD-10-CM | POA: Diagnosis not present

## 2023-06-10 DIAGNOSIS — H353211 Exudative age-related macular degeneration, right eye, with active choroidal neovascularization: Secondary | ICD-10-CM | POA: Diagnosis not present

## 2023-06-11 ENCOUNTER — Other Ambulatory Visit: Payer: Self-pay | Admitting: Family Medicine

## 2023-06-28 DIAGNOSIS — H04123 Dry eye syndrome of bilateral lacrimal glands: Secondary | ICD-10-CM | POA: Diagnosis not present

## 2023-06-28 DIAGNOSIS — H353231 Exudative age-related macular degeneration, bilateral, with active choroidal neovascularization: Secondary | ICD-10-CM | POA: Diagnosis not present

## 2023-06-28 DIAGNOSIS — H2513 Age-related nuclear cataract, bilateral: Secondary | ICD-10-CM | POA: Diagnosis not present

## 2023-06-28 DIAGNOSIS — H2511 Age-related nuclear cataract, right eye: Secondary | ICD-10-CM | POA: Diagnosis not present

## 2023-06-28 DIAGNOSIS — H18413 Arcus senilis, bilateral: Secondary | ICD-10-CM | POA: Diagnosis not present

## 2023-07-05 ENCOUNTER — Other Ambulatory Visit: Payer: Self-pay | Admitting: Family Medicine

## 2023-07-05 NOTE — Telephone Encounter (Signed)
Please schedule annual exam with me April or later

## 2023-07-05 NOTE — Telephone Encounter (Signed)
Called pt and pt stated that she will call and schedule  physical when her daughter gets there

## 2023-07-05 NOTE — Telephone Encounter (Signed)
Looks like patient has not had this addressed in a while do you need to see in office?

## 2023-07-20 DIAGNOSIS — R600 Localized edema: Secondary | ICD-10-CM | POA: Diagnosis not present

## 2023-07-20 DIAGNOSIS — N2581 Secondary hyperparathyroidism of renal origin: Secondary | ICD-10-CM | POA: Diagnosis not present

## 2023-07-20 DIAGNOSIS — N1832 Chronic kidney disease, stage 3b: Secondary | ICD-10-CM | POA: Diagnosis not present

## 2023-07-20 DIAGNOSIS — I129 Hypertensive chronic kidney disease with stage 1 through stage 4 chronic kidney disease, or unspecified chronic kidney disease: Secondary | ICD-10-CM | POA: Diagnosis not present

## 2023-07-20 DIAGNOSIS — I10 Essential (primary) hypertension: Secondary | ICD-10-CM | POA: Diagnosis not present

## 2023-07-20 DIAGNOSIS — N281 Cyst of kidney, acquired: Secondary | ICD-10-CM | POA: Diagnosis not present

## 2023-07-20 DIAGNOSIS — R809 Proteinuria, unspecified: Secondary | ICD-10-CM | POA: Diagnosis not present

## 2023-07-27 ENCOUNTER — Other Ambulatory Visit: Payer: Self-pay | Admitting: Family Medicine

## 2023-07-29 DIAGNOSIS — H35372 Puckering of macula, left eye: Secondary | ICD-10-CM | POA: Diagnosis not present

## 2023-07-29 DIAGNOSIS — H34832 Tributary (branch) retinal vein occlusion, left eye, with macular edema: Secondary | ICD-10-CM | POA: Diagnosis not present

## 2023-07-29 DIAGNOSIS — H353211 Exudative age-related macular degeneration, right eye, with active choroidal neovascularization: Secondary | ICD-10-CM | POA: Diagnosis not present

## 2023-07-29 DIAGNOSIS — H43813 Vitreous degeneration, bilateral: Secondary | ICD-10-CM | POA: Diagnosis not present

## 2023-07-29 DIAGNOSIS — H25813 Combined forms of age-related cataract, bilateral: Secondary | ICD-10-CM | POA: Diagnosis not present

## 2023-07-29 DIAGNOSIS — H35033 Hypertensive retinopathy, bilateral: Secondary | ICD-10-CM | POA: Diagnosis not present

## 2023-07-29 DIAGNOSIS — H35412 Lattice degeneration of retina, left eye: Secondary | ICD-10-CM | POA: Diagnosis not present

## 2023-08-04 ENCOUNTER — Other Ambulatory Visit: Payer: Self-pay | Admitting: Family Medicine

## 2023-08-04 NOTE — Telephone Encounter (Signed)
 Last filled on 04/05/23 #90 tabs/ 0 refills   CPE scheduled on 09/07/23

## 2023-08-09 DIAGNOSIS — R600 Localized edema: Secondary | ICD-10-CM | POA: Diagnosis not present

## 2023-08-09 DIAGNOSIS — I129 Hypertensive chronic kidney disease with stage 1 through stage 4 chronic kidney disease, or unspecified chronic kidney disease: Secondary | ICD-10-CM | POA: Diagnosis not present

## 2023-08-09 DIAGNOSIS — N1832 Chronic kidney disease, stage 3b: Secondary | ICD-10-CM | POA: Diagnosis not present

## 2023-08-09 DIAGNOSIS — N281 Cyst of kidney, acquired: Secondary | ICD-10-CM | POA: Diagnosis not present

## 2023-08-09 DIAGNOSIS — N2581 Secondary hyperparathyroidism of renal origin: Secondary | ICD-10-CM | POA: Diagnosis not present

## 2023-08-09 DIAGNOSIS — I10 Essential (primary) hypertension: Secondary | ICD-10-CM | POA: Diagnosis not present

## 2023-08-09 DIAGNOSIS — R809 Proteinuria, unspecified: Secondary | ICD-10-CM | POA: Diagnosis not present

## 2023-08-31 ENCOUNTER — Other Ambulatory Visit: Payer: Self-pay | Admitting: Family Medicine

## 2023-09-07 ENCOUNTER — Encounter: Payer: Self-pay | Admitting: Family Medicine

## 2023-09-07 ENCOUNTER — Ambulatory Visit: Payer: Medicare Other | Admitting: Family Medicine

## 2023-09-07 VITALS — BP 144/68 | HR 83 | Temp 98.7°F | Ht 63.0 in | Wt 200.4 lb

## 2023-09-07 DIAGNOSIS — R7303 Prediabetes: Secondary | ICD-10-CM | POA: Diagnosis not present

## 2023-09-07 DIAGNOSIS — D518 Other vitamin B12 deficiency anemias: Secondary | ICD-10-CM

## 2023-09-07 DIAGNOSIS — R600 Localized edema: Secondary | ICD-10-CM | POA: Diagnosis not present

## 2023-09-07 DIAGNOSIS — E559 Vitamin D deficiency, unspecified: Secondary | ICD-10-CM | POA: Diagnosis not present

## 2023-09-07 DIAGNOSIS — D508 Other iron deficiency anemias: Secondary | ICD-10-CM | POA: Diagnosis not present

## 2023-09-07 DIAGNOSIS — F419 Anxiety disorder, unspecified: Secondary | ICD-10-CM | POA: Diagnosis not present

## 2023-09-07 DIAGNOSIS — N1831 Chronic kidney disease, stage 3a: Secondary | ICD-10-CM

## 2023-09-07 DIAGNOSIS — Z79899 Other long term (current) drug therapy: Secondary | ICD-10-CM | POA: Diagnosis not present

## 2023-09-07 DIAGNOSIS — E66812 Obesity, class 2: Secondary | ICD-10-CM | POA: Diagnosis not present

## 2023-09-07 DIAGNOSIS — Z1231 Encounter for screening mammogram for malignant neoplasm of breast: Secondary | ICD-10-CM | POA: Insufficient documentation

## 2023-09-07 DIAGNOSIS — F32A Depression, unspecified: Secondary | ICD-10-CM

## 2023-09-07 DIAGNOSIS — E7800 Pure hypercholesterolemia, unspecified: Secondary | ICD-10-CM | POA: Diagnosis not present

## 2023-09-07 DIAGNOSIS — E2839 Other primary ovarian failure: Secondary | ICD-10-CM | POA: Insufficient documentation

## 2023-09-07 DIAGNOSIS — N1832 Chronic kidney disease, stage 3b: Secondary | ICD-10-CM | POA: Diagnosis not present

## 2023-09-07 DIAGNOSIS — I10 Essential (primary) hypertension: Secondary | ICD-10-CM

## 2023-09-07 DIAGNOSIS — H353230 Exudative age-related macular degeneration, bilateral, stage unspecified: Secondary | ICD-10-CM

## 2023-09-07 DIAGNOSIS — D631 Anemia in chronic kidney disease: Secondary | ICD-10-CM

## 2023-09-07 DIAGNOSIS — Z6835 Body mass index (BMI) 35.0-35.9, adult: Secondary | ICD-10-CM

## 2023-09-07 LAB — VITAMIN B12: Vitamin B-12: 642 pg/mL (ref 211–911)

## 2023-09-07 LAB — CBC WITH DIFFERENTIAL/PLATELET
Basophils Absolute: 0 10*3/uL (ref 0.0–0.1)
Basophils Relative: 0.3 % (ref 0.0–3.0)
Eosinophils Absolute: 0.2 10*3/uL (ref 0.0–0.7)
Eosinophils Relative: 2.3 % (ref 0.0–5.0)
HCT: 30 % — ABNORMAL LOW (ref 36.0–46.0)
Hemoglobin: 9.9 g/dL — ABNORMAL LOW (ref 12.0–15.0)
Lymphocytes Relative: 19.2 % (ref 12.0–46.0)
Lymphs Abs: 1.6 10*3/uL (ref 0.7–4.0)
MCHC: 33 g/dL (ref 30.0–36.0)
MCV: 90.3 fl (ref 78.0–100.0)
Monocytes Absolute: 0.7 10*3/uL (ref 0.1–1.0)
Monocytes Relative: 8.2 % (ref 3.0–12.0)
Neutro Abs: 6 10*3/uL (ref 1.4–7.7)
Neutrophils Relative %: 70 % (ref 43.0–77.0)
Platelets: 307 10*3/uL (ref 150.0–400.0)
RBC: 3.32 Mil/uL — ABNORMAL LOW (ref 3.87–5.11)
RDW: 13.7 % (ref 11.5–15.5)
WBC: 8.6 10*3/uL (ref 4.0–10.5)

## 2023-09-07 LAB — LIPID PANEL
Cholesterol: 143 mg/dL (ref 0–200)
HDL: 41.2 mg/dL (ref >39.00)
LDL Cholesterol: 53 mg/dL (ref 0–99)
NonHDL: 101.48
Total CHOL/HDL Ratio: 3
Triglycerides: 240 mg/dL — ABNORMAL HIGH (ref 0.0–149.0)
VLDL: 48 mg/dL — ABNORMAL HIGH (ref 0.0–40.0)

## 2023-09-07 LAB — COMPREHENSIVE METABOLIC PANEL WITH GFR
ALT: 6 U/L (ref 0–35)
AST: 11 U/L (ref 0–37)
Albumin: 3.8 g/dL (ref 3.5–5.2)
Alkaline Phosphatase: 39 U/L (ref 39–117)
BUN: 33 mg/dL — ABNORMAL HIGH (ref 6–23)
CO2: 27 meq/L (ref 19–32)
Calcium: 8.8 mg/dL (ref 8.4–10.5)
Chloride: 104 meq/L (ref 96–112)
Creatinine, Ser: 1.94 mg/dL — ABNORMAL HIGH (ref 0.40–1.20)
GFR: 23.9 mL/min — ABNORMAL LOW (ref >60.00)
Glucose, Bld: 84 mg/dL (ref 70–99)
Potassium: 4.3 meq/L (ref 3.5–5.1)
Sodium: 139 meq/L (ref 135–145)
Total Bilirubin: 0.3 mg/dL (ref 0.2–1.2)
Total Protein: 6 g/dL (ref 6.0–8.3)

## 2023-09-07 LAB — VITAMIN D 25 HYDROXY (VIT D DEFICIENCY, FRACTURES): VITD: 17.5 ng/mL — ABNORMAL LOW (ref 30.00–100.00)

## 2023-09-07 LAB — TSH: TSH: 1.7 u[IU]/mL (ref 0.35–5.50)

## 2023-09-07 LAB — HEMOGLOBIN A1C: Hgb A1c MFr Bld: 6.6 % — ABNORMAL HIGH (ref 4.6–6.5)

## 2023-09-07 NOTE — Assessment & Plan Note (Signed)
 B12 level today  Continues omeprazole  20 mg  Unable to tolerate holding

## 2023-09-07 NOTE — Assessment & Plan Note (Signed)
 B12 level today  Last level was high

## 2023-09-07 NOTE — Patient Instructions (Addendum)
 Try to fit in some exercise  Add some strength training to your routine, this is important for bone and brain health and can reduce your risk of falls and help your body use insulin properly and regulate weight  Light weights, exercise bands , and internet videos are a good way to start  Yoga (chair or regular), machines , floor exercises or a gym with machines are also good options     Try to get most of your carbohydrates from produce (with the exception of white potatoes) and whole grains Eat less bread/pasta/rice/snack foods/cereals/sweets and other items from the middle of the grocery store (processed carbs)  Avoid red meat/ fried foods/ egg yolks/ fatty breakfast meats/ butter, cheese and high fat dairy/ and shellfish   Labs today    You have an order for:  []   2D Mammogram  [x]   3D Mammogram  [x]   Bone Density     Please call for appointment:   [x]   [facility]  [address] [place] [address]  [phone]  []   [facility])   [address]  [place], [place] [address]  [phone]  []   [facility]       [address] [place], [place]        [phone]         []   [facility]  [address] [place], [place]  [phone]  []  [facility]   [address]   [place], [place] [address]  [phone]  []  [facility]  [address] [place], [place] [address] [phone]    Make sure to wear two piece clothing  No lotions powders or deodorants the day of the appointment Make sure to bring picture ID and insurance card.  Bring list of medications you are currently taking including any supplements.   Schedule your screening mammogram through MyChart!   [facility] imaging sites can now be scheduled  through MyChart.  Log into your MyChart account.  Go to 'Visit' (or 'Appointments' if  on mobile App) --> Schedule an  Appointment  Under 'Select a Reason for Visit' choose the Mammogram  Screening option.  Complete the pre-visit questions  and select the time and place that  best fits your schedule

## 2023-09-07 NOTE — Assessment & Plan Note (Signed)
 Not at goal BP: (!) 144/68  Continues nephrology care Encouraged better lifestyle choices  Lasix  40 mg daily  Amlodipine  5 mg daily  Isosorbide  15 mg daily  Ibesartan 150 mg daily

## 2023-09-07 NOTE — Assessment & Plan Note (Signed)
 A1c today  disc imp of low glycemic diet and wt loss to prevent DM2   Not motivated to do this
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
## 2023-09-07 NOTE — Assessment & Plan Note (Signed)
 D level today

## 2023-09-07 NOTE — Assessment & Plan Note (Signed)
 Relatively stable  Continues celexa  40 mg daily  Declines counseling  Encouraged more exercise and better self care

## 2023-09-07 NOTE — Assessment & Plan Note (Signed)
 Disc goals for lipids and reasons to control them Rev last labs with pt Rev low sat fat diet in detail  Lab today Continues crestor  10 mg daily  Did eat sausage this am so trig may be elevated

## 2023-09-07 NOTE — Assessment & Plan Note (Signed)
 Continues oph care

## 2023-09-07 NOTE — Assessment & Plan Note (Signed)
 Lab today  Under care of nephrology

## 2023-09-07 NOTE — Assessment & Plan Note (Signed)
 Dexa ordered

## 2023-09-07 NOTE — Assessment & Plan Note (Signed)
 Discussed how this problem influences overall health and the risks it imposes  Reviewed plan for weight loss with lower calorie diet (via better food choices (lower glycemic and portion control) along with exercise building up to or more than 30 minutes 5 days per week including some aerobic activity and strength training

## 2023-09-07 NOTE — Assessment & Plan Note (Signed)
 Ongoing Lasix  40 mg daily  Under nephrology care  Was previously encouraged to use lymphedema pumps

## 2023-09-07 NOTE — Assessment & Plan Note (Signed)
 Cbc today  Under care of heme and nephrology

## 2023-09-07 NOTE — Assessment & Plan Note (Addendum)
 With history of iron  def and also low B12 in past  Monitored by nephrology and hematology  Labs today

## 2023-09-07 NOTE — Assessment & Plan Note (Signed)
 Mammogram ordered  Personal history of breast cancer on left in past

## 2023-09-16 DIAGNOSIS — H353211 Exudative age-related macular degeneration, right eye, with active choroidal neovascularization: Secondary | ICD-10-CM | POA: Diagnosis not present

## 2023-09-16 DIAGNOSIS — H34832 Tributary (branch) retinal vein occlusion, left eye, with macular edema: Secondary | ICD-10-CM | POA: Diagnosis not present

## 2023-09-23 DIAGNOSIS — H2511 Age-related nuclear cataract, right eye: Secondary | ICD-10-CM | POA: Diagnosis not present

## 2023-09-23 DIAGNOSIS — Z961 Presence of intraocular lens: Secondary | ICD-10-CM | POA: Diagnosis not present

## 2023-09-24 DIAGNOSIS — H25012 Cortical age-related cataract, left eye: Secondary | ICD-10-CM | POA: Diagnosis not present

## 2023-09-24 DIAGNOSIS — H2512 Age-related nuclear cataract, left eye: Secondary | ICD-10-CM | POA: Diagnosis not present

## 2023-09-24 DIAGNOSIS — H25042 Posterior subcapsular polar age-related cataract, left eye: Secondary | ICD-10-CM | POA: Diagnosis not present

## 2023-10-04 ENCOUNTER — Other Ambulatory Visit: Payer: Self-pay | Admitting: Family Medicine

## 2023-10-14 DIAGNOSIS — Z961 Presence of intraocular lens: Secondary | ICD-10-CM | POA: Diagnosis not present

## 2023-10-14 DIAGNOSIS — H2512 Age-related nuclear cataract, left eye: Secondary | ICD-10-CM | POA: Diagnosis not present

## 2023-10-14 DIAGNOSIS — H2511 Age-related nuclear cataract, right eye: Secondary | ICD-10-CM | POA: Diagnosis not present

## 2023-10-19 ENCOUNTER — Ambulatory Visit: Admitting: Family Medicine

## 2023-10-22 ENCOUNTER — Other Ambulatory Visit: Payer: Self-pay | Admitting: Family Medicine

## 2023-11-01 ENCOUNTER — Encounter: Payer: Self-pay | Admitting: Family Medicine

## 2023-11-01 ENCOUNTER — Ambulatory Visit (INDEPENDENT_AMBULATORY_CARE_PROVIDER_SITE_OTHER): Admitting: Family Medicine

## 2023-11-01 VITALS — BP 138/60 | HR 88 | Temp 99.8°F | Ht 63.0 in | Wt 199.5 lb

## 2023-11-01 DIAGNOSIS — N390 Urinary tract infection, site not specified: Secondary | ICD-10-CM

## 2023-11-01 DIAGNOSIS — E66812 Obesity, class 2: Secondary | ICD-10-CM

## 2023-11-01 DIAGNOSIS — Z6835 Body mass index (BMI) 35.0-35.9, adult: Secondary | ICD-10-CM | POA: Diagnosis not present

## 2023-11-01 DIAGNOSIS — R7303 Prediabetes: Secondary | ICD-10-CM | POA: Diagnosis not present

## 2023-11-01 DIAGNOSIS — I10 Essential (primary) hypertension: Secondary | ICD-10-CM | POA: Diagnosis not present

## 2023-11-01 DIAGNOSIS — R410 Disorientation, unspecified: Secondary | ICD-10-CM | POA: Diagnosis not present

## 2023-11-01 DIAGNOSIS — N1832 Chronic kidney disease, stage 3b: Secondary | ICD-10-CM | POA: Diagnosis not present

## 2023-11-01 LAB — POC URINALSYSI DIPSTICK (AUTOMATED)
Bilirubin, UA: NEGATIVE
Blood, UA: NEGATIVE
Glucose, UA: NEGATIVE
Ketones, UA: NEGATIVE
Nitrite, UA: NEGATIVE
Protein, UA: POSITIVE — AB
Spec Grav, UA: 1.015 (ref 1.010–1.025)
Urobilinogen, UA: 0.2 U/dL
pH, UA: 6 (ref 5.0–8.0)

## 2023-11-01 MED ORDER — CEPHALEXIN 500 MG PO CAPS: 500.0000 mg | ORAL_CAPSULE | Freq: Two times a day (BID) | ORAL | 0 refills | Status: AC

## 2023-11-01 NOTE — Assessment & Plan Note (Signed)
 Continues nephrology care Visit next mo  Last GFR down to 23.9 and hb 9.9   Encouraged good fluid intake Likely uti- culture pending   Blood pressure is in fair control

## 2023-11-01 NOTE — Assessment & Plan Note (Addendum)
 Today /more than usual  Suspect uti -will treat/culture pending

## 2023-11-01 NOTE — Patient Instructions (Addendum)
 Keep drinking water   Eat a low glycemic diet   Try to get most of your carbohydrates from produce (with the exception of white potatoes) and whole grains Eat less bread/pasta/rice/snack foods/cereals/sweets and other items from the middle of the grocery store (processed carbs)  Also lean protein   You may have a uti  Take keflex as directed (if any side effect like rash or swelling stop it and let us  kno)   We will get a urine culture and reach out with results   If symptoms worsen let us  know   Keep appointment with Dr Zelda Hickman next month     Follow up here after July 22 for visit and A1c

## 2023-11-01 NOTE — Assessment & Plan Note (Signed)
 bp in fair control at this time  BP Readings from Last 1 Encounters:  11/01/23 138/60   No changes needed Most recent labs reviewed  Disc lifstyle change with low sodium diet and exercise  As usual better on 2nd check Continues Amlodipine  5 mg daily  Isosorbide  15 mg daily  Ibesartan 150 mg daily

## 2023-11-01 NOTE — Assessment & Plan Note (Signed)
 With symptom of confusion More urinary incontinence Urinalysis with wbc Pending culture Keflex prescription (is all to amp but has had iv cephalosporin in past) Will call if any side effects or rash  In setting of CKD

## 2023-11-01 NOTE — Assessment & Plan Note (Signed)
 Weight loss encouraged for better glucose control

## 2023-11-01 NOTE — Assessment & Plan Note (Signed)
 A1c up last time Lab Results  Component Value Date   HGBA1C 6.6 (H) 09/07/2023   HGBA1C 6.5 05/04/2023   HGBA1C 6.2 04/07/2022   Into mild dm range Discussed low glycemic diet in detail Not candidate for metformin due to renal function  Or jardiance due to utis  Will work on diet and follow up in July for re check
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
## 2023-11-02 LAB — URINE CULTURE
MICRO NUMBER:: 16584511
Result:: NO GROWTH
SPECIMEN QUALITY:: ADEQUATE

## 2023-11-03 ENCOUNTER — Ambulatory Visit: Payer: Self-pay | Admitting: Family Medicine

## 2023-11-11 DIAGNOSIS — H35412 Lattice degeneration of retina, left eye: Secondary | ICD-10-CM | POA: Diagnosis not present

## 2023-11-11 DIAGNOSIS — H34832 Tributary (branch) retinal vein occlusion, left eye, with macular edema: Secondary | ICD-10-CM | POA: Diagnosis not present

## 2023-11-11 DIAGNOSIS — H353211 Exudative age-related macular degeneration, right eye, with active choroidal neovascularization: Secondary | ICD-10-CM | POA: Diagnosis not present

## 2023-11-11 DIAGNOSIS — H35033 Hypertensive retinopathy, bilateral: Secondary | ICD-10-CM | POA: Diagnosis not present

## 2023-11-11 DIAGNOSIS — H43813 Vitreous degeneration, bilateral: Secondary | ICD-10-CM | POA: Diagnosis not present

## 2023-11-11 DIAGNOSIS — H35372 Puckering of macula, left eye: Secondary | ICD-10-CM | POA: Diagnosis not present

## 2023-11-26 ENCOUNTER — Other Ambulatory Visit: Payer: Self-pay | Admitting: Family Medicine

## 2023-11-26 NOTE — Telephone Encounter (Signed)
 Bentyl  last filled on 05/14/23 #180 tab/ 1 refills   Imdur  also filled on 08/31/23 #45 tab/ 0 refills   F/u scheduled on 12/08/23

## 2023-12-07 DIAGNOSIS — N1832 Chronic kidney disease, stage 3b: Secondary | ICD-10-CM | POA: Diagnosis not present

## 2023-12-07 DIAGNOSIS — R809 Proteinuria, unspecified: Secondary | ICD-10-CM | POA: Diagnosis not present

## 2023-12-07 DIAGNOSIS — N281 Cyst of kidney, acquired: Secondary | ICD-10-CM | POA: Diagnosis not present

## 2023-12-07 DIAGNOSIS — R600 Localized edema: Secondary | ICD-10-CM | POA: Diagnosis not present

## 2023-12-07 DIAGNOSIS — N2581 Secondary hyperparathyroidism of renal origin: Secondary | ICD-10-CM | POA: Diagnosis not present

## 2023-12-07 DIAGNOSIS — I10 Essential (primary) hypertension: Secondary | ICD-10-CM | POA: Diagnosis not present

## 2023-12-07 DIAGNOSIS — I129 Hypertensive chronic kidney disease with stage 1 through stage 4 chronic kidney disease, or unspecified chronic kidney disease: Secondary | ICD-10-CM | POA: Diagnosis not present

## 2023-12-08 ENCOUNTER — Ambulatory Visit: Admitting: Family Medicine

## 2023-12-13 ENCOUNTER — Telehealth: Payer: Self-pay

## 2023-12-13 DIAGNOSIS — N2581 Secondary hyperparathyroidism of renal origin: Secondary | ICD-10-CM | POA: Diagnosis not present

## 2023-12-13 DIAGNOSIS — N184 Chronic kidney disease, stage 4 (severe): Secondary | ICD-10-CM | POA: Diagnosis not present

## 2023-12-13 DIAGNOSIS — R600 Localized edema: Secondary | ICD-10-CM | POA: Diagnosis not present

## 2023-12-13 DIAGNOSIS — N281 Cyst of kidney, acquired: Secondary | ICD-10-CM | POA: Diagnosis not present

## 2023-12-13 DIAGNOSIS — I129 Hypertensive chronic kidney disease with stage 1 through stage 4 chronic kidney disease, or unspecified chronic kidney disease: Secondary | ICD-10-CM | POA: Diagnosis not present

## 2023-12-13 DIAGNOSIS — D631 Anemia in chronic kidney disease: Secondary | ICD-10-CM | POA: Diagnosis not present

## 2023-12-13 DIAGNOSIS — R809 Proteinuria, unspecified: Secondary | ICD-10-CM | POA: Diagnosis not present

## 2023-12-13 DIAGNOSIS — I10 Essential (primary) hypertension: Secondary | ICD-10-CM | POA: Diagnosis not present

## 2023-12-13 NOTE — Telephone Encounter (Signed)
 Nephrology wants pt to get IV iron .   Please schedule pt and notify her of appt details:   Labs in 1-2 weeks  MD/ +/- venofer  1-2 days after labs

## 2023-12-13 NOTE — Telephone Encounter (Signed)
-----   Message from Zelphia Cap sent at 12/13/2023 11:55 AM EDT ----- Nephrology wants her to get IV iron .  Please arrange her to get labs -cbc iron  tibc ferritin retic panel prior to MD +/- Venofer  in 1-2 weeks. Thanks.   zy

## 2023-12-14 ENCOUNTER — Encounter: Payer: Self-pay | Admitting: Oncology

## 2023-12-22 ENCOUNTER — Telehealth: Payer: Self-pay | Admitting: Oncology

## 2023-12-22 NOTE — Telephone Encounter (Signed)
 Patients daughter Olam called to see if she will need new labs prior to her visit with Dr. Babara due to having labs the week of 7/20. Please call her to let her know if she needs to keep the labs [phone]

## 2023-12-22 NOTE — Telephone Encounter (Signed)
 Dr. Babara will use iron  labs from 7/22. Please cancel lab on 8/8 and notify please. Thanks

## 2023-12-24 ENCOUNTER — Inpatient Hospital Stay

## 2023-12-28 ENCOUNTER — Encounter: Payer: Self-pay | Admitting: Oncology

## 2023-12-28 ENCOUNTER — Inpatient Hospital Stay

## 2023-12-28 ENCOUNTER — Inpatient Hospital Stay: Attending: Oncology | Admitting: Oncology

## 2023-12-28 VITALS — BP 129/69 | HR 85 | Temp 99.4°F | Resp 18 | Wt 195.9 lb

## 2023-12-28 VITALS — BP 124/69 | HR 78

## 2023-12-28 DIAGNOSIS — Z79899 Other long term (current) drug therapy: Secondary | ICD-10-CM | POA: Diagnosis not present

## 2023-12-28 DIAGNOSIS — Z170 Estrogen receptor positive status [ER+]: Secondary | ICD-10-CM | POA: Insufficient documentation

## 2023-12-28 DIAGNOSIS — C50412 Malignant neoplasm of upper-outer quadrant of left female breast: Secondary | ICD-10-CM | POA: Diagnosis not present

## 2023-12-28 DIAGNOSIS — D631 Anemia in chronic kidney disease: Secondary | ICD-10-CM

## 2023-12-28 DIAGNOSIS — N1832 Chronic kidney disease, stage 3b: Secondary | ICD-10-CM | POA: Diagnosis not present

## 2023-12-28 DIAGNOSIS — D508 Other iron deficiency anemias: Secondary | ICD-10-CM

## 2023-12-28 MED ORDER — IRON SUCROSE 20 MG/ML IV SOLN
200.0000 mg | Freq: Once | INTRAVENOUS | Status: AC
  Administered 2023-12-28 (×2): 200 mg via INTRAVENOUS
  Filled 2023-12-28: qty 10

## 2023-12-28 NOTE — Assessment & Plan Note (Signed)
#  Stage IA breast cancer, declined adjuvant aromatase inhibitor.  Clinically she is doing well. I recommend patient to continue mammogram annually-managed by primary care provider.
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
## 2023-12-28 NOTE — Assessment & Plan Note (Signed)
 Labs are reviewed and discussed with patient. Lab Results  Component Value Date   HGB 9.9 (L) 09/07/2023   TIBC 349 08/05/2022   IRONPCTSAT 13 08/05/2022   FERRITIN 20 08/05/2022    Recommend Venofer  weekly x 4.

## 2024-01-03 ENCOUNTER — Other Ambulatory Visit: Payer: Self-pay | Admitting: Family Medicine

## 2024-01-03 ENCOUNTER — Inpatient Hospital Stay

## 2024-01-03 VITALS — BP 150/61 | HR 56 | Temp 99.1°F | Resp 18

## 2024-01-03 DIAGNOSIS — Z79899 Other long term (current) drug therapy: Secondary | ICD-10-CM | POA: Diagnosis not present

## 2024-01-03 DIAGNOSIS — Z170 Estrogen receptor positive status [ER+]: Secondary | ICD-10-CM | POA: Diagnosis not present

## 2024-01-03 DIAGNOSIS — D508 Other iron deficiency anemias: Secondary | ICD-10-CM

## 2024-01-03 DIAGNOSIS — C50412 Malignant neoplasm of upper-outer quadrant of left female breast: Secondary | ICD-10-CM | POA: Diagnosis not present

## 2024-01-03 MED ORDER — IRON SUCROSE 20 MG/ML IV SOLN
200.0000 mg | Freq: Once | INTRAVENOUS | Status: AC
  Administered 2024-01-03: 200 mg via INTRAVENOUS
  Filled 2024-01-03: qty 10

## 2024-01-03 NOTE — Patient Instructions (Signed)

## 2024-01-06 DIAGNOSIS — H353211 Exudative age-related macular degeneration, right eye, with active choroidal neovascularization: Secondary | ICD-10-CM | POA: Diagnosis not present

## 2024-01-06 DIAGNOSIS — H34832 Tributary (branch) retinal vein occlusion, left eye, with macular edema: Secondary | ICD-10-CM | POA: Diagnosis not present

## 2024-01-12 ENCOUNTER — Inpatient Hospital Stay

## 2024-01-12 ENCOUNTER — Other Ambulatory Visit: Payer: Self-pay | Admitting: Family Medicine

## 2024-01-12 ENCOUNTER — Other Ambulatory Visit: Payer: Self-pay | Admitting: Oncology

## 2024-01-12 VITALS — BP 150/97 | HR 66 | Temp 97.9°F | Resp 16

## 2024-01-12 DIAGNOSIS — Z170 Estrogen receptor positive status [ER+]: Secondary | ICD-10-CM | POA: Diagnosis not present

## 2024-01-12 DIAGNOSIS — Z79899 Other long term (current) drug therapy: Secondary | ICD-10-CM | POA: Diagnosis not present

## 2024-01-12 DIAGNOSIS — C50412 Malignant neoplasm of upper-outer quadrant of left female breast: Secondary | ICD-10-CM | POA: Diagnosis not present

## 2024-01-12 DIAGNOSIS — D508 Other iron deficiency anemias: Secondary | ICD-10-CM

## 2024-01-12 MED ORDER — IRON SUCROSE 20 MG/ML IV SOLN
200.0000 mg | Freq: Once | INTRAVENOUS | Status: AC
  Administered 2024-01-12: 200 mg via INTRAVENOUS
  Filled 2024-01-12: qty 10

## 2024-01-12 NOTE — Patient Instructions (Signed)

## 2024-01-13 NOTE — Telephone Encounter (Signed)
 Last filled on 08/04/23 #90 tab/ 0 refills   Last OV was a DM f/u on 11/01/23

## 2024-01-20 ENCOUNTER — Ambulatory Visit
Admission: RE | Admit: 2024-01-20 | Discharge: 2024-01-20 | Disposition: A | Discharge status: Home / Self Care | Source: Ambulatory Visit | Attending: Family Medicine | Admitting: Family Medicine

## 2024-01-20 DIAGNOSIS — Z1231 Encounter for screening mammogram for malignant neoplasm of breast: Secondary | ICD-10-CM | POA: Insufficient documentation

## 2024-01-20 DIAGNOSIS — E2839 Other primary ovarian failure: Secondary | ICD-10-CM | POA: Diagnosis not present

## 2024-01-20 DIAGNOSIS — M8589 Other specified disorders of bone density and structure, multiple sites: Secondary | ICD-10-CM | POA: Diagnosis not present

## 2024-01-20 DIAGNOSIS — Z780 Asymptomatic menopausal state: Secondary | ICD-10-CM | POA: Diagnosis not present

## 2024-01-23 ENCOUNTER — Ambulatory Visit: Payer: Self-pay | Admitting: Family Medicine

## 2024-01-25 ENCOUNTER — Inpatient Hospital Stay: Attending: Oncology

## 2024-01-25 VITALS — BP 155/64 | HR 70 | Temp 98.8°F | Resp 17

## 2024-01-25 DIAGNOSIS — D631 Anemia in chronic kidney disease: Secondary | ICD-10-CM | POA: Insufficient documentation

## 2024-01-25 DIAGNOSIS — N189 Chronic kidney disease, unspecified: Secondary | ICD-10-CM | POA: Diagnosis present

## 2024-01-25 DIAGNOSIS — D508 Other iron deficiency anemias: Secondary | ICD-10-CM

## 2024-01-25 MED ORDER — IRON SUCROSE 20 MG/ML IV SOLN
200.0000 mg | Freq: Once | INTRAVENOUS | Status: AC
  Administered 2024-01-25: 200 mg via INTRAVENOUS
  Filled 2024-01-25: qty 10

## 2024-01-25 MED ORDER — SODIUM CHLORIDE 0.9% FLUSH
10.0000 mL | Freq: Once | INTRAVENOUS | Status: AC | PRN
  Administered 2024-01-25: 10 mL
  Filled 2024-01-25: qty 10

## 2024-01-25 NOTE — Progress Notes (Signed)
 Patient tolerated Venofer  infusion well, no questions/concerns voiced. Patient agreed to be monitored for 15 min post transfusion. Patient stable at discharge. VSS. AVS given.

## 2024-01-25 NOTE — Patient Instructions (Signed)

## 2024-02-24 ENCOUNTER — Other Ambulatory Visit: Payer: Self-pay | Admitting: Family Medicine

## 2024-03-03 ENCOUNTER — Other Ambulatory Visit: Payer: Self-pay | Admitting: Family Medicine

## 2024-03-03 DIAGNOSIS — F32A Depression, unspecified: Secondary | ICD-10-CM

## 2024-03-07 DIAGNOSIS — R809 Proteinuria, unspecified: Secondary | ICD-10-CM | POA: Diagnosis not present

## 2024-03-07 DIAGNOSIS — R600 Localized edema: Secondary | ICD-10-CM | POA: Diagnosis not present

## 2024-03-07 DIAGNOSIS — D631 Anemia in chronic kidney disease: Secondary | ICD-10-CM | POA: Diagnosis not present

## 2024-03-07 DIAGNOSIS — N281 Cyst of kidney, acquired: Secondary | ICD-10-CM | POA: Diagnosis not present

## 2024-03-07 DIAGNOSIS — N184 Chronic kidney disease, stage 4 (severe): Secondary | ICD-10-CM | POA: Diagnosis not present

## 2024-03-07 DIAGNOSIS — N2581 Secondary hyperparathyroidism of renal origin: Secondary | ICD-10-CM | POA: Diagnosis not present

## 2024-03-07 DIAGNOSIS — I10 Essential (primary) hypertension: Secondary | ICD-10-CM | POA: Diagnosis not present

## 2024-03-07 DIAGNOSIS — I129 Hypertensive chronic kidney disease with stage 1 through stage 4 chronic kidney disease, or unspecified chronic kidney disease: Secondary | ICD-10-CM | POA: Diagnosis not present

## 2024-03-14 DIAGNOSIS — R809 Proteinuria, unspecified: Secondary | ICD-10-CM | POA: Diagnosis not present

## 2024-03-14 DIAGNOSIS — D631 Anemia in chronic kidney disease: Secondary | ICD-10-CM | POA: Diagnosis not present

## 2024-03-14 DIAGNOSIS — I129 Hypertensive chronic kidney disease with stage 1 through stage 4 chronic kidney disease, or unspecified chronic kidney disease: Secondary | ICD-10-CM | POA: Diagnosis not present

## 2024-03-14 DIAGNOSIS — I10 Essential (primary) hypertension: Secondary | ICD-10-CM | POA: Diagnosis not present

## 2024-03-14 DIAGNOSIS — N281 Cyst of kidney, acquired: Secondary | ICD-10-CM | POA: Diagnosis not present

## 2024-03-14 DIAGNOSIS — N184 Chronic kidney disease, stage 4 (severe): Secondary | ICD-10-CM | POA: Diagnosis not present

## 2024-03-14 DIAGNOSIS — R600 Localized edema: Secondary | ICD-10-CM | POA: Diagnosis not present

## 2024-03-14 DIAGNOSIS — N2581 Secondary hyperparathyroidism of renal origin: Secondary | ICD-10-CM | POA: Diagnosis not present

## 2024-03-16 DIAGNOSIS — H43813 Vitreous degeneration, bilateral: Secondary | ICD-10-CM | POA: Diagnosis not present

## 2024-03-16 DIAGNOSIS — H35033 Hypertensive retinopathy, bilateral: Secondary | ICD-10-CM | POA: Diagnosis not present

## 2024-03-16 DIAGNOSIS — H35372 Puckering of macula, left eye: Secondary | ICD-10-CM | POA: Diagnosis not present

## 2024-03-16 DIAGNOSIS — H34832 Tributary (branch) retinal vein occlusion, left eye, with macular edema: Secondary | ICD-10-CM | POA: Diagnosis not present

## 2024-03-16 DIAGNOSIS — H353211 Exudative age-related macular degeneration, right eye, with active choroidal neovascularization: Secondary | ICD-10-CM | POA: Diagnosis not present

## 2024-03-16 DIAGNOSIS — H35412 Lattice degeneration of retina, left eye: Secondary | ICD-10-CM | POA: Diagnosis not present

## 2024-03-27 ENCOUNTER — Inpatient Hospital Stay: Attending: Oncology

## 2024-03-27 DIAGNOSIS — N189 Chronic kidney disease, unspecified: Secondary | ICD-10-CM | POA: Insufficient documentation

## 2024-03-27 DIAGNOSIS — D631 Anemia in chronic kidney disease: Secondary | ICD-10-CM | POA: Insufficient documentation

## 2024-03-27 DIAGNOSIS — I129 Hypertensive chronic kidney disease with stage 1 through stage 4 chronic kidney disease, or unspecified chronic kidney disease: Secondary | ICD-10-CM | POA: Diagnosis present

## 2024-03-27 DIAGNOSIS — Z853 Personal history of malignant neoplasm of breast: Secondary | ICD-10-CM | POA: Diagnosis not present

## 2024-03-27 DIAGNOSIS — N1832 Chronic kidney disease, stage 3b: Secondary | ICD-10-CM

## 2024-03-27 DIAGNOSIS — Z923 Personal history of irradiation: Secondary | ICD-10-CM | POA: Insufficient documentation

## 2024-03-27 LAB — IRON AND TIBC
Iron: 50 ug/dL (ref 28–170)
Saturation Ratios: 18 % (ref 10.4–31.8)
TIBC: 283 ug/dL (ref 250–450)
UIBC: 233 ug/dL

## 2024-03-27 LAB — CBC WITH DIFFERENTIAL (CANCER CENTER ONLY)
Abs Immature Granulocytes: 0.06 K/uL (ref 0.00–0.07)
Basophils Absolute: 0 K/uL (ref 0.0–0.1)
Basophils Relative: 1 %
Eosinophils Absolute: 0.2 K/uL (ref 0.0–0.5)
Eosinophils Relative: 2 %
HCT: 33.2 % — ABNORMAL LOW (ref 36.0–46.0)
Hemoglobin: 11.1 g/dL — ABNORMAL LOW (ref 12.0–15.0)
Immature Granulocytes: 1 %
Lymphocytes Relative: 24 %
Lymphs Abs: 1.8 K/uL (ref 0.7–4.0)
MCH: 31.2 pg (ref 26.0–34.0)
MCHC: 33.4 g/dL (ref 30.0–36.0)
MCV: 93.3 fL (ref 80.0–100.0)
Monocytes Absolute: 0.6 K/uL (ref 0.1–1.0)
Monocytes Relative: 8 %
Neutro Abs: 4.8 K/uL (ref 1.7–7.7)
Neutrophils Relative %: 64 %
Platelet Count: 297 K/uL (ref 150–400)
RBC: 3.56 MIL/uL — ABNORMAL LOW (ref 3.87–5.11)
RDW: 13.4 % (ref 11.5–15.5)
WBC Count: 7.5 K/uL (ref 4.0–10.5)
nRBC: 0 % (ref 0.0–0.2)

## 2024-03-27 LAB — RETIC PANEL
Immature Retic Fract: 13.1 % (ref 2.3–15.9)
RBC.: 3.54 MIL/uL — ABNORMAL LOW (ref 3.87–5.11)
Retic Count, Absolute: 57.3 K/uL (ref 19.0–186.0)
Retic Ct Pct: 1.6 % (ref 0.4–3.1)
Reticulocyte Hemoglobin: 33.9 pg (ref >27.9)

## 2024-03-27 LAB — FERRITIN: Ferritin: 135 ng/mL (ref 11–307)

## 2024-03-28 ENCOUNTER — Other Ambulatory Visit: Payer: Self-pay | Admitting: Family Medicine

## 2024-03-29 ENCOUNTER — Inpatient Hospital Stay (HOSPITAL_BASED_OUTPATIENT_CLINIC_OR_DEPARTMENT_OTHER): Admitting: Oncology

## 2024-03-29 ENCOUNTER — Encounter: Payer: Self-pay | Admitting: Oncology

## 2024-03-29 ENCOUNTER — Inpatient Hospital Stay

## 2024-03-29 VITALS — BP 152/85 | HR 66

## 2024-03-29 VITALS — BP 144/68 | HR 77 | Temp 99.1°F | Resp 18 | Ht 62.0 in | Wt 195.2 lb

## 2024-03-29 DIAGNOSIS — N1832 Chronic kidney disease, stage 3b: Secondary | ICD-10-CM

## 2024-03-29 DIAGNOSIS — C50412 Malignant neoplasm of upper-outer quadrant of left female breast: Secondary | ICD-10-CM

## 2024-03-29 DIAGNOSIS — Z923 Personal history of irradiation: Secondary | ICD-10-CM | POA: Diagnosis not present

## 2024-03-29 DIAGNOSIS — I129 Hypertensive chronic kidney disease with stage 1 through stage 4 chronic kidney disease, or unspecified chronic kidney disease: Secondary | ICD-10-CM | POA: Diagnosis not present

## 2024-03-29 DIAGNOSIS — D631 Anemia in chronic kidney disease: Secondary | ICD-10-CM | POA: Diagnosis not present

## 2024-03-29 DIAGNOSIS — Z853 Personal history of malignant neoplasm of breast: Secondary | ICD-10-CM | POA: Diagnosis not present

## 2024-03-29 DIAGNOSIS — Z170 Estrogen receptor positive status [ER+]: Secondary | ICD-10-CM | POA: Diagnosis not present

## 2024-03-29 DIAGNOSIS — N189 Chronic kidney disease, unspecified: Secondary | ICD-10-CM | POA: Diagnosis not present

## 2024-03-29 DIAGNOSIS — D508 Other iron deficiency anemias: Secondary | ICD-10-CM

## 2024-03-29 MED ORDER — IRON SUCROSE 20 MG/ML IV SOLN
200.0000 mg | Freq: Once | INTRAVENOUS | Status: AC
  Administered 2024-03-29: 200 mg via INTRAVENOUS

## 2024-03-29 MED ORDER — SODIUM CHLORIDE 0.9% FLUSH
10.0000 mL | Freq: Once | INTRAVENOUS | Status: AC | PRN
  Administered 2024-03-29: 10 mL
  Filled 2024-03-29: qty 10

## 2024-03-29 NOTE — Assessment & Plan Note (Addendum)
 Labs are reviewed and discussed with patient. Lab Results  Component Value Date   HGB 11.1 (L) 03/27/2024   TIBC 283 03/27/2024   IRONPCTSAT 18 03/27/2024   FERRITIN 135 03/27/2024    Recommend Venofer  x1

## 2024-03-29 NOTE — Assessment & Plan Note (Addendum)
#  Stage IA breast cancer,previously declined adjuvant aromatase inhibitor.  Clinically she is doing well. I recommend patient to continue mammogram annually-managed by primary care provider.
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
## 2024-05-24 ENCOUNTER — Other Ambulatory Visit: Payer: Self-pay | Admitting: Family Medicine

## 2024-05-25 NOTE — Telephone Encounter (Signed)
 Last filled on 05/14/23 # 30 g/ 1 refills   Last OV was on 11/01/23, no future

## 2024-06-02 ENCOUNTER — Other Ambulatory Visit: Payer: Self-pay | Admitting: Family Medicine

## 2024-06-02 DIAGNOSIS — F419 Anxiety disorder, unspecified: Secondary | ICD-10-CM

## 2024-06-07 ENCOUNTER — Ambulatory Visit: Payer: Medicare Other

## 2024-06-09 ENCOUNTER — Ambulatory Visit

## 2024-06-09 VITALS — Ht 63.0 in | Wt 188.0 lb

## 2024-06-09 DIAGNOSIS — Z0000 Encounter for general adult medical examination without abnormal findings: Secondary | ICD-10-CM

## 2024-06-09 NOTE — Patient Instructions (Addendum)
 Ann Cummings,  Thank you for taking the time for your Medicare Wellness Visit. I appreciate your continued commitment to your health goals. Please review the care plan we discussed, and feel free to reach out if I can assist you further.  Please note that Annual Wellness Visits do not include a physical exam. Some assessments may be limited, especially if the visit was conducted virtually. If needed, we may recommend an in-person follow-up with your provider.  Ongoing Care Seeing your primary care provider every 3 to 6 months helps us  monitor your health and provide consistent, personalized care.   Referrals If a referral was made during today's visit and you haven't received any updates within two weeks, please contact the referred provider directly to check on the status.  Recommended Screenings:  Health Maintenance  Topic Date Due   Zoster (Shingles) Vaccine (1 of 2) Never done   Flu Shot  12/17/2023   Hemoglobin A1C  03/08/2024   DTaP/Tdap/Td vaccine (3 - Tdap) 09/06/2024*   COVID-19 Vaccine (3 - Pfizer risk series) 09/22/2025*   Kidney health urinalysis for diabetes  04/15/2027*   Yearly kidney function blood test for diabetes  09/06/2024   Breast Cancer Screening  01/19/2025   Medicare Annual Wellness Visit  06/09/2025   Pneumococcal Vaccine for age over 75  Completed   Osteoporosis screening with Bone Density Scan  Completed   Meningitis B Vaccine  Aged Out   Complete foot exam   Discontinued   Eye exam for diabetics  Discontinued   Colon Cancer Screening  Discontinued  *Topic was postponed. The date shown is not the original due date.       06/07/2023    9:09 AM  Advanced Directives  Does Patient Have a Medical Advance Directive? Yes  Type of Estate Agent of [place];Living will  Copy of Healthcare Power of Attorney in Chart? No - copy requested    Vision: Annual vision screenings are recommended for early detection of glaucoma, cataracts, and  diabetic retinopathy. These exams can also reveal signs of chronic conditions such as diabetes and high blood pressure.  Dental: Annual dental screenings help detect early signs of oral cancer, gum disease, and other conditions linked to overall health, including heart disease and diabetes.
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
## 2024-06-16 ENCOUNTER — Telehealth: Payer: Self-pay | Admitting: Family Medicine

## 2024-06-16 NOTE — Telephone Encounter (Signed)
 Envelop was sealed shut so placed it in PCP's inbox for review

## 2024-06-16 NOTE — Telephone Encounter (Signed)
 Duplicate note

## 2024-06-16 NOTE — Telephone Encounter (Signed)
 Olam dropped off medicine request on behalf of mom placed in Dr Graham inbox at front desk

## 2024-06-16 NOTE — Telephone Encounter (Signed)
 Copied from CRM [id number]. Topic: General - Other >> Jun 16, 2024  9:14 AM Chasity T wrote: Reason for CRM: Olam daughter of patient is calling to advise she will be coming by the office today to drop a letter off for Dr Randeen regarding her mothers insurance needing documentation on a medication that she needs in order for them to continue to pay for it. In the letter it needs to state why she is on the medication and what it is for. She states she will be there before or around lunch time today.

## 2024-06-16 NOTE — Telephone Encounter (Signed)
 Copied from CRM [id number]. Topic: General - Other >> Jun 16, 2024  9:14 AM Chasity T wrote: Reason for CRM: Olam daughter of patient is calling to advise she will be coming by the office today to drop a letter off for Dr Randeen regarding her mothers insurance needing documentation on a medication that she needs in order for them to continue to pay for it. In the letter it needs to state why she is on the medication and what it is for. She states she will be there before or around lunch time today.

## 2024-06-19 ENCOUNTER — Ambulatory Visit: Admitting: Family Medicine

## 2024-06-21 ENCOUNTER — Telehealth: Payer: Self-pay | Admitting: Family Medicine

## 2024-06-21 NOTE — Telephone Encounter (Signed)
 Copied from CRM [id number]. Topic: General - Other >> Jun 16, 2024  9:14 AM Chasity T wrote: Reason for CRM: Olam daughter of patient is calling to advise she will be coming by the office today to drop a letter off for Dr Randeen regarding her mothers insurance needing documentation on a medication that she needs in order for them to continue to pay for it. In the letter it needs to state why she is on the medication and what it is for. She states she will be there before or around lunch time today.

## 2024-06-21 NOTE — Telephone Encounter (Signed)
 Duplicate message

## 2024-06-26 ENCOUNTER — Ambulatory Visit: Admitting: Family Medicine

## 2024-09-25 ENCOUNTER — Inpatient Hospital Stay

## 2024-09-27 ENCOUNTER — Inpatient Hospital Stay

## 2024-09-27 ENCOUNTER — Inpatient Hospital Stay: Admitting: Oncology

## 2025-06-14 ENCOUNTER — Ambulatory Visit
